# Patient Record
Sex: Male | Born: 1935 | Race: White | Hispanic: No | Marital: Married | Smoking: Never smoker
Health system: Southern US, Community
[De-identification: ages and names within clinical notes are randomized; demographics above are authoritative.]

## PROBLEM LIST (undated history)

## (undated) DIAGNOSIS — I517 Cardiomegaly: Secondary | ICD-10-CM

## (undated) DIAGNOSIS — I5181 Takotsubo syndrome: Secondary | ICD-10-CM

## (undated) DIAGNOSIS — R55 Syncope and collapse: Secondary | ICD-10-CM

## (undated) DIAGNOSIS — E7800 Pure hypercholesterolemia, unspecified: Secondary | ICD-10-CM

## (undated) DIAGNOSIS — I513 Intracardiac thrombosis, not elsewhere classified: Secondary | ICD-10-CM

## (undated) HISTORY — DX: Pure hypercholesterolemia, unspecified: E78.00

## (undated) HISTORY — DX: Cardiomegaly: I51.7

## (undated) HISTORY — DX: Syncope and collapse: R55

---

## 1998-10-25 ENCOUNTER — Ambulatory Visit (HOSPITAL_COMMUNITY): Admission: RE | Admit: 1998-10-25 | Discharge: 1998-10-25 | Payer: Self-pay | Admitting: Gastroenterology

## 2001-05-30 ENCOUNTER — Encounter: Payer: Self-pay | Admitting: Emergency Medicine

## 2001-05-30 ENCOUNTER — Emergency Department (HOSPITAL_COMMUNITY): Admission: EM | Admit: 2001-05-30 | Discharge: 2001-05-30 | Payer: Self-pay | Admitting: Emergency Medicine

## 2001-06-05 ENCOUNTER — Ambulatory Visit (HOSPITAL_COMMUNITY): Admission: RE | Admit: 2001-06-05 | Discharge: 2001-06-05 | Payer: Self-pay | Admitting: Emergency Medicine

## 2001-06-05 ENCOUNTER — Encounter: Payer: Self-pay | Admitting: Emergency Medicine

## 2001-12-22 ENCOUNTER — Ambulatory Visit (HOSPITAL_COMMUNITY): Admission: RE | Admit: 2001-12-22 | Discharge: 2001-12-22 | Payer: Self-pay | Admitting: Gastroenterology

## 2005-11-27 ENCOUNTER — Inpatient Hospital Stay (HOSPITAL_COMMUNITY): Admission: EM | Admit: 2005-11-27 | Discharge: 2005-11-30 | Payer: Self-pay | Admitting: Emergency Medicine

## 2005-11-28 ENCOUNTER — Encounter (INDEPENDENT_AMBULATORY_CARE_PROVIDER_SITE_OTHER): Payer: Self-pay | Admitting: *Deleted

## 2005-12-03 ENCOUNTER — Ambulatory Visit: Payer: Self-pay | Admitting: Internal Medicine

## 2007-01-24 ENCOUNTER — Encounter: Admission: RE | Admit: 2007-01-24 | Discharge: 2007-01-24 | Payer: Self-pay | Admitting: Gastroenterology

## 2010-06-30 NOTE — H&P (Signed)
NAMELESTON, SCHUELLER             ACCOUNT NO.:  [id number]   MEDICAL RECORD NO.:  [id number]          PATIENT TYPE:  INP   LOCATION:  1432                         FACILITY:  [facility]   PHYSICIAN:  Iva Boop, MD,FACGDATE OF BIRTH:  [birth year]   DATE OF ADMISSION:  11/27/2005  DATE OF DISCHARGE:                                HISTORY & PHYSICAL   CHIEF COMPLAINTS:  Acute upper abdominal pain x36 hours associated with  jaundice.   HISTORY:  Mr. Woolverton is a 77 year old white male,a primary patient of Dr.  Dossie Arbour, generally healthy with history of hypertension and  ureterolithiasis.  He has no prior surgeries.   The patient had acute onset on Monday, October 15, with right-sided  abdominal pain and decrease in his appetite, initially did not have any  nausea, vomiting or diarrhea, but has become nauseated today.  His wife says  that he had an episode of freezing and then sweating, but no documented  fever at home.  They have noted dark urine over the past 24 hours or so.  He  said that he had been feeling fine and in his usual state of health until  acute onset of his symptoms.  He has not been on any new medications or  antibiotics, no known exposures, etc.  He did get a flu shot interestingly  the day before his symptoms started.   In the emergency room, WBC of 11.6, hemoglobin 15.7, hematocrit of 46.2,  platelets 295, 000; pro time 14.3, INR of 1.1; potassium 4.3, BUN 12,  creatinine 1.2, total bilirubin 7, alk phos 160, OT 354, PT 687, lipase  within normal limits at 39.  Abdominal ultrasound showed a mobile stone in  the gallbladder, no ductal dilation, otherwise negative exam.  At this time  he is admitted for supportive management, pain control and further  diagnostic workup.   CURRENT MEDICATIONS:  Baby aspirin daily.   ALLERGIES:  NO KNOWN DRUG ALLERGIES.   PAST HISTORY:  Pertinent for hypertension and ureterolithiasis.   FAMILY HISTORY:  Father deceased  secondary to a brain tumor.   SOCIAL HISTORY:  The patient is married, retired, has been living in  [place] for 32 years.  He smokes an occasional cigar, EtOH -- 1 beer  daily.   REVIEW OF SYSTEMS:  CARDIOVASCULAR:  Denies any chest pain or anginal  symptoms.  PULMONARY:  Negative for cough, shortness of breath or sputum  production.  GENITOURINARY:  Pertinent for dark urine.  No dysuria or  frequency.  MUSCULOSKELETAL:  Negative.  EXTREMITIES:  No problems with  edema.  SKIN:  He has noticed yellowing of his skin, no pruritus.  NEUROLOGIC:  Negative.  All other systems are negative.   PHYSICAL EXAM:  GENERAL:  A well-developed, jaundiced white male, alert,  oriented, uncomfortable, nauseated.  VITAL SIGNS:  Blood pressure 130/71, pulse is 95, SATS 97% on room air.  Temperature is 98.8.  HEENT:  Nontraumatic, normocephalic.  EOMI.  PERLA.  Sclerae anicteric.  NECK:  Supple without nodes.  CARDIOVASCULAR:  Regular rate and rhythm with no murmur, rub or gallop.  PULMONARY:  Clear to A&P.  ABDOMEN:  Soft.  He is mildly tender in the right upper quadrant.  There is  no palpable mass or hepatosplenomegaly, no guarding.  Bowel sounds are  active.  RECTAL:  Exam is not done at this time.  SKIN:  Jaundiced.  There are no lesions or rashes.  EXTREMITIES:  No clubbing, cyanosis or edema.  NEUROLOGIC:  Grossly nonfocal.   IMPRESSION:  1. [age]-year-old white male with acute onset of right-sided      abdominal pain x36 hours with jaundice and negative ultrasound, rule      out choledocholithiasis, rule out ampullary or pancreatic lesion, rule      out acute hepatotoxicity with capsular distension, rule out      cholecystitis.  2. History of hypertension.  3. Ureterolithiasis.   PLAN:  The patient is admitted to the service of Dr. Stan Head for IV  fluid hydration, pain control and antiemetics.  We will check CT scan of the  abdomen and pelvis first and then further plans  pending CT.  If CT is  unrevealing, we will need HIDA scan.     ______________________________  Mike Gip, PA-C      Iva Boop, MD,FACG  Electronically Signed    AE/MEDQ  D:  11/28/2005  T:  11/29/2005  Job:  [id number]   cc:   Barry Dienes. Eloise Harman, M.D.  Fax: [phone]

## 2010-06-30 NOTE — Op Note (Signed)
NAMETRENELL, CONCANNON             ACCOUNT NO.:  [id number]   MEDICAL RECORD NO.:  [id number]          PATIENT TYPE:  INP   LOCATION:  1432                         FACILITY:  [facility]   PHYSICIAN:  Lebron Conners, M.D.   DATE OF BIRTH:  02-18-1930   DATE OF PROCEDURE:  11/28/2005  DATE OF DISCHARGE:                                 OPERATIVE REPORT   PREOPERATIVE DIAGNOSIS:  Cholelithiasis and possible acute cholecystitis,  possible choledocholithiasis.   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   PROCEDURE:  Laparoscopic cholecystectomy with operative cholangiogram.   SURGEON:  Lebron Conners, M.D.   ASSISTANT:  Currie Paris, M.D.   ANESTHESIA:  General and local.   SPECIMEN:  Gallbladder.   BLOOD LOSS:  About 200 mL.   COMPLICATIONS:  None.   PROCEDURE:  After the patient was monitored and asleep and had routine  preparation and draping of the abdomen, I infiltrated the area below the  umbilicus with local anesthetic, made about a 3 cm transverse incision and  then about 2 cm of midline incision and bluntly entered the peritoneal  cavity.  I placed a 0 Vicryl pursestring suture in the fascia and secured a  Hassan cannula and put in a laparoscope.  I found evidence of considerable  inflammation in the upper abdomen with a lot of adhesion of the omentum to  the undersurface of the gallbladder and with adhesion of the liver to the  anterior abdominal wall and adhesions of omentum to the undersurface of the  liver.  I first put in two 5 mm ports in the right lateral abdomen under  direct view and took down the adhesions to gain better view of the right  upper quadrant.  I then put in an 11 mm port in the epigastrium.  Dissecting  the adhesions away the gallbladder, it was noted the patient had a severe  acute cholecystitis.  I decompressed the gallbladder with a  suction  aspirator and then was able to grasp the fundus and elevated it.  Because of  the severe degree of inflammation and lack of mobility of the structures, we  needed angled viewing scope and then we had pretty good vision.  I took down  the adhesions until I found the infundibulum of the gallbladder.  It was  very inflamed and thickened and contained a large gallstone.  We were able  to grasp it and pull it laterally and I then followed it down until I saw  the cystic duct emerging from the infundibulum and I saw the cystic artery  crossing the triangle of Calot.  I clipped and divided the cystic artery and  then placed a clip on the cystic duct as it emerged from the infundibulum  and then made a small nick in it distal to that.  It was filled with  necrotic looking debris but I did not see any stones.  I put in a Cook  cholangiogram catheter and secured that with a clip but there was leakage of  the saline as I  tried an injection.  When I put one clip distally, that did  not do good and when I removed that, there was some leakage at that site.  I  then got a Reddick cholangiogram catheter and put that down a little farther  and partially inflated the balloon and put a clip above that and had a  secure seal.  I then performed a fluoroscopic cholangiogram.  The distal  common bile duct appeared normal and there was nice free flow into the  duodenum.  Proximal to the entry of the cystic duct into the common duct,  there was a smooth narrowing of the common hepatic duct immediately adjacent  to the infundibulum of the gallbladder.  I believe that the compression was  caused by severe inflammation in the stone within the gallbladder, producing  obstruction of the common hepatic duct.  I noted that as I increased  pressure, there was some distensibility of that area and that further lent  credence to that area.  After performance of the cholangiogram, I put the  laparoscope back in and  removed the clip and the cholangiogram catheter and  then clipped the distal cystic duct with three clips.  After dividing the  cystic duct, I removed the gallbladder from the liver with cautery.  It was  extremely thick-walled and somewhat difficult to get out, but we were able  to detach it and place it in a plastic pouch.  I believe a left small  portion of the posterior gallbladder wall near the fundus attached to the  liver and I cauterized that.  I cauterized bleeders in the gallbladder  fossa.  I placed a generous patch of Surgicel in the gallbladder fossa.  I  saw that the clips appeared secure.  However, since I was worried about the  integrity of the cystic duct, I decided to put in a drain and brought a 61-  Jamaica Blake drain through the lateral port site, placed it in good position  under the liver and secured it to the skin with a suture.  I then suctioned  out the remaining clots and fluid.  I removed the gallbladder through the  umbilical incision and tied the pursestring suture, then checked to make  sure I had trapped no viscera.  I removed the lateral port under direct view  and then removed the epigastric port.  The sponge, needle and instrument  counts were correct.  I closed all skin incisions with intracuticular 4-0  Vicryl and Steri-Strips.  The patient went to PACU in stable condition.      Lebron Conners, M.D.  Electronically Signed     WB/MEDQ  D:  11/28/2005  T:  11/30/2005  Job:  [id number]   cc:   Barry Dienes. Eloise Harman, M.D.  Fax: [phone]

## 2010-06-30 NOTE — Discharge Summary (Signed)
NAMESTEN, DEMATTEO NO.:  [id number]   MEDICAL RECORD NO.:  [id number]          PATIENT TYPE:  INP   LOCATION:  1432                         FACILITY:  [facility]   PHYSICIAN:  Lebron Conners, M.D.   DATE OF BIRTH:  26-Jun-1930   DATE OF ADMISSION:  11/27/2005  DATE OF DISCHARGE:  11/30/2005                                 DISCHARGE SUMMARY   HISTORY:  Mr. Vandeusen is a 77 year old man who was admitted to the hospital  by [facility] gastroenterology service because of abdominal pain and jaundice.  This was of recent onset.  He had normal clotting studies.  Bilirubin was 7,  alkaline phosphatase 160 and transaminases markedly elevated.  The lipase  was normal.  Ultrasound showed gallstones.  He was felt to have gallstones  and possible common duct stones.  On exam he had mild abdominal tenderness.   HOSPITAL COURSE:  The patient felt better with antibiotics.  I saw him on  the day after admission.  His jaundice was improving.  I recommended that he  undergo a laparoscopic cholecystectomy with cholangiogram.  Dr. Leone Payor  agreed.  The operation took place on November 28, 2005.  I found that on the  cholangiogram, there was evident extrinsic compression of the common bile  duct very likely due to impacted stone in the cystic duct (Mirizzi's  syndrome).  The laparoscopic cholecystectomy went well.  Postoperatively,  the jaundice continued to improve.  He was ready for discharge by the second  postoperative day and was sent home with arrangements made for follow-up in  the office.  He was sent home with a drain in place and was asked to return  to the office in about a week.   DIAGNOSIS:  Cholelithiasis and extrinsic common duct compression with acute  and chronic cholecystitis.   OPERATION:  Laparoscopic cholecystectomy and cholangiogram.   DISCHARGE CONDITION:  Improved.      Lebron Conners, M.D.  Electronically Signed     WB/MEDQ  D:  12/31/2005  T:  12/31/2005   Job:  [id number]   cc:   Iva Boop, MD,FACG  [facility]  [address] [place], [place] [address]

## 2010-06-30 NOTE — Op Note (Signed)
   Adam Leon, Adam Leon                         ACCOUNT NO.:  [id number]   MEDICAL RECORD NO.:  [id number]                   PATIENT TYPE:  AMB   LOCATION:  ENDO                                 [facility]AC[facility]  [facility]   PHYSICIAN:  John C. Madilyn Fireman, M.D.                 DATE OF BIRTH:  08-03-1930   DATE OF PROCEDURE:  12/22/2001  DATE OF DISCHARGE:                                 OPERATIVE REPORT   PROCEDURE:  Colonoscopy.   INDICATIONS FOR PROCEDURE:  Colon polyps on index colonoscopy three years  ago.   DESCRIPTION OF PROCEDURE:  The patient was placed in the left lateral  decubitus position then placed on the pulse monitor with continuous low flow  oxygen delivered by nasal cannula. He was sedated with 40 mg IV Demerol and  4 mg IV Versed. The Olympus video colonoscope was inserted into the rectum  and advanced to the cecum, confirmed by transillumination at McBurney's  point and visualization at the ileocecal valve and appendiceal orifice. The  prep was excellent. The cecum appeared normal with no masses, polyps,  diverticula or other mucosal abnormalities. Within the ascending, transverse  and descending colon there were seen multiple diverticula and no other  abnormalities. The rectum appeared normal and retroflexed view of the anus  revealed no obvious internal hemorrhoids. The colonoscope was then withdrawn  and the patient returned to the recovery room in stable condition. The  patient tolerated the procedure well and there were no immediate  complications.   IMPRESSION:  Diverticulosis, otherwise, normal colonoscopy.   PLAN:  Repeat colonoscopy based on his history of polyps in five years.                                                John C. Madilyn Fireman, M.D.    JCH/MEDQ  D:  12/22/2001  T:  12/22/2001  Job:  [id number]

## 2011-06-11 ENCOUNTER — Other Ambulatory Visit (HOSPITAL_COMMUNITY): Payer: Self-pay | Admitting: Internal Medicine

## 2011-06-11 DIAGNOSIS — R079 Chest pain, unspecified: Secondary | ICD-10-CM

## 2011-06-12 ENCOUNTER — Encounter (HOSPITAL_COMMUNITY): Payer: Self-pay

## 2011-06-18 ENCOUNTER — Encounter: Payer: Self-pay | Admitting: Physician Assistant

## 2011-06-18 ENCOUNTER — Ambulatory Visit (INDEPENDENT_AMBULATORY_CARE_PROVIDER_SITE_OTHER): Payer: Medicare Other | Admitting: Physician Assistant

## 2011-06-18 DIAGNOSIS — R079 Chest pain, unspecified: Secondary | ICD-10-CM

## 2011-06-18 NOTE — Procedures (Signed)
Exercise Treadmill Test  Pre-Exercise Testing Evaluation Rhythm: normal sinus  Rate: 58   PR:  .17 QRS:  .10  QT:  .41 QTc: .40     Test  Exercise Tolerance Test Ordering MD: Ivery Quale  Interpreting MD: Jacolyn Reedy PA-C  Unique Test No: 1  Treadmill:  1  Indication for ETT: chest pain - rule out ischemia  Contraindication to ETT: No   Stress Modality: exercise - treadmill  Cardiac Imaging Performed: non   Protocol: standard Bruce - maximal  Max BP:  171/80  Max MPHR (bpm):  142 85% MPR (bpm):  120  MPHR obtained (bpm):  121 % MPHR obtained:  85  Reached 85% MPHR (min:sec):  4:00 Total Exercise Time (min-sec):  4:10  Workload in METS:  5.9 Borg Scale: 17  Reason ETT Terminated:  legs hurt    ST Segment Analysis At Rest: normal ST segments - no evidence of significant ST depression With Exercise: no evidence of significant ST depression  Other Information Arrhythmia:  No Angina during ETT:  absent (0) Quality of ETT:  diagnostic  ETT Interpretation:  normal - no evidence of ischemia by ST analysis  Comments: Poor exercise tolerance. Exercised 4:10, stopped due to leg pain, fatigue. No chest pain or EKG changes.  Recommendations: Follow up with Dr. Jarold Motto

## 2011-12-17 ENCOUNTER — Encounter: Payer: Self-pay | Admitting: Cardiovascular Disease

## 2012-04-01 ENCOUNTER — Encounter: Payer: Self-pay | Admitting: Cardiovascular Disease

## 2014-11-19 DIAGNOSIS — Z23 Encounter for immunization: Secondary | ICD-10-CM | POA: Diagnosis not present

## 2015-02-23 DIAGNOSIS — R69 Illness, unspecified: Secondary | ICD-10-CM | POA: Diagnosis not present

## 2015-06-27 DIAGNOSIS — E1151 Type 2 diabetes mellitus with diabetic peripheral angiopathy without gangrene: Secondary | ICD-10-CM | POA: Diagnosis not present

## 2015-06-27 DIAGNOSIS — Z125 Encounter for screening for malignant neoplasm of prostate: Secondary | ICD-10-CM | POA: Diagnosis not present

## 2015-06-27 DIAGNOSIS — I10 Essential (primary) hypertension: Secondary | ICD-10-CM | POA: Diagnosis not present

## 2015-06-27 DIAGNOSIS — E784 Other hyperlipidemia: Secondary | ICD-10-CM | POA: Diagnosis not present

## 2015-07-04 DIAGNOSIS — Z1389 Encounter for screening for other disorder: Secondary | ICD-10-CM | POA: Diagnosis not present

## 2015-07-04 DIAGNOSIS — E1151 Type 2 diabetes mellitus with diabetic peripheral angiopathy without gangrene: Secondary | ICD-10-CM | POA: Diagnosis not present

## 2015-07-04 DIAGNOSIS — Z0000 Encounter for general adult medical examination without abnormal findings: Secondary | ICD-10-CM | POA: Diagnosis not present

## 2015-07-04 DIAGNOSIS — I7389 Other specified peripheral vascular diseases: Secondary | ICD-10-CM | POA: Diagnosis not present

## 2015-07-04 DIAGNOSIS — E784 Other hyperlipidemia: Secondary | ICD-10-CM | POA: Diagnosis not present

## 2015-07-04 DIAGNOSIS — R351 Nocturia: Secondary | ICD-10-CM | POA: Diagnosis not present

## 2015-07-04 DIAGNOSIS — I10 Essential (primary) hypertension: Secondary | ICD-10-CM | POA: Diagnosis not present

## 2015-07-04 DIAGNOSIS — Z6821 Body mass index (BMI) 21.0-21.9, adult: Secondary | ICD-10-CM | POA: Diagnosis not present

## 2015-09-06 DIAGNOSIS — R69 Illness, unspecified: Secondary | ICD-10-CM | POA: Diagnosis not present

## 2015-12-22 DIAGNOSIS — R69 Illness, unspecified: Secondary | ICD-10-CM | POA: Diagnosis not present

## 2016-02-02 DIAGNOSIS — Z0000 Encounter for general adult medical examination without abnormal findings: Secondary | ICD-10-CM | POA: Diagnosis not present

## 2016-02-02 DIAGNOSIS — E7800 Pure hypercholesterolemia, unspecified: Secondary | ICD-10-CM | POA: Diagnosis not present

## 2016-02-02 DIAGNOSIS — E119 Type 2 diabetes mellitus without complications: Secondary | ICD-10-CM | POA: Diagnosis not present

## 2016-02-02 DIAGNOSIS — Z6822 Body mass index (BMI) 22.0-22.9, adult: Secondary | ICD-10-CM | POA: Diagnosis not present

## 2016-02-02 DIAGNOSIS — I10 Essential (primary) hypertension: Secondary | ICD-10-CM | POA: Diagnosis not present

## 2016-03-13 DIAGNOSIS — R69 Illness, unspecified: Secondary | ICD-10-CM | POA: Diagnosis not present

## 2016-07-03 DIAGNOSIS — I10 Essential (primary) hypertension: Secondary | ICD-10-CM | POA: Diagnosis not present

## 2016-07-03 DIAGNOSIS — E784 Other hyperlipidemia: Secondary | ICD-10-CM | POA: Diagnosis not present

## 2016-07-03 DIAGNOSIS — Z125 Encounter for screening for malignant neoplasm of prostate: Secondary | ICD-10-CM | POA: Diagnosis not present

## 2016-07-03 DIAGNOSIS — E1151 Type 2 diabetes mellitus with diabetic peripheral angiopathy without gangrene: Secondary | ICD-10-CM | POA: Diagnosis not present

## 2016-07-10 DIAGNOSIS — H6121 Impacted cerumen, right ear: Secondary | ICD-10-CM | POA: Diagnosis not present

## 2016-07-10 DIAGNOSIS — E784 Other hyperlipidemia: Secondary | ICD-10-CM | POA: Diagnosis not present

## 2016-07-10 DIAGNOSIS — I7389 Other specified peripheral vascular diseases: Secondary | ICD-10-CM | POA: Diagnosis not present

## 2016-07-10 DIAGNOSIS — I10 Essential (primary) hypertension: Secondary | ICD-10-CM | POA: Diagnosis not present

## 2016-07-10 DIAGNOSIS — E1151 Type 2 diabetes mellitus with diabetic peripheral angiopathy without gangrene: Secondary | ICD-10-CM | POA: Diagnosis not present

## 2016-07-10 DIAGNOSIS — Z6821 Body mass index (BMI) 21.0-21.9, adult: Secondary | ICD-10-CM | POA: Diagnosis not present

## 2016-07-10 DIAGNOSIS — Z0000 Encounter for general adult medical examination without abnormal findings: Secondary | ICD-10-CM | POA: Diagnosis not present

## 2016-07-10 DIAGNOSIS — N528 Other male erectile dysfunction: Secondary | ICD-10-CM | POA: Diagnosis not present

## 2016-07-10 DIAGNOSIS — Z1389 Encounter for screening for other disorder: Secondary | ICD-10-CM | POA: Diagnosis not present

## 2016-11-27 DIAGNOSIS — Z23 Encounter for immunization: Secondary | ICD-10-CM | POA: Diagnosis not present

## 2017-02-28 DIAGNOSIS — Z7984 Long term (current) use of oral hypoglycemic drugs: Secondary | ICD-10-CM | POA: Diagnosis not present

## 2017-02-28 DIAGNOSIS — Z823 Family history of stroke: Secondary | ICD-10-CM | POA: Diagnosis not present

## 2017-02-28 DIAGNOSIS — G8929 Other chronic pain: Secondary | ICD-10-CM | POA: Diagnosis not present

## 2017-02-28 DIAGNOSIS — E1151 Type 2 diabetes mellitus with diabetic peripheral angiopathy without gangrene: Secondary | ICD-10-CM | POA: Diagnosis not present

## 2017-02-28 DIAGNOSIS — I10 Essential (primary) hypertension: Secondary | ICD-10-CM | POA: Diagnosis not present

## 2017-02-28 DIAGNOSIS — E785 Hyperlipidemia, unspecified: Secondary | ICD-10-CM | POA: Diagnosis not present

## 2017-04-17 DIAGNOSIS — R69 Illness, unspecified: Secondary | ICD-10-CM | POA: Diagnosis not present

## 2017-07-09 DIAGNOSIS — Z125 Encounter for screening for malignant neoplasm of prostate: Secondary | ICD-10-CM | POA: Diagnosis not present

## 2017-07-09 DIAGNOSIS — E7849 Other hyperlipidemia: Secondary | ICD-10-CM | POA: Diagnosis not present

## 2017-07-09 DIAGNOSIS — I10 Essential (primary) hypertension: Secondary | ICD-10-CM | POA: Diagnosis not present

## 2017-07-09 DIAGNOSIS — R82998 Other abnormal findings in urine: Secondary | ICD-10-CM | POA: Diagnosis not present

## 2017-07-09 DIAGNOSIS — E1151 Type 2 diabetes mellitus with diabetic peripheral angiopathy without gangrene: Secondary | ICD-10-CM | POA: Diagnosis not present

## 2017-07-16 DIAGNOSIS — E7849 Other hyperlipidemia: Secondary | ICD-10-CM | POA: Diagnosis not present

## 2017-07-16 DIAGNOSIS — M545 Low back pain: Secondary | ICD-10-CM | POA: Diagnosis not present

## 2017-07-16 DIAGNOSIS — Z1389 Encounter for screening for other disorder: Secondary | ICD-10-CM | POA: Diagnosis not present

## 2017-07-16 DIAGNOSIS — Z0000 Encounter for general adult medical examination without abnormal findings: Secondary | ICD-10-CM | POA: Diagnosis not present

## 2017-07-16 DIAGNOSIS — I10 Essential (primary) hypertension: Secondary | ICD-10-CM | POA: Diagnosis not present

## 2017-07-16 DIAGNOSIS — I7389 Other specified peripheral vascular diseases: Secondary | ICD-10-CM | POA: Diagnosis not present

## 2017-07-16 DIAGNOSIS — Z6822 Body mass index (BMI) 22.0-22.9, adult: Secondary | ICD-10-CM | POA: Diagnosis not present

## 2017-07-16 DIAGNOSIS — E1151 Type 2 diabetes mellitus with diabetic peripheral angiopathy without gangrene: Secondary | ICD-10-CM | POA: Diagnosis not present

## 2017-11-04 DIAGNOSIS — R69 Illness, unspecified: Secondary | ICD-10-CM | POA: Diagnosis not present

## 2017-12-11 ENCOUNTER — Encounter (HOSPITAL_COMMUNITY): Payer: Self-pay | Admitting: Emergency Medicine

## 2017-12-11 ENCOUNTER — Other Ambulatory Visit: Payer: Self-pay

## 2017-12-11 ENCOUNTER — Emergency Department (HOSPITAL_COMMUNITY): Payer: Medicare HMO

## 2017-12-11 ENCOUNTER — Emergency Department (HOSPITAL_COMMUNITY)
Admission: EM | Admit: 2017-12-11 | Discharge: 2017-12-11 | Disposition: A | Discharge status: Home / Self Care | Payer: Medicare HMO | Attending: Emergency Medicine | Admitting: Emergency Medicine

## 2017-12-11 DIAGNOSIS — R531 Weakness: Secondary | ICD-10-CM | POA: Diagnosis not present

## 2017-12-11 DIAGNOSIS — Z7982 Long term (current) use of aspirin: Secondary | ICD-10-CM | POA: Insufficient documentation

## 2017-12-11 DIAGNOSIS — R42 Dizziness and giddiness: Secondary | ICD-10-CM | POA: Insufficient documentation

## 2017-12-11 DIAGNOSIS — Z7984 Long term (current) use of oral hypoglycemic drugs: Secondary | ICD-10-CM | POA: Diagnosis not present

## 2017-12-11 DIAGNOSIS — R202 Paresthesia of skin: Secondary | ICD-10-CM | POA: Diagnosis not present

## 2017-12-11 DIAGNOSIS — R4020 Unspecified coma: Secondary | ICD-10-CM | POA: Diagnosis not present

## 2017-12-11 DIAGNOSIS — Z79899 Other long term (current) drug therapy: Secondary | ICD-10-CM | POA: Diagnosis not present

## 2017-12-11 LAB — BASIC METABOLIC PANEL
ANION GAP: 7 (ref 5–15)
BUN: 16 mg/dL (ref 8–23)
CO2: 26 mmol/L (ref 22–32)
CREATININE: 1.28 mg/dL — AB (ref 0.61–1.24)
Calcium: 9.6 mg/dL (ref 8.9–10.3)
Chloride: 104 mmol/L (ref 98–111)
GFR, EST AFRICAN AMERICAN: 57 mL/min — AB (ref >60)
GFR, EST NON AFRICAN AMERICAN: 49 mL/min — AB (ref >60)
GLUCOSE: 224 mg/dL — AB (ref 70–99)
Potassium: 4.3 mmol/L (ref 3.5–5.1)
Sodium: 137 mmol/L (ref 135–145)

## 2017-12-11 LAB — CBC
HCT: 46 % (ref 39.0–52.0)
Hemoglobin: 14.6 g/dL (ref 13.0–17.0)
MCH: 27.1 pg (ref 26.0–34.0)
MCHC: 31.7 g/dL (ref 30.0–36.0)
MCV: 85.3 fL (ref 80.0–100.0)
Platelets: 322 10*3/uL (ref 150–400)
RBC: 5.39 MIL/uL (ref 4.22–5.81)
RDW: 12.9 % (ref 11.5–15.5)
WBC: 11.7 10*3/uL — ABNORMAL HIGH (ref 4.0–10.5)
nRBC: 0 % (ref 0.0–0.2)

## 2017-12-11 LAB — TROPONIN I: Troponin I: <0.03 ng/mL (ref <0.03)

## 2017-12-11 NOTE — Discharge Instructions (Addendum)
Follow-up with your doctor next week to be rechecked, return to the emergency room if you have any recurrent episodes

## 2017-12-11 NOTE — ED Provider Notes (Signed)
[facility] EMERGENCY DEPARTMENT Provider Note   CSN: [id number] Arrival date & time: 12/11/17  1226     History   Chief Complaint Chief Complaint  Patient presents with  . Dizziness    HPI Adam Leon is a 84 y.o. male.  HPI Pt was at home this morning.  He had already been up walking around for breakfast.  Patient states he went to the bathroom.  After sitting down and going to get up he noticed that his feet felt numb.  Pt went back on the couch when he continue to feel like his feet were tingling and he couldn't stand up.  EMS was called.  This lasted for approximately 30 minutes.  Right now all of his symptoms have resolved.   No trouble with speech, arms,.  No headache.   Past Medical History:  Diagnosis Date  . Hypercholesteremia   . Syncope   . Syncope   . Ventricular hypertrophy     There are no active problems to display for this patient.   History reviewed. No pertinent surgical history.      Home Medications    Prior to Admission medications   Medication Sig Start Date End Date Taking? Authorizing Provider  aspirin 81 MG tablet Take 81 mg by mouth daily.   Yes [provider]  atenolol (TENORMIN) 25 MG tablet Take 25 mg by mouth daily. 09/17/17  Yes [provider]  lisinopril (PRINIVIL,ZESTRIL) 5 MG tablet Take 5 mg by mouth daily. 09/17/17  Yes [provider]  metFORMIN (GLUCOPHAGE-XR) 500 MG 24 hr tablet Take 500 mg by mouth 2 (two) times daily. 09/17/17  Yes [provider]  pravastatin (PRAVACHOL) 40 MG tablet Take 40 mg by mouth daily. 09/17/17  Yes [provider]    Family History No family history on file.  Social History Social History   Tobacco Use  . Smoking status: Not on file  Substance Use Topics  . Alcohol use: Not on file  . Drug use: Not on file     Allergies   Patient has no known allergies.   Review of Systems Review of Systems  Constitutional: Negative  for fever.  Respiratory: Negative for shortness of breath.   Cardiovascular: Negative for chest pain.  Genitourinary: Negative for dysuria.  Neurological: Positive for light-headedness.  All other systems reviewed and are negative.    Physical Exam Updated Vital Signs BP (!) 128/57   Pulse 70   Temp 98.2 F (36.8 C) (Oral)   Resp 19   SpO2 97%   Physical Exam  Constitutional: He is oriented to person, place, and time. He appears well-developed and well-nourished. No distress.  HENT:  Head: Normocephalic and atraumatic.  Right Ear: External ear normal.  Left Ear: External ear normal.  Mouth/Throat: Oropharynx is clear and moist.  Eyes: Conjunctivae are normal. Right eye exhibits no discharge. Left eye exhibits no discharge. No scleral icterus.  Neck: Neck supple. No tracheal deviation present.  Cardiovascular: Normal rate, regular rhythm and intact distal pulses.  Pulmonary/Chest: Effort normal and breath sounds normal. No stridor. No respiratory distress. He has no wheezes. He has no rales.  Abdominal: Soft. Bowel sounds are normal. He exhibits no distension. There is no tenderness. There is no rebound and no guarding.  Musculoskeletal: He exhibits no edema or tenderness.  Normal dorsalis pedis pulse bilaterally, extremities are warm and well-perfused  Neurological: He is alert and oriented to person, place, and time. He has normal  strength. No cranial nerve deficit (no facial droop, extraocular movements intact, no slurred speech) or sensory deficit. He exhibits normal muscle tone. He displays no seizure activity. Coordination normal.  No pronator drift bilateral upper extrem, able to hold both legs off bed for 5 seconds, sensation intact in all extremities, no visual field cuts, no left or right sided neglect, normal finger-nose exam bilaterally, no nystagmus noted   Skin: Skin is warm and dry. No rash noted.  Psychiatric: He has a normal mood and affect.  Nursing note and  vitals reviewed.    ED Treatments / Results  Labs (all labs ordered are listed, but only abnormal results are displayed) Labs Reviewed  BASIC METABOLIC PANEL - Abnormal; Notable for the following components:      Result Value   Glucose, Bld 224 (*)    Creatinine, Ser 1.28 (*)    GFR calc non Af Amer 49 (*)    GFR calc Af Amer 57 (*)    All other components within normal limits  CBC - Abnormal; Notable for the following components:   WBC 11.7 (*)    All other components within normal limits  TROPONIN I    EKG EKG Interpretation  Date/Time:  Wednesday December 11 2017 12:28:32 EDT Ventricular Rate:  78 PR Interval:  156 QRS Duration: 96 QT Interval:  396 QTC Calculation: 451 R Axis:   52 Text Interpretation:  Normal sinus rhythm Possible Inferior infarct , age undetermined Abnormal ECG No previous tracing Confirmed by Linwood Dibbles [phone]) on 12/11/2017 1:30:11 PM Also confirmed by Linwood Dibbles [phone]), editor Sheppard Evens ([id number])  on 12/11/2017 2:28:42 PM   Radiology Ct Head Wo Contrast  Result Date: 12/11/2017 CLINICAL DATA:  Altered level of consciousness with tingling in both feet starting at 11 a.m. EXAM: CT HEAD WITHOUT CONTRAST TECHNIQUE: Contiguous axial images were obtained from the base of the skull through the vertex without intravenous contrast. COMPARISON:  05/30/2001 report FINDINGS: BRAIN: There is sulcal and ventricular prominence consistent with superficial and central atrophy. No intraparenchymal hemorrhage, mass effect nor midline shift. Periventricular and subcortical white matter hypodensities consistent with chronic small vessel ischemic disease are identified. No acute large vascular territory infarcts. No abnormal extra-axial fluid collections. Basal cisterns are not effaced and midline. VASCULAR: Moderate calcific atherosclerosis of the carotid siphons. SKULL: No skull fracture. No significant scalp soft tissue swelling. Previously described sclerosis of the  frontal bone without aggressive features. This was previously described in 2003 and is more likely to represent a benign finding. SINUSES/ORBITS: The mastoid air-cells are clear. Near complete opacification of the left maxillary sinus with inspissated mucus. Anterior ethmoid sinus and left frontal sinus mucosal thickening are also identified. Findings likely represent obstruction of the ostiomeatal unit complex on the left. Included ocular globes and orbital contents are non-suspicious. OTHER: None. IMPRESSION: 1. Atrophy without acute intracranial abnormality. 2. 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 Electronically Signed   By: Tollie Eth M.D.   On: 12/11/2017 14:29    Procedures Procedures (including critical care time)  Medications Ordered in ED Medications - No data to display   Initial Impression / Assessment and Plan / ED Course  I have reviewed the triage vital signs and the nursing notes.  Pertinent labs & imaging results that were available during my care of the patient were reviewed by me and considered in my medical decision making (see chart for details).   Patient  presented to the emergency room with complaints of numbness in his feet.  Patient initially indicated that he had trouble moving his feet although on questioning it sounds like he had significant numbness but he still was able to move his feet.  Patient's ED work-up is reassuring.  Labs and CT scan are normal.  He does not have any back pain.  He does not have any vascular compromise.  I doubt TIA or stroke with the bilateral symptoms.  Patient has been able to walk around the emergency room without any difficulty.  At this point I suspect he may have had some peripheral neuropathy from sitting on the commode.  At this time there does not appear to be any evidence of an acute emergency medical condition and the patient appears stable  for discharge with appropriate outpatient follow up.   Final Clinical Impressions(s) / ED Diagnoses   Final diagnoses:  Paresthesia    ED Discharge Orders    None       Linwood Dibbles, MD 12/11/17 1640

## 2017-12-11 NOTE — ED Notes (Signed)
Lab reports adding troponin to previous blood draw.

## 2017-12-11 NOTE — ED Triage Notes (Signed)
Pt states about 1 hour he began feeling "off" and felt like something was wrong- pt having a hard time explaining the episode-pt states when he began having this feeling he walked into his room and laid down and ask his wife to call ems. On arrival to ED pt states he feels normal, denies any pain. Pt is alert and 0x4. Stroke screen negative. Speech clear. No weakness.

## 2017-12-11 NOTE — ED Notes (Signed)
ED Provider at bedside. 

## 2017-12-11 NOTE — ED Notes (Signed)
Patient transported to CT 

## 2018-07-15 DIAGNOSIS — I10 Essential (primary) hypertension: Secondary | ICD-10-CM | POA: Diagnosis not present

## 2018-07-15 DIAGNOSIS — Z125 Encounter for screening for malignant neoplasm of prostate: Secondary | ICD-10-CM | POA: Diagnosis not present

## 2018-07-15 DIAGNOSIS — R82998 Other abnormal findings in urine: Secondary | ICD-10-CM | POA: Diagnosis not present

## 2018-07-15 DIAGNOSIS — E7849 Other hyperlipidemia: Secondary | ICD-10-CM | POA: Diagnosis not present

## 2018-07-15 DIAGNOSIS — E1151 Type 2 diabetes mellitus with diabetic peripheral angiopathy without gangrene: Secondary | ICD-10-CM | POA: Diagnosis not present

## 2018-07-22 DIAGNOSIS — I739 Peripheral vascular disease, unspecified: Secondary | ICD-10-CM | POA: Diagnosis not present

## 2018-07-22 DIAGNOSIS — E785 Hyperlipidemia, unspecified: Secondary | ICD-10-CM | POA: Diagnosis not present

## 2018-07-22 DIAGNOSIS — H9190 Unspecified hearing loss, unspecified ear: Secondary | ICD-10-CM | POA: Diagnosis not present

## 2018-07-22 DIAGNOSIS — Z1331 Encounter for screening for depression: Secondary | ICD-10-CM | POA: Diagnosis not present

## 2018-07-22 DIAGNOSIS — Z1339 Encounter for screening examination for other mental health and behavioral disorders: Secondary | ICD-10-CM | POA: Diagnosis not present

## 2018-07-22 DIAGNOSIS — I10 Essential (primary) hypertension: Secondary | ICD-10-CM | POA: Diagnosis not present

## 2018-07-22 DIAGNOSIS — Z0000 Encounter for general adult medical examination without abnormal findings: Secondary | ICD-10-CM | POA: Diagnosis not present

## 2018-07-22 DIAGNOSIS — E1151 Type 2 diabetes mellitus with diabetic peripheral angiopathy without gangrene: Secondary | ICD-10-CM | POA: Diagnosis not present

## 2018-12-02 DIAGNOSIS — R69 Illness, unspecified: Secondary | ICD-10-CM | POA: Diagnosis not present

## 2019-01-15 ENCOUNTER — Other Ambulatory Visit: Payer: Self-pay

## 2019-01-15 ENCOUNTER — Emergency Department (HOSPITAL_COMMUNITY): Payer: Medicare HMO

## 2019-01-15 ENCOUNTER — Encounter (HOSPITAL_COMMUNITY): Payer: Self-pay

## 2019-01-15 ENCOUNTER — Emergency Department (HOSPITAL_COMMUNITY)
Admission: EM | Admit: 2019-01-15 | Discharge: 2019-01-15 | Disposition: A | Discharge status: Home / Self Care | Payer: Medicare HMO | Attending: Emergency Medicine | Admitting: Emergency Medicine

## 2019-01-15 DIAGNOSIS — Z20828 Contact with and (suspected) exposure to other viral communicable diseases: Secondary | ICD-10-CM | POA: Diagnosis not present

## 2019-01-15 DIAGNOSIS — R630 Anorexia: Secondary | ICD-10-CM | POA: Insufficient documentation

## 2019-01-15 DIAGNOSIS — E119 Type 2 diabetes mellitus without complications: Secondary | ICD-10-CM | POA: Insufficient documentation

## 2019-01-15 DIAGNOSIS — R509 Fever, unspecified: Secondary | ICD-10-CM | POA: Diagnosis not present

## 2019-01-15 DIAGNOSIS — R5383 Other fatigue: Secondary | ICD-10-CM | POA: Insufficient documentation

## 2019-01-15 DIAGNOSIS — Z7984 Long term (current) use of oral hypoglycemic drugs: Secondary | ICD-10-CM | POA: Insufficient documentation

## 2019-01-15 DIAGNOSIS — Z7982 Long term (current) use of aspirin: Secondary | ICD-10-CM | POA: Diagnosis not present

## 2019-01-15 LAB — URINALYSIS, ROUTINE W REFLEX MICROSCOPIC
Bilirubin Urine: NEGATIVE
Glucose, UA: 50 mg/dL — AB
Hgb urine dipstick: NEGATIVE
Ketones, ur: 5 mg/dL — AB
Leukocytes,Ua: NEGATIVE
Nitrite: NEGATIVE
Protein, ur: 30 mg/dL — AB
Specific Gravity, Urine: 1.021 (ref 1.005–1.030)
pH: 5 (ref 5.0–8.0)

## 2019-01-15 LAB — CBC WITH DIFFERENTIAL/PLATELET
Abs Immature Granulocytes: 0.03 K/uL (ref 0.00–0.07)
Basophils Absolute: 0 K/uL (ref 0.0–0.1)
Basophils Relative: 0 %
Eosinophils Absolute: 0 K/uL (ref 0.0–0.5)
Eosinophils Relative: 0 %
HCT: 42.3 % (ref 39.0–52.0)
Hemoglobin: 13.7 g/dL (ref 13.0–17.0)
Immature Granulocytes: 1 %
Lymphocytes Relative: 17 %
Lymphs Abs: 0.6 K/uL — ABNORMAL LOW (ref 0.7–4.0)
MCH: 27.7 pg (ref 26.0–34.0)
MCHC: 32.4 g/dL (ref 30.0–36.0)
MCV: 85.5 fL (ref 80.0–100.0)
Monocytes Absolute: 0.5 K/uL (ref 0.1–1.0)
Monocytes Relative: 15 %
Neutro Abs: 2.2 K/uL (ref 1.7–7.7)
Neutrophils Relative %: 67 %
Platelets: 222 K/uL (ref 150–400)
RBC: 4.95 MIL/uL (ref 4.22–5.81)
RDW: 12.7 % (ref 11.5–15.5)
WBC: 3.3 K/uL — ABNORMAL LOW (ref 4.0–10.5)
nRBC: 0 % (ref 0.0–0.2)

## 2019-01-15 LAB — COMPREHENSIVE METABOLIC PANEL
ALT: 19 U/L (ref 0–44)
AST: 24 U/L (ref 15–41)
Albumin: 3.7 g/dL (ref 3.5–5.0)
Alkaline Phosphatase: 99 U/L (ref 38–126)
Anion gap: 13 (ref 5–15)
BUN: 14 mg/dL (ref 8–23)
CO2: 23 mmol/L (ref 22–32)
Calcium: 8.9 mg/dL (ref 8.9–10.3)
Chloride: 101 mmol/L (ref 98–111)
Creatinine, Ser: 1.09 mg/dL (ref 0.61–1.24)
GFR calc Af Amer: >60 mL/min (ref >60)
GFR calc non Af Amer: >60 mL/min (ref >60)
Glucose, Bld: 171 mg/dL — ABNORMAL HIGH (ref 70–99)
Potassium: 3.5 mmol/L (ref 3.5–5.1)
Sodium: 137 mmol/L (ref 135–145)
Total Bilirubin: 0.7 mg/dL (ref 0.3–1.2)
Total Protein: 6.9 g/dL (ref 6.5–8.1)

## 2019-01-15 NOTE — ED Provider Notes (Signed)
[facility] DEPT Provider Note   CSN: [id number] Arrival date & time: 01/15/19  1448     History   Chief Complaint Chief Complaint  Patient presents with  . Fatigue  . Decreased appetite    HPI Adam Leon is a 85 y.o. male presenting to the emergency department with complaint of poor p.o. intake and decreased activity level over the last 3 days.  His daughter seems most concerned with his symptoms.  She states he has eaten small meals if any throughout the day and will.  She states he is sitting on the couch for extended periods of time and seems to have increased fatigue from baseline.  Patient states he feels well and has no complaints.  He states "I eat enough to live."  Denies cough, fever, abdominal pain, urinary symptoms.     The history is provided by the patient.    Past Medical History:  Diagnosis Date  . Hypercholesteremia   . Syncope   . Syncope   . Ventricular hypertrophy     There are no active problems to display for this patient.   No past surgical history on file.      Home Medications    Prior to Admission medications   Medication Sig Start Date End Date Taking? Authorizing Provider  aspirin 81 MG tablet Take 81 mg by mouth daily.   Yes [provider]  atenolol (TENORMIN) 25 MG tablet Take 25 mg by mouth daily. 09/17/17  Yes [provider]  lisinopril (PRINIVIL,ZESTRIL) 5 MG tablet Take 5 mg by mouth daily. 09/17/17  Yes [provider]  metFORMIN (GLUCOPHAGE-XR) 500 MG 24 hr tablet Take 500 mg by mouth 2 (two) times daily. 09/17/17  Yes [provider]  pravastatin (PRAVACHOL) 40 MG tablet Take 40 mg by mouth daily. 09/17/17  Yes [provider]    Family History No family history on file.  Social History Social History   Tobacco Use  . Smoking status: Never Smoker  . Smokeless tobacco: Never Used  Substance Use Topics  . Alcohol use: Not on file  . Drug use: Not  on file     Allergies   Patient has no known allergies.   Review of Systems Review of Systems  All other systems reviewed and are negative.    Physical Exam Updated Vital Signs BP 137/68   Pulse 73   Temp 100.3 F (37.9 C) (Rectal)   Resp 20   SpO2 100%   Physical Exam Vitals signs and nursing note reviewed.  Constitutional:      General: He is not in acute distress.    Appearance: He is well-developed. He is not ill-appearing.  HENT:     Head: Normocephalic and atraumatic.  Eyes:     Conjunctiva/sclera: Conjunctivae normal.  Cardiovascular:     Rate and Rhythm: Normal rate and regular rhythm.  Pulmonary:     Effort: Pulmonary effort is normal. No respiratory distress.     Breath sounds: Normal breath sounds.  Abdominal:     General: Bowel sounds are normal.     Palpations: Abdomen is soft.     Tenderness: There is no abdominal tenderness.  Skin:    General: Skin is warm.  Neurological:     Mental Status: He is alert.  Psychiatric:        Behavior: Behavior normal.      ED Treatments / Results  Labs (all labs ordered are listed, but only abnormal results  are displayed) Labs Reviewed  COMPREHENSIVE METABOLIC PANEL - Abnormal; Notable for the following components:      Result Value   Glucose, Bld 171 (*)    All other components within normal limits  CBC WITH DIFFERENTIAL/PLATELET - Abnormal; Notable for the following components:   WBC 3.3 (*)    Lymphs Abs 0.6 (*)    All other components within normal limits  URINALYSIS, ROUTINE W REFLEX MICROSCOPIC - Abnormal; Notable for the following components:   Glucose, UA 50 (*)    Ketones, ur 5 (*)    Protein, ur 30 (*)    Bacteria, UA RARE (*)    All other components within normal limits    EKG None  Radiology Dg Chest Port 1 View  Result Date: 01/15/2019 CLINICAL DATA:  85 year old male with fever. EXAM: PORTABLE CHEST 1 VIEW COMPARISON:  None. FINDINGS: Background of mild emphysema. No focal  consolidation, pleural effusion, or pneumothorax. The cardiac silhouette is within normal limits. No acute osseous pathology. IMPRESSION: No active disease. Electronically Signed   By: Elgie Collard M.D.   On: 01/15/2019 18:17    Procedures Procedures (including critical care time)  Medications Ordered in ED Medications - No data to display   Initial Impression / Assessment and Plan / ED Course  I have reviewed the triage vital signs and the nursing notes.  Pertinent labs & imaging results that were available during my care of the patient were reviewed by me and considered in my medical decision making (see chart for details).        Patient presenting with reported decreased appetite and fatigue over the last few days per his daughter, however patient states he feels well and is asymptomatic.  On exam he is well-appearing in no distress.  He does have low-grade fever, however stable vital signs.  Heart and lung sounds are normal, abdomen is benign.  Screening labs obtained reveal slight leukopenia with white count of 3.3, no electrolyte derangements.  UA is negative for infection.  CT chest x-ray is negative for pneumonia.  Given patient's low-grade fever and slight leukopenia, will recommend Covid test at discharge.  Instructed close outpatient follow-up and return if symptoms worsen.  Patient discussed with and evaluated by Dr. Pilar Plate.  Discussed results, findings, treatment and follow up. Patient advised of return precautions. Patient verbalized understanding and agreed with plan.   Final Clinical Impressions(s) / ED Diagnoses   Final diagnoses:  None    ED Discharge Orders    None       Robinson, [place] N, PA-C 01/15/19 2107    Sabas Sous, MD 01/16/19 1500

## 2019-01-15 NOTE — ED Notes (Signed)
Family at bedside. 

## 2019-01-15 NOTE — ED Notes (Signed)
Pt denies any complaints at this time.  Pt reports that he came to ED today bc family insisted he did.

## 2019-01-15 NOTE — ED Notes (Signed)
Pt was verbalized discharge instructions. Pt had no further questions at this time. NAD. 

## 2019-01-15 NOTE — Discharge Instructions (Addendum)
Please follow-up closely with your primary care provider regarding your visit today. Please self isolate at home until you know your Covid test results.  If your results are positive, you will be contacted and directed of further isolation precautions. Return to the emergency department for new or worsening symptoms.

## 2019-01-15 NOTE — ED Notes (Signed)
Pt ambulatory from triage 

## 2019-01-15 NOTE — ED Triage Notes (Signed)
Pt presents with c/o fatigue and decreased appetite. Per family at bedside, he hasn't been eating well for the past 3 days, has been sleeping a lot, and just doesn't feel well. Pt does appear fatigued.

## 2019-01-15 NOTE — ED Notes (Signed)
Advised pt that a urine sample was needed. Also advised daughter that if he notifies her of his need to urinate, to use call bell and we will come in and assist him with using urinal.  Daughter agreed. Provided pt with water and advised I will check back.

## 2019-01-15 NOTE — ED Notes (Signed)
Patient made aware urine sample is needed. 

## 2019-01-17 DIAGNOSIS — R404 Transient alteration of awareness: Secondary | ICD-10-CM | POA: Diagnosis not present

## 2019-01-17 DIAGNOSIS — E1165 Type 2 diabetes mellitus with hyperglycemia: Secondary | ICD-10-CM | POA: Diagnosis not present

## 2019-01-17 DIAGNOSIS — R531 Weakness: Secondary | ICD-10-CM | POA: Diagnosis not present

## 2019-01-17 LAB — NOVEL CORONAVIRUS, NAA (HOSP ORDER, SEND-OUT TO REF LAB; TAT 18-24 HRS): SARS-CoV-2, NAA: NOT DETECTED

## 2019-01-19 ENCOUNTER — Other Ambulatory Visit: Payer: Self-pay | Admitting: Internal Medicine

## 2019-01-19 DIAGNOSIS — R400 Somnolence: Secondary | ICD-10-CM | POA: Diagnosis not present

## 2019-01-19 DIAGNOSIS — R634 Abnormal weight loss: Secondary | ICD-10-CM | POA: Diagnosis not present

## 2019-01-19 DIAGNOSIS — R1310 Dysphagia, unspecified: Secondary | ICD-10-CM

## 2019-01-19 DIAGNOSIS — E1151 Type 2 diabetes mellitus with diabetic peripheral angiopathy without gangrene: Secondary | ICD-10-CM | POA: Diagnosis not present

## 2019-01-19 DIAGNOSIS — R410 Disorientation, unspecified: Secondary | ICD-10-CM | POA: Diagnosis not present

## 2019-01-19 DIAGNOSIS — R05 Cough: Secondary | ICD-10-CM | POA: Diagnosis not present

## 2019-01-23 DIAGNOSIS — R634 Abnormal weight loss: Secondary | ICD-10-CM | POA: Diagnosis not present

## 2019-01-23 DIAGNOSIS — Z20818 Contact with and (suspected) exposure to other bacterial communicable diseases: Secondary | ICD-10-CM | POA: Diagnosis not present

## 2019-01-23 DIAGNOSIS — R1310 Dysphagia, unspecified: Secondary | ICD-10-CM | POA: Diagnosis not present

## 2019-01-23 DIAGNOSIS — R410 Disorientation, unspecified: Secondary | ICD-10-CM | POA: Diagnosis not present

## 2019-01-23 DIAGNOSIS — R5381 Other malaise: Secondary | ICD-10-CM | POA: Diagnosis not present

## 2019-01-23 DIAGNOSIS — I10 Essential (primary) hypertension: Secondary | ICD-10-CM | POA: Diagnosis not present

## 2019-01-23 DIAGNOSIS — E1151 Type 2 diabetes mellitus with diabetic peripheral angiopathy without gangrene: Secondary | ICD-10-CM | POA: Diagnosis not present

## 2019-01-28 ENCOUNTER — Emergency Department (HOSPITAL_COMMUNITY): Payer: Medicare HMO

## 2019-01-28 ENCOUNTER — Inpatient Hospital Stay (HOSPITAL_COMMUNITY): Payer: Medicare HMO

## 2019-01-28 ENCOUNTER — Encounter (HOSPITAL_COMMUNITY)
Admission: EM | Disposition: A | Discharge status: Hospice | Payer: Self-pay | Source: Home / Self Care | Attending: Neurology

## 2019-01-28 ENCOUNTER — Inpatient Hospital Stay (HOSPITAL_COMMUNITY): Payer: Medicare HMO | Admitting: Certified Registered"

## 2019-01-28 ENCOUNTER — Encounter (HOSPITAL_COMMUNITY): Payer: Self-pay | Admitting: Emergency Medicine

## 2019-01-28 ENCOUNTER — Inpatient Hospital Stay (HOSPITAL_COMMUNITY)
Admission: EM | Admit: 2019-01-28 | Discharge: 2019-02-11 | DRG: 023 | Disposition: A | Discharge status: Hospice | Payer: Medicare HMO | Attending: Neurology | Admitting: Neurology

## 2019-01-28 ENCOUNTER — Other Ambulatory Visit: Payer: Self-pay

## 2019-01-28 DIAGNOSIS — I6529 Occlusion and stenosis of unspecified carotid artery: Secondary | ICD-10-CM | POA: Diagnosis not present

## 2019-01-28 DIAGNOSIS — G92 Toxic encephalopathy: Secondary | ICD-10-CM | POA: Diagnosis present

## 2019-01-28 DIAGNOSIS — I513 Intracardiac thrombosis, not elsewhere classified: Secondary | ICD-10-CM | POA: Diagnosis not present

## 2019-01-28 DIAGNOSIS — I6521 Occlusion and stenosis of right carotid artery: Secondary | ICD-10-CM | POA: Diagnosis present

## 2019-01-28 DIAGNOSIS — R55 Syncope and collapse: Secondary | ICD-10-CM | POA: Diagnosis present

## 2019-01-28 DIAGNOSIS — M2550 Pain in unspecified joint: Secondary | ICD-10-CM | POA: Diagnosis not present

## 2019-01-28 DIAGNOSIS — G8194 Hemiplegia, unspecified affecting left nondominant side: Secondary | ICD-10-CM | POA: Diagnosis present

## 2019-01-28 DIAGNOSIS — R0602 Shortness of breath: Secondary | ICD-10-CM | POA: Diagnosis not present

## 2019-01-28 DIAGNOSIS — I480 Paroxysmal atrial fibrillation: Secondary | ICD-10-CM | POA: Diagnosis not present

## 2019-01-28 DIAGNOSIS — I10 Essential (primary) hypertension: Secondary | ICD-10-CM | POA: Diagnosis not present

## 2019-01-28 DIAGNOSIS — I213 ST elevation (STEMI) myocardial infarction of unspecified site: Secondary | ICD-10-CM | POA: Diagnosis not present

## 2019-01-28 DIAGNOSIS — R29810 Facial weakness: Secondary | ICD-10-CM | POA: Diagnosis present

## 2019-01-28 DIAGNOSIS — R778 Other specified abnormalities of plasma proteins: Secondary | ICD-10-CM | POA: Diagnosis present

## 2019-01-28 DIAGNOSIS — I5020 Unspecified systolic (congestive) heart failure: Secondary | ICD-10-CM | POA: Diagnosis not present

## 2019-01-28 DIAGNOSIS — Z4659 Encounter for fitting and adjustment of other gastrointestinal appliance and device: Secondary | ICD-10-CM | POA: Diagnosis not present

## 2019-01-28 DIAGNOSIS — E871 Hypo-osmolality and hyponatremia: Secondary | ICD-10-CM | POA: Diagnosis present

## 2019-01-28 DIAGNOSIS — I5041 Acute combined systolic (congestive) and diastolic (congestive) heart failure: Secondary | ICD-10-CM | POA: Diagnosis not present

## 2019-01-28 DIAGNOSIS — Z781 Physical restraint status: Secondary | ICD-10-CM

## 2019-01-28 DIAGNOSIS — E1165 Type 2 diabetes mellitus with hyperglycemia: Secondary | ICD-10-CM | POA: Diagnosis not present

## 2019-01-28 DIAGNOSIS — Z9114 Patient's other noncompliance with medication regimen: Secondary | ICD-10-CM

## 2019-01-28 DIAGNOSIS — I63231 Cerebral infarction due to unspecified occlusion or stenosis of right carotid arteries: Secondary | ICD-10-CM | POA: Diagnosis not present

## 2019-01-28 DIAGNOSIS — E876 Hypokalemia: Secondary | ICD-10-CM | POA: Diagnosis not present

## 2019-01-28 DIAGNOSIS — I959 Hypotension, unspecified: Secondary | ICD-10-CM | POA: Diagnosis not present

## 2019-01-28 DIAGNOSIS — R1310 Dysphagia, unspecified: Secondary | ICD-10-CM | POA: Diagnosis present

## 2019-01-28 DIAGNOSIS — I110 Hypertensive heart disease with heart failure: Secondary | ICD-10-CM | POA: Diagnosis not present

## 2019-01-28 DIAGNOSIS — Z66 Do not resuscitate: Secondary | ICD-10-CM | POA: Diagnosis not present

## 2019-01-28 DIAGNOSIS — Z9889 Other specified postprocedural states: Secondary | ICD-10-CM | POA: Diagnosis not present

## 2019-01-28 DIAGNOSIS — I5021 Acute systolic (congestive) heart failure: Secondary | ICD-10-CM | POA: Diagnosis present

## 2019-01-28 DIAGNOSIS — R29722 NIHSS score 22: Secondary | ICD-10-CM | POA: Diagnosis present

## 2019-01-28 DIAGNOSIS — R414 Neurologic neglect syndrome: Secondary | ICD-10-CM | POA: Diagnosis present

## 2019-01-28 DIAGNOSIS — I9589 Other hypotension: Secondary | ICD-10-CM | POA: Diagnosis not present

## 2019-01-28 DIAGNOSIS — I255 Ischemic cardiomyopathy: Secondary | ICD-10-CM | POA: Diagnosis present

## 2019-01-28 DIAGNOSIS — E7800 Pure hypercholesterolemia, unspecified: Secondary | ICD-10-CM | POA: Diagnosis present

## 2019-01-28 DIAGNOSIS — Z4682 Encounter for fitting and adjustment of non-vascular catheter: Secondary | ICD-10-CM | POA: Diagnosis not present

## 2019-01-28 DIAGNOSIS — G8190 Hemiplegia, unspecified affecting unspecified side: Secondary | ICD-10-CM | POA: Diagnosis not present

## 2019-01-28 DIAGNOSIS — D72829 Elevated white blood cell count, unspecified: Secondary | ICD-10-CM | POA: Diagnosis present

## 2019-01-28 DIAGNOSIS — I2510 Atherosclerotic heart disease of native coronary artery without angina pectoris: Secondary | ICD-10-CM | POA: Diagnosis present

## 2019-01-28 DIAGNOSIS — G253 Myoclonus: Secondary | ICD-10-CM | POA: Diagnosis present

## 2019-01-28 DIAGNOSIS — I63411 Cerebral infarction due to embolism of right middle cerebral artery: Secondary | ICD-10-CM | POA: Diagnosis not present

## 2019-01-28 DIAGNOSIS — Z515 Encounter for palliative care: Secondary | ICD-10-CM

## 2019-01-28 DIAGNOSIS — D62 Acute posthemorrhagic anemia: Secondary | ICD-10-CM | POA: Diagnosis present

## 2019-01-28 DIAGNOSIS — R442 Other hallucinations: Secondary | ICD-10-CM | POA: Diagnosis not present

## 2019-01-28 DIAGNOSIS — I361 Nonrheumatic tricuspid (valve) insufficiency: Secondary | ICD-10-CM | POA: Diagnosis not present

## 2019-01-28 DIAGNOSIS — E1122 Type 2 diabetes mellitus with diabetic chronic kidney disease: Secondary | ICD-10-CM | POA: Diagnosis present

## 2019-01-28 DIAGNOSIS — Z20828 Contact with and (suspected) exposure to other viral communicable diseases: Secondary | ICD-10-CM | POA: Diagnosis present

## 2019-01-28 DIAGNOSIS — E785 Hyperlipidemia, unspecified: Secondary | ICD-10-CM | POA: Diagnosis present

## 2019-01-28 DIAGNOSIS — R9431 Abnormal electrocardiogram [ECG] [EKG]: Secondary | ICD-10-CM

## 2019-01-28 DIAGNOSIS — I509 Heart failure, unspecified: Secondary | ICD-10-CM | POA: Diagnosis not present

## 2019-01-28 DIAGNOSIS — I4891 Unspecified atrial fibrillation: Secondary | ICD-10-CM | POA: Diagnosis present

## 2019-01-28 DIAGNOSIS — E46 Unspecified protein-calorie malnutrition: Secondary | ICD-10-CM | POA: Diagnosis present

## 2019-01-28 DIAGNOSIS — I63131 Cerebral infarction due to embolism of right carotid artery: Secondary | ICD-10-CM | POA: Diagnosis not present

## 2019-01-28 DIAGNOSIS — S199XXA Unspecified injury of neck, initial encounter: Secondary | ICD-10-CM | POA: Diagnosis not present

## 2019-01-28 DIAGNOSIS — J811 Chronic pulmonary edema: Secondary | ICD-10-CM | POA: Diagnosis not present

## 2019-01-28 DIAGNOSIS — R570 Cardiogenic shock: Secondary | ICD-10-CM | POA: Diagnosis not present

## 2019-01-28 DIAGNOSIS — R7989 Other specified abnormal findings of blood chemistry: Secondary | ICD-10-CM | POA: Diagnosis present

## 2019-01-28 DIAGNOSIS — E861 Hypovolemia: Secondary | ICD-10-CM | POA: Diagnosis not present

## 2019-01-28 DIAGNOSIS — J9601 Acute respiratory failure with hypoxia: Secondary | ICD-10-CM | POA: Diagnosis not present

## 2019-01-28 DIAGNOSIS — I6340 Cerebral infarction due to embolism of unspecified cerebral artery: Secondary | ICD-10-CM | POA: Diagnosis not present

## 2019-01-28 DIAGNOSIS — I2109 ST elevation (STEMI) myocardial infarction involving other coronary artery of anterior wall: Secondary | ICD-10-CM | POA: Diagnosis not present

## 2019-01-28 DIAGNOSIS — I69354 Hemiplegia and hemiparesis following cerebral infarction affecting left non-dominant side: Secondary | ICD-10-CM | POA: Diagnosis not present

## 2019-01-28 DIAGNOSIS — Z7401 Bed confinement status: Secondary | ICD-10-CM | POA: Diagnosis not present

## 2019-01-28 DIAGNOSIS — J90 Pleural effusion, not elsewhere classified: Secondary | ICD-10-CM | POA: Diagnosis not present

## 2019-01-28 DIAGNOSIS — E872 Acidosis: Secondary | ICD-10-CM | POA: Diagnosis present

## 2019-01-28 DIAGNOSIS — N179 Acute kidney failure, unspecified: Secondary | ICD-10-CM | POA: Diagnosis present

## 2019-01-28 DIAGNOSIS — K922 Gastrointestinal hemorrhage, unspecified: Secondary | ICD-10-CM | POA: Diagnosis not present

## 2019-01-28 DIAGNOSIS — R4182 Altered mental status, unspecified: Secondary | ICD-10-CM | POA: Diagnosis not present

## 2019-01-28 DIAGNOSIS — Z7982 Long term (current) use of aspirin: Secondary | ICD-10-CM

## 2019-01-28 DIAGNOSIS — I130 Hypertensive heart and chronic kidney disease with heart failure and stage 1 through stage 4 chronic kidney disease, or unspecified chronic kidney disease: Secondary | ICD-10-CM | POA: Diagnosis present

## 2019-01-28 DIAGNOSIS — I5023 Acute on chronic systolic (congestive) heart failure: Secondary | ICD-10-CM | POA: Diagnosis not present

## 2019-01-28 DIAGNOSIS — R918 Other nonspecific abnormal finding of lung field: Secondary | ICD-10-CM | POA: Diagnosis not present

## 2019-01-28 DIAGNOSIS — I63233 Cerebral infarction due to unspecified occlusion or stenosis of bilateral carotid arteries: Principal | ICD-10-CM | POA: Diagnosis present

## 2019-01-28 DIAGNOSIS — Z7984 Long term (current) use of oral hypoglycemic drugs: Secondary | ICD-10-CM

## 2019-01-28 DIAGNOSIS — I639 Cerebral infarction, unspecified: Secondary | ICD-10-CM | POA: Diagnosis not present

## 2019-01-28 DIAGNOSIS — I69318 Other symptoms and signs involving cognitive functions following cerebral infarction: Secondary | ICD-10-CM | POA: Diagnosis not present

## 2019-01-28 DIAGNOSIS — I220 Subsequent ST elevation (STEMI) myocardial infarction of anterior wall: Secondary | ICD-10-CM | POA: Diagnosis not present

## 2019-01-28 DIAGNOSIS — I5181 Takotsubo syndrome: Secondary | ICD-10-CM | POA: Diagnosis not present

## 2019-01-28 DIAGNOSIS — T1490XA Injury, unspecified, initial encounter: Secondary | ICD-10-CM

## 2019-01-28 DIAGNOSIS — N1831 Chronic kidney disease, stage 3a: Secondary | ICD-10-CM | POA: Diagnosis present

## 2019-01-28 DIAGNOSIS — I420 Dilated cardiomyopathy: Secondary | ICD-10-CM | POA: Diagnosis not present

## 2019-01-28 DIAGNOSIS — E119 Type 2 diabetes mellitus without complications: Secondary | ICD-10-CM | POA: Diagnosis not present

## 2019-01-28 DIAGNOSIS — Z79899 Other long term (current) drug therapy: Secondary | ICD-10-CM

## 2019-01-28 DIAGNOSIS — R404 Transient alteration of awareness: Secondary | ICD-10-CM | POA: Diagnosis not present

## 2019-01-28 DIAGNOSIS — G459 Transient cerebral ischemic attack, unspecified: Secondary | ICD-10-CM | POA: Diagnosis not present

## 2019-01-28 HISTORY — DX: Intracardiac thrombosis, not elsewhere classified: I51.3

## 2019-01-28 HISTORY — DX: Takotsubo syndrome: I51.81

## 2019-01-28 LAB — COMPREHENSIVE METABOLIC PANEL
ALT: 49 U/L — ABNORMAL HIGH (ref 0–44)
AST: 67 U/L — ABNORMAL HIGH (ref 15–41)
Albumin: 2.3 g/dL — ABNORMAL LOW (ref 3.5–5.0)
Alkaline Phosphatase: 133 U/L — ABNORMAL HIGH (ref 38–126)
Anion gap: 15 (ref 5–15)
BUN: 54 mg/dL — ABNORMAL HIGH (ref 8–23)
CO2: 18 mmol/L — ABNORMAL LOW (ref 22–32)
Calcium: 8.1 mg/dL — ABNORMAL LOW (ref 8.9–10.3)
Chloride: 100 mmol/L (ref 98–111)
Creatinine, Ser: 2.32 mg/dL — ABNORMAL HIGH (ref 0.61–1.24)
GFR calc Af Amer: 28 mL/min — ABNORMAL LOW (ref >60)
GFR calc non Af Amer: 25 mL/min — ABNORMAL LOW (ref >60)
Glucose, Bld: 225 mg/dL — ABNORMAL HIGH (ref 70–99)
Potassium: 4.6 mmol/L (ref 3.5–5.1)
Sodium: 133 mmol/L — ABNORMAL LOW (ref 135–145)
Total Bilirubin: 1.6 mg/dL — ABNORMAL HIGH (ref 0.3–1.2)
Total Protein: 6 g/dL — ABNORMAL LOW (ref 6.5–8.1)

## 2019-01-28 LAB — CBC
HCT: 38.8 % — ABNORMAL LOW (ref 39.0–52.0)
Hemoglobin: 12.9 g/dL — ABNORMAL LOW (ref 13.0–17.0)
MCH: 27.7 pg (ref 26.0–34.0)
MCHC: 33.2 g/dL (ref 30.0–36.0)
MCV: 83.3 fL (ref 80.0–100.0)
Platelets: 359 10*3/uL (ref 150–400)
RBC: 4.66 MIL/uL (ref 4.22–5.81)
RDW: 12.5 % (ref 11.5–15.5)
WBC: 15 10*3/uL — ABNORMAL HIGH (ref 4.0–10.5)
nRBC: 0 % (ref 0.0–0.2)

## 2019-01-28 LAB — I-STAT CHEM 8, ED
BUN: 68 mg/dL — ABNORMAL HIGH (ref 8–23)
Calcium, Ion: 1.02 mmol/L — ABNORMAL LOW (ref 1.15–1.40)
Chloride: 98 mmol/L (ref 98–111)
Creatinine, Ser: 2.2 mg/dL — ABNORMAL HIGH (ref 0.61–1.24)
Glucose, Bld: 224 mg/dL — ABNORMAL HIGH (ref 70–99)
HCT: 40 % (ref 39.0–52.0)
Hemoglobin: 13.6 g/dL (ref 13.0–17.0)
Potassium: 4.6 mmol/L (ref 3.5–5.1)
Sodium: 134 mmol/L — ABNORMAL LOW (ref 135–145)
TCO2: 23 mmol/L (ref 22–32)

## 2019-01-28 LAB — DIFFERENTIAL
Abs Immature Granulocytes: 0.34 10*3/uL — ABNORMAL HIGH (ref 0.00–0.07)
Basophils Absolute: 0 10*3/uL (ref 0.0–0.1)
Basophils Relative: 0 %
Eosinophils Absolute: 0 10*3/uL (ref 0.0–0.5)
Eosinophils Relative: 0 %
Immature Granulocytes: 2 %
Lymphocytes Relative: 6 %
Lymphs Abs: 0.9 10*3/uL (ref 0.7–4.0)
Monocytes Absolute: 1.7 10*3/uL — ABNORMAL HIGH (ref 0.1–1.0)
Monocytes Relative: 11 %
Neutro Abs: 12.1 10*3/uL — ABNORMAL HIGH (ref 1.7–7.7)
Neutrophils Relative %: 81 %

## 2019-01-28 LAB — RESPIRATORY PANEL BY RT PCR (FLU A&B, COVID)
Influenza A by PCR: NEGATIVE
Influenza B by PCR: NEGATIVE
SARS Coronavirus 2 by RT PCR: NEGATIVE

## 2019-01-28 LAB — ETHANOL: Alcohol, Ethyl (B): <10 mg/dL (ref <10)

## 2019-01-28 LAB — TROPONIN I (HIGH SENSITIVITY): Troponin I (High Sensitivity): 15519 ng/L (ref <18)

## 2019-01-28 SURGERY — IR WITH ANESTHESIA
Anesthesia: General

## 2019-01-28 MED ORDER — TICAGRELOR 60 MG PO TABS
ORAL_TABLET | ORAL | Status: AC | PRN
  Administered 2019-01-28: 180 mg via NASOGASTRIC

## 2019-01-28 MED ORDER — TIROFIBAN HCL IN NACL 5-0.9 MG/100ML-% IV SOLN
INTRAVENOUS | Status: AC
  Filled 2019-01-28: qty 100

## 2019-01-28 MED ORDER — SENNOSIDES-DOCUSATE SODIUM 8.6-50 MG PO TABS: 1.0000 | ORAL_TABLET | Freq: Every evening | ORAL | Status: DC | PRN

## 2019-01-28 MED ORDER — FENTANYL CITRATE (PF) 100 MCG/2ML IJ SOLN
INTRAMUSCULAR | Status: AC
  Filled 2019-01-28: qty 2

## 2019-01-28 MED ORDER — CEFAZOLIN SODIUM-DEXTROSE 2-3 GM-%(50ML) IV SOLR
INTRAVENOUS | Status: DC | PRN
  Administered 2019-01-28: 2 g via INTRAVENOUS

## 2019-01-28 MED ORDER — TICAGRELOR 90 MG PO TABS
ORAL_TABLET | ORAL | Status: AC
  Filled 2019-01-28: qty 2

## 2019-01-28 MED ORDER — ASPIRIN 81 MG PO CHEW
CHEWABLE_TABLET | ORAL | Status: AC
  Filled 2019-01-28: qty 1

## 2019-01-28 MED ORDER — ACETAMINOPHEN 650 MG RE SUPP: 650.0000 mg | RECTAL | Status: DC | PRN

## 2019-01-28 MED ORDER — STROKE: EARLY STAGES OF RECOVERY BOOK
Freq: Once | Status: AC
  Filled 2019-01-28: qty 1

## 2019-01-28 MED ORDER — ROCURONIUM 10MG/ML (10ML) SYRINGE FOR MEDFUSION PUMP - OPTIME
INTRAVENOUS | Status: DC | PRN
  Administered 2019-01-28 (×2): 50 mg via INTRAVENOUS

## 2019-01-28 MED ORDER — CLOPIDOGREL BISULFATE 300 MG PO TABS
ORAL_TABLET | ORAL | Status: AC
  Filled 2019-01-28: qty 1

## 2019-01-28 MED ORDER — SODIUM CHLORIDE 0.9 % IV SOLN: INTRAVENOUS | Status: DC | PRN

## 2019-01-28 MED ORDER — IOHEXOL 350 MG/ML SOLN
100.0000 mL | Freq: Once | INTRAVENOUS | Status: AC | PRN
  Administered 2019-01-28: 100 mL via INTRAVENOUS

## 2019-01-28 MED ORDER — VASOPRESSIN 20 UNIT/ML IV SOLN
INTRAVENOUS | Status: DC | PRN
  Administered 2019-01-28 – 2019-01-29 (×2): 2 m[IU] via INTRAVENOUS

## 2019-01-28 MED ORDER — ASPIRIN 325 MG PO TABS
ORAL_TABLET | ORAL | Status: AC
  Filled 2019-01-28: qty 1

## 2019-01-28 MED ORDER — SODIUM CHLORIDE 0.9 % IV SOLN: INTRAVENOUS | Status: DC

## 2019-01-28 MED ORDER — PHENYLEPHRINE HCL-NACL 10-0.9 MG/250ML-% IV SOLN
INTRAVENOUS | Status: DC | PRN
  Administered 2019-01-28: 50 ug/min via INTRAVENOUS

## 2019-01-28 MED ORDER — VERAPAMIL HCL 2.5 MG/ML IV SOLN
INTRAVENOUS | Status: AC
  Filled 2019-01-28: qty 2

## 2019-01-28 MED ORDER — PROPOFOL 10 MG/ML IV BOLUS
INTRAVENOUS | Status: DC | PRN
  Administered 2019-01-28: 130 ug via INTRAVENOUS

## 2019-01-28 MED ORDER — FENTANYL CITRATE (PF) 100 MCG/2ML IJ SOLN
INTRAMUSCULAR | Status: DC | PRN
  Administered 2019-01-28: 100 ug via INTRAVENOUS

## 2019-01-28 MED ORDER — LIDOCAINE HCL (CARDIAC) PF 100 MG/5ML IV SOSY
PREFILLED_SYRINGE | INTRAVENOUS | Status: DC | PRN
  Administered 2019-01-28: 100 mg via INTRATRACHEAL

## 2019-01-28 MED ORDER — EPTIFIBATIDE 20 MG/10ML IV SOLN
INTRAVENOUS | Status: AC
  Filled 2019-01-28: qty 10

## 2019-01-28 MED ORDER — NITROGLYCERIN 1 MG/10 ML FOR IR/CATH LAB
INTRA_ARTERIAL | Status: AC
  Filled 2019-01-28: qty 10

## 2019-01-28 MED ORDER — SUCCINYLCHOLINE CHLORIDE 20 MG/ML IJ SOLN
INTRAMUSCULAR | Status: DC | PRN
  Administered 2019-01-28: 140 mg via INTRAVENOUS

## 2019-01-28 MED ORDER — ACETAMINOPHEN 160 MG/5ML PO SOLN: 650.0000 mg | ORAL | Status: DC | PRN

## 2019-01-28 MED ORDER — ACETAMINOPHEN 325 MG PO TABS: 650.0000 mg | ORAL_TABLET | ORAL | Status: DC | PRN

## 2019-01-28 MED ORDER — VERAPAMIL HCL 2.5 MG/ML IV SOLN
INTRAVENOUS | Status: DC | PRN
  Administered 2019-01-28 – 2019-01-29 (×2): 2.5 mg via INTRAVENOUS

## 2019-01-28 MED ORDER — EPTIFIBATIDE 20 MG/10ML IV SOLN
INTRAVENOUS | Status: AC | PRN
  Administered 2019-01-28 – 2019-01-29 (×5): 1.5 mg via INTRAVENOUS

## 2019-01-28 MED ORDER — IOHEXOL 300 MG/ML  SOLN: 100.0000 mL | Freq: Once | INTRAMUSCULAR | Status: DC | PRN

## 2019-01-28 MED ORDER — ASPIRIN EC 81 MG PO TBEC
DELAYED_RELEASE_TABLET | ORAL | Status: AC | PRN
  Administered 2019-01-28: 81 mg via ORAL

## 2019-01-28 MED ORDER — PHENYLEPHRINE HCL (PRESSORS) 10 MG/ML IV SOLN
INTRAVENOUS | Status: DC | PRN
  Administered 2019-01-28: 120 ug via INTRAVENOUS
  Administered 2019-01-28: 80 ug via INTRAVENOUS

## 2019-01-28 MED ORDER — CEFAZOLIN SODIUM-DEXTROSE 2-4 GM/100ML-% IV SOLN
INTRAVENOUS | Status: AC
  Filled 2019-01-28: qty 100

## 2019-01-28 NOTE — Progress Notes (Signed)
This is an 85yo male with a hx of HLD, HTN, DM who we are Asked to see patient due to elevated troponin.    Apparently the patient was seen in the  ER 01/15/2019 with complaints of fatigue and poor eating for 3 days prior as well as sleeping a lot. The patient had no complaints at the time.  His workup was negative and he went home.  Today he presented to the ER with  altered mental status per the family report since Monday.  Has been noncompliant with meds.  He has not been eating well and has been sleeping a lot with fatigue. Apparently had a fall today at 1700 and upon EMS arrival had right sided gaze and garbled speech.  He was found to be plegic on the left with facial droop on the left and code stroke was activated.    According to  baseline, up till 2 weeks ago, he was walking without a walker, did bathe and feed himself and only had mild memory problems. Two weeks ago he had a ER evaluation for fatigue and decreased appetite, but most of the work up remained unremarkable and he was d/c'd home from ER. He did have low grade fever and leukopenia at the time. COVID test at the time was negative. No recent COVID exposures reported.  EKG was concerning for Anterior MI with ST elevated in V3-V6 and Dr. Tamala Julian with interventional Cards was called and felt that EKG changes related to acute CVA with occluded carotid artery and no need for intervention.  Neuro called me to notify that hstrop was elevated to 15,519.  Still likely related to the acute CVA with occluded carotid artery with demand ischemia in the setting of AKI.  He has been taken to the OR for emergent carotid surgery.  At this time patient unavailable for evaluation.  Once out of the OR will get a 2D echo to assess LVF.

## 2019-01-28 NOTE — Anesthesia Preprocedure Evaluation (Signed)
Anesthesia Evaluation  Patient identified by MRN, date of birth, ID band Patient unresponsive    Reviewed: Allergy & Precautions, NPO status , Patient's Chart, lab work & pertinent test results, Unable to perform ROS - Chart review only  Airway Mallampati: I  TM Distance: >3 FB Neck ROM: Full    Dental   Pulmonary    Pulmonary exam normal        Cardiovascular Normal cardiovascular exam     Neuro/Psych CVA    GI/Hepatic   Endo/Other    Renal/GU      Musculoskeletal   Abdominal   Peds  Hematology   Anesthesia Other Findings   Reproductive/Obstetrics                             Anesthesia Physical Anesthesia Plan  ASA: IV and emergent  Anesthesia Plan: General   Post-op Pain Management:    Induction: Intravenous, Rapid sequence and Cricoid pressure planned  PONV Risk Score and Plan: 2 and Ondansetron and Treatment may vary due to age or medical condition  Airway Management Planned: Oral ETT  Additional Equipment: Arterial line  Intra-op Plan:   Post-operative Plan: Post-operative intubation/ventilation  Informed Consent: I have reviewed the patients History and Physical, chart, labs and discussed the procedure including the risks, benefits and alternatives for the proposed anesthesia with the patient or authorized representative who has indicated his/her understanding and acceptance.       Plan Discussed with: CRNA and Surgeon  Anesthesia Plan Comments:         Anesthesia Quick Evaluation

## 2019-01-28 NOTE — ED Provider Notes (Signed)
[facility] EMERGENCY DEPARTMENT Provider Note   CSN: [id number] Arrival date & time: 01/28/19  2103     History Chief Complaint  Patient presents with  . Altered Mental Status    Adam Leon is a 85 y.o. male.  He is brought in by EMS, level 5 caveat for altered mental status.  Is a family states he has been declining over the course of a few weeks refusing to take his medicines and poor p.o. intake and poor activity.  He was in the ED about 2 weeks ago for same.  He was ultimately discharged.  Family states last known well was on Monday and since then he has had a fall and had to be carried back to bed.  They are noticing now some difficulty with speech.  Patient himself is able to state his name and that is about all we can get from him.  The history is provided by the EMS personnel. The history is limited by the condition of the patient.  Cerebrovascular Accident This is a new problem. The current episode started 2 days ago. The problem occurs constantly. The problem has not changed since onset.      Past Medical History:  Diagnosis Date  . Hypercholesteremia   . Syncope   . Syncope   . Ventricular hypertrophy     There are no problems to display for this patient.   History reviewed. No pertinent surgical history.     No family history on file.  Social History   Tobacco Use  . Smoking status: Never Smoker  . Smokeless tobacco: Never Used  Substance Use Topics  . Alcohol use: Not on file  . Drug use: Not on file    Home Medications Prior to Admission medications   Medication Sig Start Date End Date Taking? Authorizing Provider  aspirin 81 MG tablet Take 81 mg by mouth daily.    [provider]  atenolol (TENORMIN) 25 MG tablet Take 25 mg by mouth daily. 09/17/17   [provider]  lisinopril (PRINIVIL,ZESTRIL) 5 MG tablet Take 5 mg by mouth daily. 09/17/17   [provider]  metFORMIN (GLUCOPHAGE-XR) 500 MG 24 hr  tablet Take 500 mg by mouth 2 (two) times daily. 09/17/17   [provider]  pravastatin (PRAVACHOL) 40 MG tablet Take 40 mg by mouth daily. 09/17/17   [provider]    Allergies    Patient has no known allergies.  Review of Systems   Review of Systems  Unable to perform ROS: Mental status change    Physical Exam Updated Vital Signs BP (!) 99/58 (BP Location: Right Arm)   Pulse 75   Temp (!) 96.7 F (35.9 C) (Temporal)   Resp (!) 21   SpO2 99%   Physical Exam Vitals and nursing note reviewed.  Constitutional:      Appearance: He is well-developed.  HENT:     Head: Normocephalic and atraumatic.  Eyes:     Conjunctiva/sclera: Conjunctivae normal.  Cardiovascular:     Rate and Rhythm: Normal rate and regular rhythm.     Heart sounds: No murmur.  Pulmonary:     Effort: Pulmonary effort is normal. No respiratory distress.     Breath sounds: Normal breath sounds.  Abdominal:     Palpations: Abdomen is soft.     Tenderness: There is no abdominal tenderness. There is no guarding or rebound.  Musculoskeletal:        General: No deformity. Normal  range of motion.     Cervical back: Neck supple.  Skin:    General: Skin is warm and dry.     Capillary Refill: Capillary refill takes less than 2 seconds.  Neurological:     Mental Status: He is alert.     Comments: Patient is awake and will follow some commands with his right extremity.  He has some motor command of his right upper and right lower extremity.  Obvious left facial droop.  Right gaze preference.  No withdraw to stimuli on left arm or left leg.  Upgoing toe on the left.     ED Results / Procedures / Treatments   Labs (all labs ordered are listed, but only abnormal results are displayed) Labs Reviewed  CBC - Abnormal; Notable for the following components:      Result Value   WBC 15.0 (*)    Hemoglobin 12.9 (*)    HCT 38.8 (*)    All other components within normal limits  DIFFERENTIAL - Abnormal;  Notable for the following components:   Neutro Abs 12.1 (*)    Monocytes Absolute 1.7 (*)    Abs Immature Granulocytes 0.34 (*)    All other components within normal limits  COMPREHENSIVE METABOLIC PANEL - Abnormal; Notable for the following components:   Sodium 133 (*)    CO2 18 (*)    Glucose, Bld 225 (*)    BUN 54 (*)    Creatinine, Ser 2.32 (*)    Calcium 8.1 (*)    Total Protein 6.0 (*)    Albumin 2.3 (*)    AST 67 (*)    ALT 49 (*)    Alkaline Phosphatase 133 (*)    Total Bilirubin 1.6 (*)    GFR calc non Af Amer 25 (*)    GFR calc Af Amer 28 (*)    All other components within normal limits  HEMOGLOBIN A1C - Abnormal; Notable for the following components:   Hgb A1c MFr Bld 8.8 (*)    All other components within normal limits  LIPID PANEL - Abnormal; Notable for the following components:   Triglycerides 244 (*)    HDL <10 (*)    VLDL 49 (*)    All other components within normal limits  CBC WITH DIFFERENTIAL/PLATELET - Abnormal; Notable for the following components:   WBC 17.4 (*)    Hemoglobin 11.8 (*)    HCT 35.8 (*)    Platelets 406 (*)    Neutro Abs 14.5 (*)    Monocytes Absolute 1.7 (*)    Abs Immature Granulocytes 0.41 (*)    All other components within normal limits  BASIC METABOLIC PANEL - Abnormal; Notable for the following components:   Sodium 130 (*)    CO2 12 (*)    Glucose, Bld 285 (*)    BUN 47 (*)    Creatinine, Ser 1.94 (*)    Calcium 7.4 (*)    GFR calc non Af Amer 30 (*)    GFR calc Af Amer 35 (*)    All other components within normal limits  HEPATIC FUNCTION PANEL - Abnormal; Notable for the following components:   Total Protein 5.4 (*)    Albumin 2.0 (*)    AST 68 (*)    ALT 50 (*)    Alkaline Phosphatase 146 (*)    Total Bilirubin 1.6 (*)    Bilirubin, Direct 0.6 (*)    Indirect Bilirubin 1.0 (*)    All other components within  normal limits  GLUCOSE, CAPILLARY - Abnormal; Notable for the following components:   Glucose-Capillary 241  (*)    All other components within normal limits  GLUCOSE, CAPILLARY - Abnormal; Notable for the following components:   Glucose-Capillary 262 (*)    All other components within normal limits  I-STAT CHEM 8, ED - Abnormal; Notable for the following components:   Sodium 134 (*)    BUN 68 (*)    Creatinine, Ser 2.20 (*)    Glucose, Bld 224 (*)    Calcium, Ion 1.02 (*)    All other components within normal limits  TROPONIN I (HIGH SENSITIVITY) - Abnormal; Notable for the following components:   Troponin I (High Sensitivity) 15,519 (*)    All other components within normal limits  TROPONIN I (HIGH SENSITIVITY) - Abnormal; Notable for the following components:   Troponin I (High Sensitivity) 15,715 (*)    All other components within normal limits  RESPIRATORY PANEL BY RT PCR (FLU A&B, COVID)  MRSA PCR SCREENING  ETHANOL  RAPID URINE DRUG SCREEN, HOSP PERFORMED  URINALYSIS, ROUTINE W REFLEX MICROSCOPIC  HEPARIN LEVEL (UNFRACTIONATED)  BRAIN NATRIURETIC PEPTIDE  TROPONIN I (HIGH SENSITIVITY)    EKG EKG Interpretation  Date/Time:  Wednesday January 28 2019 21:10:06 EST Ventricular Rate:  62 PR Interval:    QRS Duration: 141 QT Interval:  454 QTC Calculation: 462 R Axis:   -86 Text Interpretation: Sinus rhythm Atrial premature complex Right bundle branch block Anterolateral infarct, acute (LAD) >>> Acute MI <<< new from prior 12/20 Confirmed by Meridee Score [phone]) on 01/28/2019 9:27:57 PM   Radiology CT Code Stroke CTA Head W/WO contrast  Result Date: 01/28/2019 CLINICAL DATA:  Stroke.  Slurred speech.  Left facial droop. EXAM: CT ANGIOGRAPHY HEAD AND NECK CT PERFUSION BRAIN TECHNIQUE: Multidetector CT imaging of the head and neck was performed using the standard protocol during bolus administration of intravenous contrast. Multiplanar CT image reconstructions and MIPs were obtained to evaluate the vascular anatomy. Carotid stenosis measurements (when applicable) are obtained  utilizing NASCET criteria, using the distal internal carotid diameter as the denominator. Multiphase CT imaging of the brain was performed following IV bolus contrast injection. Subsequent parametric perfusion maps were calculated using RAPID software. CONTRAST:  OMNIPAQUE IOHEXOL 350 MG/ML SOLN COMPARISON:  CT head 01/28/2019 FINDINGS: CTA NECK FINDINGS Aortic arch: Proximal great vessels widely patent. Incomplete imaging of the aortic arch. Right carotid system: Right common carotid artery widely patent. 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 Right internal carotid artery remains occluded through the cavernous segment. Reconstitution of the supraclinoid internal carotid artery on the right. Left carotid system: Left carotid bifurcation widely patent without significant stenosis. Vertebral arteries: Both vertebral arteries are patent to the basilar without significant stenosis. Skeleton: No acute skeletal abnormality.  Poor dentition. Other neck: Thyroid goiter.  No mass lesion. Upper chest: Mild pleural thickening along the major fissure on the right. Otherwise lungs clear. Review of the MIP images confirms the above findings CTA HEAD FINDINGS Anterior circulation: Right internal carotid artery is occluded through the cavernous segment with reconstitution of the supraclinoid segment. Both anterior and middle cerebral arteries are patent without significant stenosis or thrombus. Right anterior and middle cerebral arteries supplied through the anterior communicating artery. No significant posterior communicating artery identified on the right. Left cavernous carotid widely patent. Posterior circulation: Both vertebral arteries patent to the basilar. PICA patent. Basilar widely patent. AICA, superior cerebellar, posterior cerebral arteries patent bilaterally. Venous sinuses: Minimal venous  contrast due to arterial phase scanning Anatomic variants: None Review of the MIP images confirms the above  findings CT Brain Perfusion Findings: ASPECTS: 10 CBF (<30%) Volume: 13mL Perfusion (Tmax>6.0s) volume: Mismatch Volume: Infarction Location:Right MCA territory shows diffuse delayed perfusion. Core infarct in the right watershed territory in the deep white matter. IMPRESSION: 1. 13 mL core infarct in the watershed territory on the right. Large area of delayed perfusion right MCA territory due to occlusion of the right internal carotid artery. Right anterior middle cerebral arteries are patent and supplied through the anterior communicating artery. Collateral circulation is most likely the reason for delayed perfusion in the right MCA territory. 2. No other significant intracranial stenosis. 3. These results were called by telephone at the time of interpretation on 01/28/2019 at 9:54 pm to provider [facility] , who verbally acknowledged these results. Electronically Signed   By: Marlan Palau M.D.   On: 01/28/2019 21:56   CT Code Stroke CTA Neck W/WO contrast  Result Date: 01/28/2019 CLINICAL DATA:  Stroke.  Slurred speech.  Left facial droop. EXAM: CT ANGIOGRAPHY HEAD AND NECK CT PERFUSION BRAIN TECHNIQUE: Multidetector CT imaging of the head and neck was performed using the standard protocol during bolus administration of intravenous contrast. Multiplanar CT image reconstructions and MIPs were obtained to evaluate the vascular anatomy. Carotid stenosis measurements (when applicable) are obtained utilizing NASCET criteria, using the distal internal carotid diameter as the denominator. Multiphase CT imaging of the brain was performed following IV bolus contrast injection. Subsequent parametric perfusion maps were calculated using RAPID software. CONTRAST:  OMNIPAQUE IOHEXOL 350 MG/ML SOLN COMPARISON:  CT head 01/28/2019 FINDINGS: CTA NECK FINDINGS Aortic arch: Proximal great vessels widely patent. Incomplete imaging of the aortic arch. Right carotid system: Right common carotid artery  widely patent. 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 Right internal carotid artery remains occluded through the cavernous segment. Reconstitution of the supraclinoid internal carotid artery on the right. Left carotid system: Left carotid bifurcation widely patent without significant stenosis. Vertebral arteries: Both vertebral arteries are patent to the basilar without significant stenosis. Skeleton: No acute skeletal abnormality.  Poor dentition. Other neck: Thyroid goiter.  No mass lesion. Upper chest: Mild pleural thickening along the major fissure on the right. Otherwise lungs clear. Review of the MIP images confirms the above findings CTA HEAD FINDINGS Anterior circulation: Right internal carotid artery is occluded through the cavernous segment with reconstitution of the supraclinoid segment. Both anterior and middle cerebral arteries are patent without significant stenosis or thrombus. Right anterior and middle cerebral arteries supplied through the anterior communicating artery. No significant posterior communicating artery identified on the right. Left cavernous carotid widely patent. Posterior circulation: Both vertebral arteries patent to the basilar. PICA patent. Basilar widely patent. AICA, superior cerebellar, posterior cerebral arteries patent bilaterally. Venous sinuses: Minimal venous contrast due to arterial phase scanning Anatomic variants: None Review of the MIP images confirms the above findings CT Brain Perfusion Findings: ASPECTS: 10 CBF (<30%) Volume: 13mL Perfusion (Tmax>6.0s) volume: Mismatch Volume: Infarction Location:Right MCA territory shows diffuse delayed perfusion. Core infarct in the right watershed territory in the deep white matter. IMPRESSION: 1. 13 mL core infarct in the watershed territory on the right. Large area of delayed perfusion right MCA territory due to occlusion of the right internal carotid artery. Right anterior middle cerebral arteries are  patent and supplied through the anterior communicating artery. Collateral circulation is most likely the reason for delayed perfusion in the right  MCA territory. 2. No other significant intracranial stenosis. 3. These results were called by telephone at the time of interpretation on 01/28/2019 at 9:54 pm to provider [facility] , who verbally acknowledged these results. Electronically Signed   By: Marlan Palau M.D.   On: 01/28/2019 21:56   CT C-SPINE NO CHARGE  Result Date: 01/28/2019 CLINICAL DATA:  Trauma.  Rule out spine fracture EXAM: CT CERVICAL SPINE WITHOUT CONTRAST TECHNIQUE: Multidetector CT imaging of the cervical spine was performed without intravenous contrast. Multiplanar CT image reconstructions were also generated. COMPARISON:  None. FINDINGS: Alignment: Normal Skull base and vertebrae: Negative for fracture Soft tissues and spinal canal: Goiter with enlargement of the right lobe of the thyroid. No adenopathy in the neck. Disc levels: Mild disc degeneration and spurring C5-6 with foraminal narrowing bilaterally. Upper chest: Mild thickening of the major fissure on the right. Otherwise lung apices clear. Other: None IMPRESSION: Negative for cervical spine fracture. Electronically Signed   By: Marlan Palau M.D.   On: 01/28/2019 21:59   CT Code Stroke Cerebral Perfusion with contrast  Result Date: 01/28/2019 CLINICAL DATA:  Stroke.  Slurred speech.  Left facial droop. EXAM: CT ANGIOGRAPHY HEAD AND NECK CT PERFUSION BRAIN TECHNIQUE: Multidetector CT imaging of the head and neck was performed using the standard protocol during bolus administration of intravenous contrast. Multiplanar CT image reconstructions and MIPs were obtained to evaluate the vascular anatomy. Carotid stenosis measurements (when applicable) are obtained utilizing NASCET criteria, using the distal internal carotid diameter as the denominator. Multiphase CT imaging of the brain was performed following IV bolus contrast  injection. Subsequent parametric perfusion maps were calculated using RAPID software. CONTRAST:  OMNIPAQUE IOHEXOL 350 MG/ML SOLN COMPARISON:  CT head 01/28/2019 FINDINGS: CTA NECK FINDINGS Aortic arch: Proximal great vessels widely patent. Incomplete imaging of the aortic arch. Right carotid system: Right common carotid artery widely patent. 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 Right internal carotid artery remains occluded through the cavernous segment. Reconstitution of the supraclinoid internal carotid artery on the right. Left carotid system: Left carotid bifurcation widely patent without significant stenosis. Vertebral arteries: Both vertebral arteries are patent to the basilar without significant stenosis. Skeleton: No acute skeletal abnormality.  Poor dentition. Other neck: Thyroid goiter.  No mass lesion. Upper chest: Mild pleural thickening along the major fissure on the right. Otherwise lungs clear. Review of the MIP images confirms the above findings CTA HEAD FINDINGS Anterior circulation: Right internal carotid artery is occluded through the cavernous segment with reconstitution of the supraclinoid segment. Both anterior and middle cerebral arteries are patent without significant stenosis or thrombus. Right anterior and middle cerebral arteries supplied through the anterior communicating artery. No significant posterior communicating artery identified on the right. Left cavernous carotid widely patent. Posterior circulation: Both vertebral arteries patent to the basilar. PICA patent. Basilar widely patent. AICA, superior cerebellar, posterior cerebral arteries patent bilaterally. Venous sinuses: Minimal venous contrast due to arterial phase scanning Anatomic variants: None Review of the MIP images confirms the above findings CT Brain Perfusion Findings: ASPECTS: 10 CBF (<30%) Volume: 13mL Perfusion (Tmax>6.0s) volume: Mismatch Volume: Infarction Location:Right MCA  territory shows diffuse delayed perfusion. Core infarct in the right watershed territory in the deep white matter. IMPRESSION: 1. 13 mL core infarct in the watershed territory on the right. Large area of delayed perfusion right MCA territory due to occlusion of the right internal carotid artery. Right anterior middle cerebral arteries are patent and supplied through the anterior communicating  artery. Collateral circulation is most likely the reason for delayed perfusion in the right MCA territory. 2. No other significant intracranial stenosis. 3. These results were called by telephone at the time of interpretation on 01/28/2019 at 9:54 pm to provider [facility] , who verbally acknowledged these results. Electronically Signed   By: Marlan Palau M.D.   On: 01/28/2019 21:56   DG CHEST PORT 1 VIEW  Result Date: 01/29/2019 CLINICAL DATA:  Stroke due to embolism EXAM: PORTABLE CHEST 1 VIEW COMPARISON:  01/15/2019 FINDINGS: Interstitial coarsening with Charyl Dancer lines. No visible effusion or pneumothorax. Normal heart size and stable mediastinal contours. Enteric tube tip and side-port reaches the stomach. IMPRESSION: Interstitial pulmonary edema. Electronically Signed   By: Marnee Spring M.D.   On: 01/29/2019 05:08   DG Abd Portable 1V  Result Date: 01/29/2019 CLINICAL DATA:  OG tube placement. EXAM: PORTABLE ABDOMEN - 1 VIEW COMPARISON:  None. FINDINGS: Tip of the enteric tube is below the diaphragm in the stomach, the side port is in the region of the gastroesophageal junction. Recommend advancement of least 3 cm for optimal placement. Excreted IV contrast in both renal collecting systems and the urinary bladder from prior IV contrast. Lobular contour of the bladder suggests bladder diverticula or chronic bladder outlet obstruction no bowel obstruction. Surgical clips in the right upper quadrant likely from cholecystectomy. IMPRESSION: Tip of the enteric tube below the diaphragm in the stomach, the  side-port in the region of the gastroesophageal junction. Recommend advancement of least 3 cm for optimal placement. Electronically Signed   By: Narda Rutherford M.D.   On: 01/29/2019 03:27   ECHOCARDIOGRAM COMPLETE  Result Date: 01/29/2019   ECHOCARDIOGRAM REPORT   Patient Name:   MICHAELA SHANKEL Date of Exam: 01/29/2019 Medical Rec #:  [id number]       Height:       72.0 in Accession #:    [id number]      Weight:       195.0 lb Date of Birth:  1930/11/07       BSA:          2.11 m Patient Age:    88 years        BP:           120/63 mmHg Patient Gender: M               HR:           71 bpm. Exam Location:  Inpatient Procedure: 2D Echo        STAT ECHO Reported to: Dr. Mayford Knife. Indications:     stroke  History:         Patient has no prior history of Echocardiogram examinations.                  Signs/Symptoms:elevated troponin.  Sonographer:     Delcie Roch Referring Phys:  [phone] TRACI R TURNER Diagnosing Phys: Armanda Magic MD IMPRESSIONS  1. Left ventricular ejection fraction, by visual estimation, is <20%. The left ventricle has severely decreased function. Left ventricular septal wall thickness was mildly increased. Mildly increased left ventricular posterior wall thickness.  2. There is apical ballooning with akinesis of the entire apical wall. There is akinesis of the mid infero and anteroseptum. There is akinesis of the mid and apical inferior, anterior and lateral walls and akineseis of the apical inferolateral wall.  3. Moderate, fixed thrombus on the apical wall of the left ventricle.  4. Definity contrast agent was  given IV to delineate the left ventricular endocardial borders.  5. Global right ventricle has normal systolic function.The right ventricular size is normal. No increase in right ventricular wall thickness.  6. Left atrial size was normal.  7. Right atrial size was normal.  8. The mitral valve is normal in structure. Trivial mitral valve regurgitation. No evidence of mitral stenosis.   9. The tricuspid valve is normal in structure. Tricuspid valve regurgitation is mild. 10. The aortic valve is tricuspid. Aortic valve regurgitation is not visualized. No evidence of aortic valve sclerosis or stenosis. 11. The pulmonic valve was normal in structure. Pulmonic valve regurgitation is not visualized. 12. Normal pulmonary artery systolic pressure. 13. The inferior vena cava is normal in size with greater than 50% respiratory variability, suggesting right atrial pressure of 3 mmHg. 14. Left ventricular diastolic parameters are consistent with Grade I diastolic dysfunction (impaired relaxation). FINDINGS  Left Ventricle: Left ventricular ejection fraction, by visual estimation, is <20%. The left ventricle has severely decreased function. Definity contrast agent was given IV to delineate the left ventricular endocardial borders. Mildly increased left ventricular posterior wall thickness. Left ventricular diastolic parameters are consistent with Grade I diastolic dysfunction (impaired relaxation). There is a moderate, fixed, apical left ventricular thrombus. The thrombus appears flat (mural) in shape.  There is apical ballooning with akinesis of the entire apical wall. There is akinesis of the mid infero and anteroseptum. There is akinesis of the mid and apical inferior, anterior and lateral walls and akineseis of the apical inferolateral wall. Right Ventricle: The right ventricular size is normal. No increase in right ventricular wall thickness. Global RV systolic function is has normal systolic function. The tricuspid regurgitant velocity is 2.47 m/s, and with an assumed right atrial pressure  of 3 mmHg, the estimated right ventricular systolic pressure is normal at 27.4 mmHg. Left Atrium: Left atrial size was normal in size. Right Atrium: Right atrial size was normal in size Pericardium: There is no evidence of pericardial effusion. Mitral Valve: The mitral valve is normal in structure. Trivial mitral  valve regurgitation. No evidence of mitral valve stenosis by observation. Tricuspid Valve: The tricuspid valve is normal in structure. Tricuspid valve regurgitation is mild. Aortic Valve: The aortic valve is tricuspid. . There is moderate thickening and moderate calcification of the aortic valve. Aortic valve regurgitation is not visualized. The aortic valve is structurally normal, with no evidence of sclerosis or stenosis. There is moderate thickening of the aortic valve. There is moderate calcification of the aortic valve. Pulmonic Valve: The pulmonic valve was normal in structure. Pulmonic valve regurgitation is not visualized. Pulmonic regurgitation is not visualized. Aorta: The aortic root, ascending aorta and aortic arch are all structurally normal, with no evidence of dilitation or obstruction. Venous: The inferior vena cava is normal in size with greater than 50% respiratory variability, suggesting right atrial pressure of 3 mmHg. IAS/Shunts: No atrial level shunt detected by color flow Doppler. There is no evidence of a patent foramen ovale. No ventricular septal defect is seen or detected. There is no evidence of an atrial septal defect.  LEFT VENTRICLE PLAX 2D LVIDd:         4.20 cm  Diastology LVIDs:         3.00 cm  LV e' lateral: 13.40 cm/s LV PW:         1.20 cm  LV e' medial:  6.20 cm/s LV IVS:        1.20 cm LVOT diam:  1.90 cm LV SV:         44 ml LV SV Index:   20.45 LVOT Area:     2.84 cm  RIGHT VENTRICLE RV S prime:     13.30 cm/s TAPSE (M-mode): 2.3 cm LEFT ATRIUM             Index       RIGHT ATRIUM           Index LA diam:        3.40 cm 1.61 cm/m  RA Area:     12.10 cm LA Vol (A2C):   56.0 ml 26.57 ml/m RA Volume:   28.80 ml  13.66 ml/m LA Vol (A4C):   33.7 ml 15.99 ml/m LA Biplane Vol: 44.3 ml 21.02 ml/m  AORTIC VALVE LVOT Vmax:   54.80 cm/s LVOT Vmean:  41.400 cm/s LVOT VTI:    0.150 m  AORTA Ao Root diam: 2.80 cm TRICUSPID VALVE TR Peak grad:   24.4 mmHg TR Vmax:        247.00  cm/s  SHUNTS Systemic VTI:  0.15 m Systemic Diam: 1.90 cm  Fransico Him MD Electronically signed by Fransico Him MD Signature Date/Time: 01/29/2019/4:31:22 AM    Final (Updated)    CT HEAD CODE STROKE WO CONTRAST  Result Date: 01/28/2019 CLINICAL DATA:  Code stroke. Stroke. Slurred speech left facial droop EXAM: CT HEAD WITHOUT CONTRAST TECHNIQUE: Contiguous axial images were obtained from the base of the skull through the vertex without intravenous contrast. COMPARISON:  CT head 12/11/2017 FINDINGS: Brain: Moderate atrophy unchanged. Negative for hydrocephalus. Negative for acute cortical infarct, hemorrhage, mass. Hypodensity in the central lower pons could represent artifact versus acute infarct. Vascular: Negative for hyperdense vessel Skull: Negative Sinuses/Orbits: Chronic opacification left maxillary sinus with bony thickening unchanged. Mucosal edema left frontal and ethmoid sinus. Negative orbit. Other: None ASPECTS (Gutierrez Stroke Program Early CT Score) - Ganglionic level infarction (caudate, lentiform nuclei, internal capsule, insula, M1-M3 cortex): 7 - Supraganglionic infarction (M4-M6 cortex): 3 Total score (0-10 with 10 being normal): 10 IMPRESSION: 1. No acute cortical infarct or hemorrhage. 2. Hypodensity in the pons could be artifact. 3. ASPECTS is 10 4. These results were called by telephone at the time of interpretation on 01/28/2019 at 9:36 pm to provider [facility] , who verbally acknowledged these results. Electronically Signed   By: Franchot Gallo M.D.   On: 01/28/2019 21:37    Procedures .Critical Care Performed by: Hayden Rasmussen, MD Authorized by: Hayden Rasmussen, MD   Critical care provider statement:    Critical care time (minutes):  60   Critical care time was exclusive of:  Separately billable procedures and treating other patients   Critical care was necessary to treat or prevent imminent or life-threatening deterioration of the following conditions:  Cardiac  failure and CNS failure or compromise   Critical care was time spent personally by me on the following activities:  Discussions with consultants, evaluation of patient's response to treatment, examination of patient, ordering and performing treatments and interventions, ordering and review of laboratory studies, ordering and review of radiographic studies, pulse oximetry, re-evaluation of patient's condition, obtaining history from patient or surrogate, review of old charts and development of treatment plan with patient or surrogate   I assumed direction of critical care for this patient from another provider in my specialty: no     (including critical care time)  Medications Ordered in ED Medications  acetaminophen (TYLENOL) tablet 650 mg (has  no administration in time range)    Or  acetaminophen (TYLENOL) 160 MG/5ML solution 650 mg (has no administration in time range)    Or  acetaminophen (TYLENOL) suppository 650 mg (has no administration in time range)  aspirin 81 MG chewable tablet (has no administration in time range)  ticagrelor (BRILINTA) 90 MG tablet (has no administration in time range)  clopidogrel (PLAVIX) 300 MG tablet (has no administration in time range)  eptifibatide (INTEGRILIN) 20 MG/10ML injection (has no administration in time range)  nitroGLYCERIN 100 mcg/mL intra-arterial injection (has no administration in time range)  iohexol (OMNIPAQUE) 300 MG/ML solution 100 mL (has no administration in time range)  ondansetron (ZOFRAN) injection 4 mg (4 mg Intravenous Given 01/29/19 0217)  clevidipine (CLEVIPREX) infusion 0.5 mg/mL (0 mg/hr Intravenous Not Given 01/29/19 0156)  insulin aspart (novoLOG) injection 0-15 Units (8 Units Subcutaneous Given 01/29/19 0843)  chlorhexidine (PERIDEX) 0.12 % solution 15 mL (15 mLs Mouth Rinse Given 01/29/19 0906)  MEDLINE mouth rinse (has no administration in time range)  senna-docusate (Senokot-S) tablet 1 tablet (has no administration in  time range)  aspirin tablet 325 mg (325 mg Per Tube Given 01/29/19 0437)  Chlorhexidine Gluconate Cloth 2 % PADS 6 each (6 each Topical Not Given 01/29/19 0833)  norepinephrine (LEVOPHED) 4mg  in premix infusion (18 mcg/min Intravenous New Bag/Given 01/29/19 0914)  heparin ADULT infusion 100 units/mL (25000 units/259mL sodium chloride 0.45%) (1,000 Units/hr Intravenous Rate/Dose Verify 01/29/19 0900)  iohexol (OMNIPAQUE) 350 MG/ML injection 100 mL (100 mLs Intravenous Contrast Given 01/28/19 2143)   stroke: mapping our early stages of recovery book ( Does not apply Given 01/29/19 0332)  tirofiban (AGGRASTAT) 5-0.9 MG/100ML-% injection (  Duplicate 01/29/19 0156)  fentaNYL (SUBLIMAZE) 100 MCG/2ML injection (  Override pull for Anesthesia 01/28/19 2251)  ceFAZolin (ANCEF) 2-4 GM/100ML-% IVPB (  Duplicate 01/29/19 0155)  verapamil (ISOPTIN) 2.5 MG/ML injection (  Override pull for Anesthesia 01/28/19 2316)  ticagrelor (BRILINTA) tablet (180 mg Per NG tube Given 01/28/19 2310)  aspirin EC tablet (81 mg Oral Given 01/28/19 2310)  eptifibatide (INTEGRILIN) injection (1.5 mg Intravenous Given 01/29/19 0001)  nitroGLYCERIN 1 mg/10 mL (100 mcg/mL) - IR/CATH LAB (25 mcg Intra-arterial Given 01/29/19 0007)  iohexol (OMNIPAQUE) 300 MG/ML solution 150 mL (100 mLs Intra-arterial Contrast Given 01/29/19 0047)  iohexol (OMNIPAQUE) 300 MG/ML solution 50 mL (26 mLs Intra-arterial Contrast Given 01/29/19 0047)  phenylephrine (NEOSYNEPHRINE) 10-0.9 MG/250ML-% infusion (  Duplicate 01/29/19 0156)  calcium gluconate 1 g/ 50 mL sodium chloride IVPB (1,000 mg Intravenous New Bag/Given 01/29/19 0331)  PERFLUTREN LIPID MICROSPHERE injection SUSP (4 mLs Intravenous Given 01/29/19 0301)    ED Course  I have reviewed the triage vital signs and the nursing notes.  Pertinent labs & imaging results that were available during my care of the patient were reviewed by me and considered in my medical decision making (see  chart for details).  Clinical Course as of Jan 28 1001  Wed Jan 28, 2019  2117 Due to unclear nature of the timeline of events have activated him as a code stroke.  Neurology aware and at bedside.  Dr. Wilford Corner.    [MB]  2118 Differential includes stroke, bleed, metabolic derangement, Covid, ACS, arrhythmia, dissection   [MB]  2128 Patient's EKG looks like he is having an anterior MI.  Neurology is reaching out to family to try to get a better sense of what his baseline function is and the timeline of events.   [MB]  2145 Neurology  is discussing with family timing and baseline level of functionality.  It appears he has a acute right ICA occlusion with a large area of brain to potentially salvage.  He is reaching out to interventional radiology.  I have talked with Dr. Katrinka Blazing from interventional cardiology regarding the patient's EKG he said he would review and get back to me.   [MB]  2154 Discussed with cardiology Dr. Katrinka Blazing.  He feels that the EKG possibly could represent an acute event although also could be a prior event with aneurysmal dilation which may have increased his risk for having embolic event.  Dr. Katrinka Blazing said he would not take this patient emergently to Cath Lab without any indication of having any chest pain.  Dr. Pecolia Ades neurology in the loop and is getting neuro intervention for procedure on the patient's right ICA.   [MB]    Clinical Course User Index [MB] Terrilee Files, MD   MDM Rules/Calculators/A&P                      Final Clinical Impression(s) / ED Diagnoses Final diagnoses:  Acute CVA (cerebrovascular accident) (HCC)  Abnormal ECG  AKI (acute kidney injury) (HCC)  Encounter for nasogastric (NG) tube placement  Stroke due to embolism [facility])    Rx / DC Orders ED Discharge Orders    None       Terrilee Files, MD 01/29/19 1007

## 2019-01-28 NOTE — Anesthesia Procedure Notes (Signed)
Arterial Line Insertion Start/End12/16/2020 10:55 PM Performed by: Valetta Fuller, CRNA, CRNA  Preanesthetic checklist: patient identified, IV checked, surgical consent, monitors and equipment checked, pre-op evaluation and timeout performed Patient sedated Left, radial was placed Catheter size: 20 G Hand hygiene performed  and maximum sterile barriers used   Attempts: 1 Procedure performed without using ultrasound guided technique. Following insertion, dressing applied and Biopatch. Patient tolerated the procedure well with no immediate complications.

## 2019-01-28 NOTE — Anesthesia Procedure Notes (Addendum)
Procedure Name: Intubation Date/Time: 01/28/2019 10:40 PM Performed by: Claris Che, CRNA Pre-anesthesia Checklist: Patient identified, Emergency Drugs available, Suction available, Patient being monitored and Timeout performed Patient Re-evaluated:Patient Re-evaluated prior to induction Oxygen Delivery Method: Ambu bag Preoxygenation: Pre-oxygenation with 100% oxygen Induction Type: IV induction, Rapid sequence and Cricoid Pressure applied Laryngoscope Size: Mac and 4 Grade View: Grade III Tube type: Oral Tube size: 7.5 mm Number of attempts: 1 Airway Equipment and Method: Stylet Placement Confirmation: ETT inserted through vocal cords under direct vision,  CO2 detector and breath sounds checked- equal and bilateral Secured at: 24 cm Tube secured with: Tape Dental Injury: Teeth and Oropharynx as per pre-operative assessment

## 2019-01-28 NOTE — ED Triage Notes (Signed)
Pt BIB [facility] from home, family reports pt has been altered from baseline since Monday. Pt has been non-compliant with meds. EMS unsure pt neuro baseline. Family reports pt had a fall today at 1700. On EMS arrival, pt has right sided gaze and garbled speech. C-collar placed by EMS. EMS VS: SBP 100, HR 60, CBG 282

## 2019-01-28 NOTE — Sedation Documentation (Signed)
Pt under the care of anesthesia  

## 2019-01-28 NOTE — H&P (Signed)
NEUROLOGY H&P   CC: left hemiplegia  History is obtained from: patient  HPI: Adam Leon is a 85 y.o. male PMH of HLD, HTN, DM, brought in for concern of weakness after fall. According to family he has been not at his normal baseline for now about two weeks, feeling generally weak and withdrawn. This evening, he had a fall, and was brought in by EMS for eval. EMS was told by family that LKW was Monday but on reconnecting with family - he was unwell but was still talking and walking till sometime today morning. Exact LKW was unable to be ascertained but no stroke symptoms noted by family till the fall today. EDP evaluated the patient, found him to be plegic on left, facial droop on left and asked me if a code stroke should be activated. I recommended code stroke activation, assessed patient and spoke to family over the phone multiple times to get history. [name], daughter, was the one I spoke to and she is the one who provided me with the history in conjunction with her brother over the phone.  Exact LKW was not ascertainable but I was told that he did walk up and down the stairs but did not feel like he had good appetite, and did not want to do much other than lay on the couch. At baseline, up till 2 weeks ago, he was walking without a walker, did bathe and feed himself and only had mild memory problems. Two weeks ago he had a ER evaluation for fatigue and decreased appetite, but most of the work up remained unremarkable and he was d/c'd home from ER. He did have low grade fever and leukopenia at the time. COVID test at the time was negative. No recent COVID exposures reported. On EDP and my eval - he was VAN positive. Significant delay in obtaining information about LKW as the initial report per EMS was no clear LKW, at best 2 days ago, but as above, that turns out to be incorrect based on my conversations with family when asked detailed questions about his ambulation etc.  EKG in ER  concerning for anterior MI. EDP Dr. Charm Barges discussed with cardiology - likely subacute insult - no need for intervention. Formal consult to follow.   LKW: within last 24h (sometime in the AM of 01/28/2019) tpa given?: no, could not confirm LKW within 4.5h Premorbid modified Rankin scale (mRS): 1   ROS: Unable to reliably ascertain from patient  Past Medical History:  Diagnosis Date  . Hypercholesteremia   . Syncope   . Syncope   . Ventricular hypertrophy     No family history on file.   Social History:   reports that he has never smoked. He has never used smokeless tobacco. No history on file for alcohol and drug.  Medications No current facility-administered medications for this encounter.  Current Outpatient Medications:  .  aspirin 81 MG tablet, Take 81 mg by mouth daily., Disp: , Rfl:  .  atenolol (TENORMIN) 25 MG tablet, Take 25 mg by mouth daily., Disp: , Rfl:  .  lisinopril (PRINIVIL,ZESTRIL) 5 MG tablet, Take 5 mg by mouth daily., Disp: , Rfl:  .  metFORMIN (GLUCOPHAGE-XR) 500 MG 24 hr tablet, Take 500 mg by mouth 2 (two) times daily., Disp: , Rfl:  .  pravastatin (PRAVACHOL) 40 MG tablet, Take 40 mg by mouth daily., Disp: , Rfl:    Exam: Current vital signs: BP (!) 99/58 (BP Location: Right Arm)  Pulse 75   Temp (!) 96.7 F (35.9 C) (Temporal)   Resp (!) 21   SpO2 99%  Vital signs in last 24 hours: Temp:  [96.7 F (35.9 C)] 96.7 F (35.9 C) (12/16 2109) Pulse Rate:  [75] 75 (12/16 2109) Resp:  [21] 21 (12/16 2109) BP: (99)/(58) 99/58 (12/16 2109) SpO2:  [99 %] 99 % (12/16 2109) Gen: awake, alert, in no distress HEENT: Neck in c-collar, [place] AT CVS: Irregular rhythm, no MRG Resp: scattered rales Ext no edema Neurological Awake, alert, oriented to self. Said month July. Age wrong by one year. Told us he was in [employer] Did now know where he was. Speech moderately dysarthric Slight hesitancy in naming objects. Poor attention concentration CN: PERRL,  EOM exam with right gaze preference, not forced, blinks to threat from both sides, able to come to midline, left facial paralysis complete, tongue palate midline Motor: Flaccid LUE and LLE. RLE also weak 2/5. RUE no drift Sensory: complete sensory loss on left with neglect Coord - difficult to assess  Gait deferred NIHSS - 22 1a Level of Conscious.: 0 1b LOC Questions: 2 1c LOC Commands:0  2 Best Gaze: 1 3 Visual: 0 4 Facial Palsy: 3 5a Motor Arm - left: 4 5b Motor Arm - Right: 0 6a Motor Leg - Left: 4 6b Motor Leg - Right: 2 7 Limb Ataxia: 0 8 Sensory: 2 9 Best Language: 0 10 Dysarthria: 2 11 Extinct. and Inatten.: 2 TOTAL: 22  Labs I have reviewed labs in epic and the results pertinent to this consultation are:  CBC    Component Value Date/Time   WBC 15.0 (H) 01/28/2019 2120   RBC 4.66 01/28/2019 2120   HGB 13.6 01/28/2019 2126   HCT 40.0 01/28/2019 2126   PLT 359 01/28/2019 2120   MCV 83.3 01/28/2019 2120   MCH 27.7 01/28/2019 2120   MCHC 33.2 01/28/2019 2120   RDW 12.5 01/28/2019 2120   LYMPHSABS 0.9 01/28/2019 2120   MONOABS 1.7 (H) 01/28/2019 2120   EOSABS 0.0 01/28/2019 2120   BASOSABS 0.0 01/28/2019 2120    CMP     Component Value Date/Time   NA 137 01/15/2019 1845   K 3.5 01/15/2019 1845   CL 101 01/15/2019 1845   CO2 23 01/15/2019 1845   GLUCOSE 171 (H) 01/15/2019 1845   BUN 14 01/15/2019 1845   CREATININE 1.09 01/15/2019 1845   CALCIUM 8.9 01/15/2019 1845   PROT 6.9 01/15/2019 1845   ALBUMIN 3.7 01/15/2019 1845   AST 24 01/15/2019 1845   ALT 19 01/15/2019 1845   ALKPHOS 99 01/15/2019 1845   BILITOT 0.7 01/15/2019 1845   GFRNONAA >60 01/15/2019 1845   GFRAA >60 01/15/2019 1845   Imaging I have reviewed the images obtained: CT-scan of the brain-ASPECTS 10, no dense vessel, no bleed CTA- RICA occulsion at the bifurcation. CTP - 13cc core, >200 penumbra  Assessment:  88/M with PMH of HTN HLD DM, with unclear LKW, but within somewhere >4.5h  and <24h, with sudden onset of left sided weakness, seen after family witnessed him falling. C-spine CT negative for fracture. Exam consistent with left cerebral stroke, LVO +ve CT ASPECTS 10 CTA with RICA occlusion right after bifurcation CTP with 13cc core in watershed area, with large penumbra. D/W IR-Dr. Corliss Skainseveshwar and family and daughter agreed to proceed with intervention. No tPA due to unable to confirm LKW within 4.5h. EKG with concern for anterior MI. Cardiology consulted by ER. No emergent cardiac intervention at  this time.  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 -Hyperglycemia management per SSI to maintain glucose 140-180mg /dL. -PT/OT/ST therapies and recommendations when able  CNS -Close neuro monitoring  Dysarthria Dysphagia following cerebral infarction  -NPO until cleared by speech -ST -May need PEG  Hemiplegia and hemiparesis following cerebral infarction affecting left non-dominant side  -PT/OT -PM&R consult  Toxic encephalopathy -Correct metabolic causes -Monitor  RESP Intubated for IR  -vent management per ICU -wean when able  CV BP goal as above.  Acute MI - trop >15000 STEMI -Cards consult - no acute intervention per conversation with EDP. Re-paged for recs after trops.  Spoke with Dr. Radford Pax.  Reviewed strips.  Recommends echo after carotid revascularization.  Hyperlipidemia, unspecified  - Statin for goal LDL < 70  HEME No acute issues -Monitor -transfuse for hgb < 7  ENDO Type 2 diabetes mellitus with hyperglycemia  -SSI -goal HgbA1c < 7  GI/GU ESRD Acute Kidney Failure -Gentle hydration -avoid nephrotoxic agents -would appreciate PCCM  recs  Fluid/Electrolyte Disorders -Repeat labs -replete as needed  ID Possible Aspiration PNA -CXR -NPO -Monitor  Prophylaxis DVT: scd  GI: doc senna Bowel: PPI  Diet: NPO until cleared by speech  Code Status: Full Code     THE FOLLOWING WERE PRESENT ON ADMISSION: Acute myocardial infarction Acute ischemic stroke Possible aspiration pneumonia Hemiplegia  -- Amie Portland, MD [facility] Pager: [phone] If 7pm to 7am, please call on call as listed on AMION.   CRITICAL CARE ATTESTATION Performed by: Amie Portland, MD Total critical care time: 70 minutes Critical care time was exclusive of separately billable procedures and treating other patients and/or supervising APPs/Residents/Students Critical care was necessary to treat or prevent imminent or life-threatening deterioration due to acute ischemic stroke, acute myocardial infarction, carotid occlusion This patient is critically ill and at significant risk for neurological worsening and/or death and care requires constant monitoring. Critical care was time spent personally by me on the following activities: development of treatment plan with patient and/or surrogate as well as nursing, discussions with consultants, evaluation of patient's response to treatment, examination of patient, obtaining history from patient or surrogate, ordering and performing treatments and interventions, ordering and review of laboratory studies, ordering and review of radiographic studies, pulse oximetry, re-evaluation of patient's condition, participation in multidisciplinary rounds and medical decision making of high complexity in the care of this patient.

## 2019-01-29 ENCOUNTER — Inpatient Hospital Stay (HOSPITAL_COMMUNITY): Payer: Medicare HMO

## 2019-01-29 ENCOUNTER — Inpatient Hospital Stay: Payer: Self-pay

## 2019-01-29 DIAGNOSIS — I6521 Occlusion and stenosis of right carotid artery: Secondary | ICD-10-CM | POA: Diagnosis present

## 2019-01-29 DIAGNOSIS — R7989 Other specified abnormal findings of blood chemistry: Secondary | ICD-10-CM | POA: Diagnosis present

## 2019-01-29 DIAGNOSIS — I420 Dilated cardiomyopathy: Secondary | ICD-10-CM

## 2019-01-29 DIAGNOSIS — I9589 Other hypotension: Secondary | ICD-10-CM | POA: Diagnosis not present

## 2019-01-29 DIAGNOSIS — R9431 Abnormal electrocardiogram [ECG] [EKG]: Secondary | ICD-10-CM

## 2019-01-29 DIAGNOSIS — I213 ST elevation (STEMI) myocardial infarction of unspecified site: Secondary | ICD-10-CM

## 2019-01-29 DIAGNOSIS — I639 Cerebral infarction, unspecified: Secondary | ICD-10-CM

## 2019-01-29 DIAGNOSIS — R778 Other specified abnormalities of plasma proteins: Secondary | ICD-10-CM

## 2019-01-29 DIAGNOSIS — I361 Nonrheumatic tricuspid (valve) insufficiency: Secondary | ICD-10-CM

## 2019-01-29 DIAGNOSIS — E861 Hypovolemia: Secondary | ICD-10-CM | POA: Diagnosis not present

## 2019-01-29 DIAGNOSIS — R570 Cardiogenic shock: Secondary | ICD-10-CM

## 2019-01-29 DIAGNOSIS — I5181 Takotsubo syndrome: Secondary | ICD-10-CM

## 2019-01-29 DIAGNOSIS — I63131 Cerebral infarction due to embolism of right carotid artery: Secondary | ICD-10-CM

## 2019-01-29 DIAGNOSIS — N179 Acute kidney failure, unspecified: Secondary | ICD-10-CM | POA: Diagnosis present

## 2019-01-29 HISTORY — PX: IR PERCUTANEOUS ART THROMBECTOMY/INFUSION INTRACRANIAL INC DIAG ANGIO: IMG6087

## 2019-01-29 HISTORY — PX: IR ANGIO VERTEBRAL SEL SUBCLAVIAN INNOMINATE UNI R MOD SED: IMG5365

## 2019-01-29 HISTORY — PX: IR ANGIO INTRA EXTRACRAN SEL COM CAROTID INNOMINATE UNI L MOD SED: IMG5358

## 2019-01-29 HISTORY — PX: IR CT HEAD LTD: IMG2386

## 2019-01-29 LAB — CBC WITH DIFFERENTIAL/PLATELET
Abs Immature Granulocytes: 0.41 10*3/uL — ABNORMAL HIGH (ref 0.00–0.07)
Abs Immature Granulocytes: 0.32 10*3/uL — ABNORMAL HIGH (ref 0.00–0.07)
Basophils Absolute: 0 10*3/uL (ref 0.0–0.1)
Basophils Absolute: 0 10*3/uL (ref 0.0–0.1)
Basophils Relative: 0 %
Basophils Relative: 0 %
Eosinophils Absolute: 0 10*3/uL (ref 0.0–0.5)
Eosinophils Absolute: 0 10*3/uL (ref 0.0–0.5)
Eosinophils Relative: 0 %
Eosinophils Relative: 0 %
HCT: 35.8 % — ABNORMAL LOW (ref 39.0–52.0)
HCT: 33.2 % — ABNORMAL LOW (ref 39.0–52.0)
Hemoglobin: 11.8 g/dL — ABNORMAL LOW (ref 13.0–17.0)
Hemoglobin: 11.3 g/dL — ABNORMAL LOW (ref 13.0–17.0)
Immature Granulocytes: 2 %
Immature Granulocytes: 2 %
Lymphocytes Relative: 4 %
Lymphocytes Relative: 7 %
Lymphs Abs: 0.8 10*3/uL (ref 0.7–4.0)
Lymphs Abs: 1.3 10*3/uL (ref 0.7–4.0)
MCH: 27.4 pg (ref 26.0–34.0)
MCH: 27.7 pg (ref 26.0–34.0)
MCHC: 33 g/dL (ref 30.0–36.0)
MCHC: 34 g/dL (ref 30.0–36.0)
MCV: 83.1 fL (ref 80.0–100.0)
MCV: 81.4 fL (ref 80.0–100.0)
Monocytes Absolute: 1.7 10*3/uL — ABNORMAL HIGH (ref 0.1–1.0)
Monocytes Absolute: 2.2 10*3/uL — ABNORMAL HIGH (ref 0.1–1.0)
Monocytes Relative: 10 %
Monocytes Relative: 12 %
Neutro Abs: 14.5 10*3/uL — ABNORMAL HIGH (ref 1.7–7.7)
Neutro Abs: 14.8 10*3/uL — ABNORMAL HIGH (ref 1.7–7.7)
Neutrophils Relative %: 84 %
Neutrophils Relative %: 79 %
Platelets: 406 10*3/uL — ABNORMAL HIGH (ref 150–400)
Platelets: 428 10*3/uL — ABNORMAL HIGH (ref 150–400)
RBC: 4.31 MIL/uL (ref 4.22–5.81)
RBC: 4.08 MIL/uL — ABNORMAL LOW (ref 4.22–5.81)
RDW: 12.5 % (ref 11.5–15.5)
RDW: 12.7 % (ref 11.5–15.5)
WBC: 17.4 10*3/uL — ABNORMAL HIGH (ref 4.0–10.5)
WBC: 18.6 10*3/uL — ABNORMAL HIGH (ref 4.0–10.5)
nRBC: 0 % (ref 0.0–0.2)
nRBC: 0 % (ref 0.0–0.2)

## 2019-01-29 LAB — BRAIN NATRIURETIC PEPTIDE: B Natriuretic Peptide: 4321.1 pg/mL — ABNORMAL HIGH (ref 0.0–100.0)

## 2019-01-29 LAB — HEPATIC FUNCTION PANEL
ALT: 50 U/L — ABNORMAL HIGH (ref 0–44)
AST: 68 U/L — ABNORMAL HIGH (ref 15–41)
Albumin: 2 g/dL — ABNORMAL LOW (ref 3.5–5.0)
Alkaline Phosphatase: 146 U/L — ABNORMAL HIGH (ref 38–126)
Bilirubin, Direct: 0.6 mg/dL — ABNORMAL HIGH (ref 0.0–0.2)
Indirect Bilirubin: 1 mg/dL — ABNORMAL HIGH (ref 0.3–0.9)
Total Bilirubin: 1.6 mg/dL — ABNORMAL HIGH (ref 0.3–1.2)
Total Protein: 5.4 g/dL — ABNORMAL LOW (ref 6.5–8.1)

## 2019-01-29 LAB — POCT I-STAT 7, (LYTES, BLD GAS, ICA,H+H)
Acid-base deficit: 5 mmol/L — ABNORMAL HIGH (ref 0.0–2.0)
Bicarbonate: 17.7 mmol/L — ABNORMAL LOW (ref 20.0–28.0)
Calcium, Ion: 1.15 mmol/L (ref 1.15–1.40)
HCT: 33 % — ABNORMAL LOW (ref 39.0–52.0)
Hemoglobin: 11.2 g/dL — ABNORMAL LOW (ref 13.0–17.0)
O2 Saturation: 96 %
Patient temperature: 97.4
Potassium: 3.8 mmol/L (ref 3.5–5.1)
Sodium: 137 mmol/L (ref 135–145)
TCO2: 19 mmol/L — ABNORMAL LOW (ref 22–32)
pCO2 arterial: 26.3 mmHg — ABNORMAL LOW (ref 32.0–48.0)
pH, Arterial: 7.434 (ref 7.350–7.450)
pO2, Arterial: 74 mmHg — ABNORMAL LOW (ref 83.0–108.0)

## 2019-01-29 LAB — GLUCOSE, CAPILLARY
Glucose-Capillary: 241 mg/dL — ABNORMAL HIGH (ref 70–99)
Glucose-Capillary: 262 mg/dL — ABNORMAL HIGH (ref 70–99)
Glucose-Capillary: 165 mg/dL — ABNORMAL HIGH (ref 70–99)
Glucose-Capillary: 129 mg/dL — ABNORMAL HIGH (ref 70–99)
Glucose-Capillary: 96 mg/dL (ref 70–99)
Glucose-Capillary: 118 mg/dL — ABNORMAL HIGH (ref 70–99)

## 2019-01-29 LAB — LIPID PANEL
Cholesterol: 123 mg/dL (ref 0–200)
HDL: <10 mg/dL — ABNORMAL LOW (ref >40)
Triglycerides: 244 mg/dL — ABNORMAL HIGH (ref <150)
VLDL: 49 mg/dL — ABNORMAL HIGH (ref 0–40)

## 2019-01-29 LAB — HEPARIN LEVEL (UNFRACTIONATED): Heparin Unfractionated: <0.1 IU/mL — ABNORMAL LOW (ref 0.30–0.70)

## 2019-01-29 LAB — ECHOCARDIOGRAM COMPLETE
Height: 72 in
Weight: 3120 oz

## 2019-01-29 LAB — BASIC METABOLIC PANEL
Anion gap: 15 (ref 5–15)
BUN: 47 mg/dL — ABNORMAL HIGH (ref 8–23)
CO2: 12 mmol/L — ABNORMAL LOW (ref 22–32)
Calcium: 7.4 mg/dL — ABNORMAL LOW (ref 8.9–10.3)
Chloride: 103 mmol/L (ref 98–111)
Creatinine, Ser: 1.94 mg/dL — ABNORMAL HIGH (ref 0.61–1.24)
GFR calc Af Amer: 35 mL/min — ABNORMAL LOW (ref >60)
GFR calc non Af Amer: 30 mL/min — ABNORMAL LOW (ref >60)
Glucose, Bld: 285 mg/dL — ABNORMAL HIGH (ref 70–99)
Potassium: 4 mmol/L (ref 3.5–5.1)
Sodium: 130 mmol/L — ABNORMAL LOW (ref 135–145)

## 2019-01-29 LAB — MRSA PCR SCREENING: MRSA by PCR: NEGATIVE

## 2019-01-29 LAB — TROPONIN I (HIGH SENSITIVITY)
Troponin I (High Sensitivity): 15715 ng/L (ref <18)
Troponin I (High Sensitivity): 11893 ng/L (ref <18)

## 2019-01-29 LAB — HEMOGLOBIN A1C
Hgb A1c MFr Bld: 8.8 % — ABNORMAL HIGH (ref 4.8–5.6)
Mean Plasma Glucose: 205.86 mg/dL

## 2019-01-29 LAB — LACTIC ACID, PLASMA
Lactic Acid, Venous: 2.2 mmol/L (ref 0.5–1.9)
Lactic Acid, Venous: 2.6 mmol/L (ref 0.5–1.9)

## 2019-01-29 MED ORDER — LACTATED RINGERS IV BOLUS
500.0000 mL | Freq: Once | INTRAVENOUS | Status: AC
  Administered 2019-01-29: 500 mL via INTRAVENOUS

## 2019-01-29 MED ORDER — SENNOSIDES-DOCUSATE SODIUM 8.6-50 MG PO TABS: 1.0000 | ORAL_TABLET | Freq: Every evening | ORAL | Status: DC | PRN

## 2019-01-29 MED ORDER — SODIUM CHLORIDE 0.9 % IV SOLN: INTRAVENOUS | Status: DC

## 2019-01-29 MED ORDER — ASPIRIN 325 MG PO TABS: 325.0000 mg | ORAL_TABLET | Freq: Every day | ORAL | Status: DC

## 2019-01-29 MED ORDER — IOHEXOL 300 MG/ML  SOLN
150.0000 mL | Freq: Once | INTRAMUSCULAR | Status: AC | PRN
  Administered 2019-01-29: 100 mL via INTRA_ARTERIAL

## 2019-01-29 MED ORDER — ATORVASTATIN CALCIUM 80 MG PO TABS: 80.0000 mg | ORAL_TABLET | Freq: Every day | ORAL | Status: DC

## 2019-01-29 MED ORDER — LACTATED RINGERS IV SOLN: INTRAVENOUS | Status: DC

## 2019-01-29 MED ORDER — NITROGLYCERIN 1 MG/10 ML FOR IR/CATH LAB
INTRA_ARTERIAL | Status: AC | PRN
  Administered 2019-01-28 – 2019-01-29 (×2): 25 ug via INTRA_ARTERIAL

## 2019-01-29 MED ORDER — SUGAMMADEX SODIUM 200 MG/2ML IV SOLN
INTRAVENOUS | Status: DC | PRN
  Administered 2019-01-29: 200 mg via INTRAVENOUS

## 2019-01-29 MED ORDER — SODIUM CHLORIDE 0.9% FLUSH
10.0000 mL | Freq: Two times a day (BID) | INTRAVENOUS | Status: DC
  Administered 2019-01-29 – 2019-02-10 (×20): 10 mL

## 2019-01-29 MED ORDER — CLEVIDIPINE BUTYRATE 0.5 MG/ML IV EMUL: 0.0000 mg/h | INTRAVENOUS | Status: DC

## 2019-01-29 MED ORDER — ACETAMINOPHEN 160 MG/5ML PO SOLN: 650.0000 mg | ORAL | Status: DC | PRN

## 2019-01-29 MED ORDER — PANTOPRAZOLE SODIUM 40 MG IV SOLR
40.0000 mg | Freq: Two times a day (BID) | INTRAVENOUS | Status: DC
  Administered 2019-01-29 – 2019-02-04 (×13): 40 mg via INTRAVENOUS
  Filled 2019-01-29 (×13): qty 40

## 2019-01-29 MED ORDER — ACETAMINOPHEN 650 MG RE SUPP: 650.0000 mg | RECTAL | Status: DC | PRN

## 2019-01-29 MED ORDER — SODIUM CHLORIDE 0.9% FLUSH: 10.0000 mL | INTRAVENOUS | Status: DC | PRN

## 2019-01-29 MED ORDER — ASPIRIN 325 MG PO TABS
325.0000 mg | ORAL_TABLET | Freq: Every day | ORAL | Status: DC
  Administered 2019-01-29 – 2019-01-30 (×2): 325 mg
  Filled 2019-01-29 (×3): qty 1

## 2019-01-29 MED ORDER — ORAL CARE MOUTH RINSE
15.0000 mL | Freq: Two times a day (BID) | OROMUCOSAL | Status: DC
  Administered 2019-01-29 – 2019-02-10 (×22): 15 mL via OROMUCOSAL

## 2019-01-29 MED ORDER — INSULIN ASPART 100 UNIT/ML ~~LOC~~ SOLN
0.0000 [IU] | SUBCUTANEOUS | Status: DC
  Administered 2019-01-29: 8 [IU] via SUBCUTANEOUS
  Administered 2019-01-29: 2 [IU] via SUBCUTANEOUS
  Administered 2019-01-29: 3 [IU] via SUBCUTANEOUS
  Administered 2019-01-29: 5 [IU] via SUBCUTANEOUS
  Administered 2019-01-30 (×2): 3 [IU] via SUBCUTANEOUS
  Administered 2019-01-30: 2 [IU] via SUBCUTANEOUS
  Administered 2019-01-30 (×2): 3 [IU] via SUBCUTANEOUS
  Administered 2019-01-31 (×3): 2 [IU] via SUBCUTANEOUS
  Administered 2019-01-31 (×2): 3 [IU] via SUBCUTANEOUS
  Administered 2019-02-01 (×2): 5 [IU] via SUBCUTANEOUS
  Administered 2019-02-01 – 2019-02-02 (×4): 2 [IU] via SUBCUTANEOUS
  Administered 2019-02-02 (×3): 3 [IU] via SUBCUTANEOUS
  Administered 2019-02-03: 2 [IU] via SUBCUTANEOUS
  Administered 2019-02-03: 8 [IU] via SUBCUTANEOUS
  Administered 2019-02-03 (×2): 3 [IU] via SUBCUTANEOUS
  Administered 2019-02-04 (×2): 5 [IU] via SUBCUTANEOUS
  Administered 2019-02-04: 3 [IU] via SUBCUTANEOUS
  Administered 2019-02-04: 2 [IU] via SUBCUTANEOUS
  Administered 2019-02-04: 5 [IU] via SUBCUTANEOUS
  Administered 2019-02-05 (×2): 2 [IU] via SUBCUTANEOUS
  Administered 2019-02-05: 3 [IU] via SUBCUTANEOUS

## 2019-01-29 MED ORDER — PHENYLEPHRINE HCL-NACL 10-0.9 MG/250ML-% IV SOLN
INTRAVENOUS | Status: AC
  Filled 2019-01-29: qty 250

## 2019-01-29 MED ORDER — PHENYLEPHRINE HCL-NACL 10-0.9 MG/250ML-% IV SOLN
0.0000 ug/min | INTRAVENOUS | Status: DC
  Administered 2019-01-29: 175 ug/min via INTRAVENOUS
  Administered 2019-01-29: 50 ug/min via INTRAVENOUS
  Filled 2019-01-29 (×3): qty 250

## 2019-01-29 MED ORDER — HEPARIN (PORCINE) 25000 UT/250ML-% IV SOLN
1300.0000 [IU]/h | INTRAVENOUS | Status: DC
  Administered 2019-01-29: 1000 [IU]/h via INTRAVENOUS
  Administered 2019-01-30 – 2019-01-31 (×2): 1200 [IU]/h via INTRAVENOUS
  Administered 2019-02-01: 1300 [IU]/h via INTRAVENOUS
  Filled 2019-01-29 (×5): qty 250

## 2019-01-29 MED ORDER — PERFLUTREN LIPID MICROSPHERE
INTRAVENOUS | Status: AC
  Administered 2019-01-29: 4 mL via INTRAVENOUS
  Filled 2019-01-29: qty 10

## 2019-01-29 MED ORDER — CALCIUM GLUCONATE-NACL 1-0.675 GM/50ML-% IV SOLN
1.0000 g | Freq: Once | INTRAVENOUS | Status: AC
  Administered 2019-01-29: 1000 mg via INTRAVENOUS
  Filled 2019-01-29: qty 50

## 2019-01-29 MED ORDER — VASOPRESSIN 20 UNIT/ML IV SOLN
0.0300 [IU]/min | INTRAVENOUS | Status: DC
  Administered 2019-01-29 – 2019-01-30 (×2): 0.03 [IU]/min via INTRAVENOUS
  Filled 2019-01-29 (×2): qty 2

## 2019-01-29 MED ORDER — ONDANSETRON HCL 4 MG/2ML IJ SOLN
4.0000 mg | Freq: Four times a day (QID) | INTRAMUSCULAR | Status: DC | PRN
  Administered 2019-01-29 – 2019-02-05 (×2): 4 mg via INTRAVENOUS
  Filled 2019-01-29 (×3): qty 2

## 2019-01-29 MED ORDER — IOHEXOL 300 MG/ML  SOLN
50.0000 mL | Freq: Once | INTRAMUSCULAR | Status: AC | PRN
  Administered 2019-01-29: 26 mL via INTRA_ARTERIAL

## 2019-01-29 MED ORDER — CHLORHEXIDINE GLUCONATE 0.12 % MT SOLN
15.0000 mL | Freq: Two times a day (BID) | OROMUCOSAL | Status: DC
  Administered 2019-01-29 – 2019-02-10 (×26): 15 mL via OROMUCOSAL
  Filled 2019-01-29 (×21): qty 15

## 2019-01-29 MED ORDER — ACETAMINOPHEN 325 MG PO TABS: 650.0000 mg | ORAL_TABLET | ORAL | Status: DC | PRN

## 2019-01-29 MED ORDER — CHLORHEXIDINE GLUCONATE CLOTH 2 % EX PADS
6.0000 | MEDICATED_PAD | Freq: Every day | CUTANEOUS | Status: DC
  Administered 2019-01-30 – 2019-02-07 (×8): 6 via TOPICAL

## 2019-01-29 MED ORDER — NOREPINEPHRINE 4 MG/250ML-% IV SOLN
0.0000 ug/min | INTRAVENOUS | Status: DC
  Administered 2019-01-29: 18 ug/min via INTRAVENOUS
  Administered 2019-01-29: 14 ug/min via INTRAVENOUS
  Administered 2019-01-29: 2 ug/min via INTRAVENOUS
  Administered 2019-01-30: 6 ug/min via INTRAVENOUS
  Filled 2019-01-29 (×5): qty 250

## 2019-01-29 NOTE — Progress Notes (Signed)
Patient's wedding band and home medications taken home by daughter [place].

## 2019-01-29 NOTE — Progress Notes (Signed)
ANTICOAGULATION CONSULT NOTE  Pharmacy Consult for Heparin Indication: ACS s/p carotid embolectomy and LV thrombus  No Known Allergies  Patient Measurements: Height: 6' (182.9 cm) Weight: 195 lb (88.5 kg) IBW/kg (Calculated) : 77.6  Vital Signs: Temp: 98.1 F (36.7 C) (12/17 0800) Temp Source: Axillary (12/17 0800) BP: 101/65 (12/17 1600) Pulse Rate: 64 (12/17 1600)  Labs: Recent Labs    01/28/19 2120 01/28/19 2126 01/28/19 2313 01/29/19 0240 01/29/19 1107 01/29/19 1336 01/29/19 1355 01/29/19 1414  HGB 12.9* 13.6  --  11.8*  --  11.2* 11.3*  --   HCT 38.8* 40.0  --  35.8*  --  33.0* 33.2*  --   PLT 359  --   --  406*  --   --  428*  --   HEPARINUNFRC  --   --   --   --   --   --   --  <0.10*  CREATININE 2.32* 2.20*  --  1.94*  --   --   --   --   TROPONINIHS 15,519*  --  15,715*  --  11,893*  --   --   --     Estimated Creatinine Clearance: 30 mL/min (A) (by C-G formula based on SCr of 1.94 mg/dL (H)).    Assessment: 85 y.o. male with EKG changes, elevated cardiac markers and LV thrombus s/p CVA and carotid thrombectomy continues on IV heparin  Heparin level < 0.10 this PM CBC stable  Goal of Therapy:  Heparin level 0.3-0.5 units/mL Monitor platelets by anticoagulation protocol: Yes   Plan:  Increase heparin to 1150 units / hr Check heparin level in 8 hours.   Thank you Anette Guarneri, PharmD [phone] 01/29/2019,4:20 PM

## 2019-01-29 NOTE — Plan of Care (Addendum)
Spoke with Dr. Radford Pax, Cardiologist. Appreciate consult and recs. Per Dr. Radford Pax, 2D echo with Takotsubo's cardiomyopathy with apical ballooning and possible LV thrombus. Recommends IV heparin. New onset atrial fibrillation with RVR has now resolved.  Recommends discontinuing phenylephrine and using Levophed.  Recommendations: -Pharmacy consult for heparin drip without bolus-stroke protocol - will remain high risk for HT, but with the LV thrombus, IV heparin is strongly indicated to prevent another embolic event. -Levophed for systolic blood pressure 1 20-1 40. -Stop IV fluids -Cycle tropes -continue ASA -repeat CTH in the AM or get the MRI to get a sense of the size of the stroke and to ensure there is no HT. Stroke Team will continue to follow  Attempted to call the daughter to update after procedure - left voicemail. Awaiting callback.  -- Amie Portland, MD [facility] Pager: [phone] If 7pm to 7am, please call on call as listed on AMION.

## 2019-01-29 NOTE — Evaluation (Signed)
Occupational Therapy Evaluation Patient Details Name: Adam Leon MRN: [id number] DOB: [birth year] Today's Date: 01/29/2019    History of Present Illness [name] is a 85 y.o. male with history of HLD, HTN, DM presenting following a fall with L sided weakness face, arm and leg. 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 Pt currently  limited by decreased strength, decreased ADL task participation and decreased cognition. Pt highly distracted and cognitively unaware of situation, any orientation other than person and decreased awareness of deficits. Pt modA +2 for bed mobility (due to weakness and lack of command follow); sit to stands with minA +2 for lines and impulsivity. Pt taking a few steps toward HOB. Pt would greatly benefit from continued OT skilled services for ADL, mobility and safety in CIR setting. Pt appears to have the stamina for CIR and good rehab potential. OT following acutely.      Follow Up Recommendations  CIR;Supervision/Assistance - 24 hour    Equipment Recommendations  Other (comment)(to be determined)    Recommendations for Other Services Rehab consult     Precautions / Restrictions Precautions Precautions: Fall Precaution Comments: had NG tube Restrictions Weight Bearing Restrictions: No      Mobility Bed Mobility Overal bed mobility: Needs Assistance Bed Mobility: Supine to Sit;Rolling Rolling: Mod assist   Supine to sit: Mod assist;+2 for physical assistance;+2 for safety/equipment     General bed mobility comments: ModA +2 for initiating movements; once EOB, pt able to tolerate  Transfers Overall transfer level: Needs assistance Equipment used: None Transfers: Sit to/from Stand Sit to Stand:  Min assist;+2 physical assistance;+2 safety/equipment         General transfer comment: assist for stability and to reduce impulsivity    Balance Overall balance assessment: Needs assistance   Sitting balance-Leahy Scale: Fair       Standing balance-Leahy Scale: Poor Standing balance comment: requiring hand held assist for support                           ADL either performed or assessed with clinical judgement   ADL Overall ADL's : Needs assistance/impaired Eating/Feeding: NPO   Grooming: Min guard;Minimal assistance;Standing;Cueing for safety;Cueing for sequencing   Upper Body Bathing: Minimal assistance;Sitting   Lower Body Bathing: Moderate assistance;Sitting/lateral leans;Sit to/from stand;Cueing for safety;Cueing for sequencing   Upper Body Dressing : Minimal assistance;Sitting;Cueing for safety   Lower Body Dressing: Moderate assistance;Cueing for safety;Cueing for sequencing;Sitting/lateral leans;Sit to/from stand   Toilet Transfer: Minimal assistance;Stand-pivot;BSC   Toileting- Clothing Manipulation and Hygiene: Moderate assistance;Cueing for safety;Cueing for sequencing;Sitting/lateral lean       Functional mobility during ADLs: Minimal assistance;+2 for safety/equipment;+2 for physical assistance;Cueing for safety;Cueing for sequencing General ADL Comments: Pt limited by decreased strength, decreased ADL task participation and decreased cognition.     Vision Baseline Vision/History: No visual deficits Vision Assessment?: No apparent visual deficits     Perception     Praxis      Pertinent Vitals/Pain Pain Assessment: Faces Faces Pain Scale: No hurt     Hand Dominance Right   Extremity/Trunk Assessment Upper Extremity Assessment Upper Extremity Assessment: Generalized weakness;RUE deficits/detail;LUE deficits/detail RUE Deficits / Details: 3/5 MM grade shoulder through  digits; mitt on hand LUE Deficits / Details: 3-/5 MM grade  shoulder through digits   Lower Extremity Assessment Lower Extremity Assessment: Generalized weakness   Cervical / Trunk Assessment Cervical / Trunk Assessment: Normal   Communication Communication Communication: No difficulties   Cognition Arousal/Alertness: Awake/alert Behavior During Therapy: Restless;Impulsive Overall Cognitive Status: Impaired/Different from baseline Area of Impairment: Orientation;Attention;Memory;Following commands;Safety/judgement;Awareness;Problem solving                 Orientation Level: Disoriented to;Place;Time;Situation Current Attention Level: Sustained Memory: Decreased short-term memory Following Commands: Follows one step commands inconsistently Safety/Judgement: Decreased awareness of safety;Decreased awareness of deficits Awareness: Intellectual Problem Solving: Slow processing;Decreased initiation;Difficulty sequencing;Requires verbal cues;Requires tactile cues General Comments: Pt talking about "the guys" and living in [place] when asked where he was right now. Daughter reports possible dementia, but not diagnosed for the past 6 months regarding memory.   General Comments  Daughter in room prior to moving pt. VSS. O2 >95% on RA; 122/67 BP post activity, HR 76 BPM    Exercises     Shoulder Instructions      Home Living Family/patient expects to be discharged to:: Private residence Living Arrangements: Spouse/significant other Available Help at Discharge: Family;Available PRN/intermittently Type of Home: House Home Access: Stairs to enter [employer] of Steps: 2 flights   Home Layout: Two level;Bed/bath upstairs Alternate Level Stairs-Number of Steps: full flight   Bathroom Shower/Tub: Producer, television/film/video: Standard     Home Equipment: Medical laboratory scientific officer - single point   Additional Comments: children are nearby; info from daughter in room      Prior Functioning/Environment Level of Independence: Needs  assistance  Gait / Transfers Assistance Needed: last 2-3 weeks, pt was getting worse with mobility and cognition ADL's / Homemaking Assistance Needed: Pt driving, pt's family reports independence with ADL   Comments: Info from daughter as pt talking out of head        OT Problem List: Decreased strength;Decreased activity tolerance;Impaired balance (sitting and/or standing);Decreased safety awareness;Decreased cognition      OT Treatment/Interventions: Self-care/ADL training;Therapeutic exercise;Energy conservation;Therapeutic activities;Patient/family education;Balance training;DME and/or AE instruction;Cognitive remediation/compensation    OT Goals(Current goals can be found in the care plan section) Acute Rehab OT Goals Patient Stated Goal: to go home safely OT Goal Formulation: With family Time For Goal Achievement: 02/12/19 Potential to Achieve Goals: Fair ADL Goals Pt Will Perform Eating: with set-up;sitting Pt Will Perform Grooming: with supervision;sitting Pt Will Perform Lower Body Dressing: with min guard assist;sitting/lateral leans;sit to/from stand Pt Will Transfer to Toilet: ambulating;with supervision Pt/caregiver will Perform Home Exercise Program: Increased strength;Both right and left upper extremity;With Supervision Additional ADL Goal #1: Pt will follow 100% of simple and multi step commands in 3/5 trials.  OT Frequency: Min 2X/week   Barriers to D/C:            Co-evaluation PT/OT/SLP Co-Evaluation/Treatment: Yes Reason for Co-Treatment: Complexity of the patient's impairments (multi-system involvement);Necessary to address cognition/behavior during functional activity   OT goals addressed during session: ADL's and self-care      AM-PAC OT "6 Clicks" Daily Activity     Outcome Measure Help from another person eating meals?: A Lot Help from another person taking care of personal grooming?: A Little Help from another person toileting, which includes  using toliet, bedpan, or urinal?: A Lot Help from another person bathing (including washing, rinsing, drying)?: A Lot Help from another person to put on and taking off regular upper body clothing?: A Little  Help from another person to put on and taking off regular lower body clothing?: A Lot 6 Click Score: 14   End of Session Nurse Communication: Mobility status  Activity Tolerance: Treatment limited secondary to medical complications (Comment);Patient tolerated treatment well Patient left: in bed;with call bell/phone within reach;with bed alarm set;with nursing/sitter in room  OT Visit Diagnosis: Unsteadiness on feet (R26.81);Muscle weakness (generalized) (M62.81)                Time: [id number] OT Time Calculation (min): 27 min Charges:  OT General Charges $OT Visit: 1 Visit OT Evaluation $OT Eval Moderate Complexity: 1 Mod  Cristi LoronAllison (Jelenek) [place] OTR/L Acute Rehabilitation Services Pager: [phone] Office: [phone]   Lonzo CloudLLISON J Terria Deschepper 01/29/2019, 4:53 PM

## 2019-01-29 NOTE — Progress Notes (Addendum)
   I reviewed the patient's EKG last night with the emergency room physician.  EKG demonstrated mid anterior wall ST elevation.  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  2) other possibility could include STEMI (asymptomatic) and CVA simultaneous but not cardiac invasive/intervention appropriate due to acute stroke.  Have been appraised of the thrombectomy of the patient's carotid with a long thrombus removed.  The thrombus is so long that it suggests some chronic nature to the occlusion.  Needs 2D Doppler echocardiogram to assess LV function and rule out LV thrombus.  Continue to monitor cardiac markers.  Suspect acute anterior infarction was days to a week or so ago and are on the way down.  We shall see how markers trend.  No new or specific recommendations this morning.  Addendum:  Echo was done early this morning and does demonstrate LV thrombus.  There is likely a connection.  Systolic function is severely depressed.  Continue IV heparin and transition to coumadin (classic for LV thrombus) or DOAC (no data to my knowledge).  Once further out from CVA will start guideline directed therapy for LV systolic dysfunction.

## 2019-01-29 NOTE — ED Notes (Signed)
Critical result called to Vladimir Faster RN, who communicated result to MD in person

## 2019-01-29 NOTE — Progress Notes (Signed)
Patient ID: Adam Leon, male   DOB: Jan 03, 1931, 85 y.o.   MRN: [id number] INR 25 Y RT H M MRSS 2  LSW? With new onset RT bgaze deviation and LT sided hemiplegia. CT brain NO ICH ASPECTS 10. CTA occluded RT ICA intracranially aand extracranially. CTP largr penumbra with a core of 40ml. Endovascular revascularization treatmentD/W daughter. Reasons,,procedure risks reviwed.Risks of ICH of 10 %,worsening neuro function,death and inability  to revascularize discussed with daughter.She expressed understanding and  Provided consent to treat. S.Kaytelynn Scripter MD

## 2019-01-29 NOTE — Procedures (Signed)
S/P bilateral common carotid and RT Vert arteriogram followed by complete revascularization of occluded extracranial and intracranial RT ICA with x 2 passes with 38mmx 40 mm solitaireX retriever device and penumbra aspiration achieving a TICI 3  Rt MCA revascularization. S.Cayde Held MD

## 2019-01-29 NOTE — Progress Notes (Signed)
Patient hypotensive on arrival to unit from IR. Dr. Rory Percy and CCM notified. Phenylephrine initially ordered to get blood pressure within parameters, multiple titrations before blood pressure was above 650 systolic. After Echo results, patient is now on levophed, titrating phenylephrine down.

## 2019-01-29 NOTE — Progress Notes (Signed)
STROKE TEAM PROGRESS NOTE   INTERVAL HISTORY Pt daughter, RN at bedside. I also discussed with Dr. Katrinka Blazing in the hallway. Pt awake alert but not fully orientated. On NG wall suctioning for coffee ground color emesis. Still has mild right neglect, right sided hemiparesis, but improved from overnight. Still on pressors for BP management. On heparin IV.   Vitals:   01/29/19 0715 01/29/19 0800 01/29/19 0900 01/29/19 1000  BP: 100/62 (!) 106/52 105/88 (!) 107/94  Pulse: 81 71 70 71  Resp: (!) (!) 24  Temp:  98.1 F (36.7 C)    TempSrc:  Axillary    SpO2: 97% 96% 96% 97%  Weight:      Height:        CBC:  Recent Labs  Lab 01/28/19 2120 01/28/19 2126 01/29/19 0240  WBC 15.0*  --  17.4*  NEUTROABS 12.1*  --  14.5*  HGB 12.9* 13.6 11.8*  HCT 38.8* 40.0 35.8*  MCV 83.3  --  83.1  PLT 359  --  406*    Basic Metabolic Panel:  Recent Labs  Lab 01/28/19 2120 01/28/19 2126 01/29/19 0240  NA 133* 134* 130*  K 4.6 4.6 4.0  CL 100 98 103  CO2 18*  --  12*  GLUCOSE 225* 224* 285*  BUN 54* 68* 47*  CREATININE 2.32* 2.20* 1.94*  CALCIUM 8.1*  --  7.4*   Lipid Panel:     Component Value Date/Time   CHOL 123 01/29/2019 0222   TRIG 244 (H) 01/29/2019 0222   HDL <10 (L) 01/29/2019 0222   CHOLHDL NOT CALCULATED 01/29/2019 0222   VLDL 49 (H) 01/29/2019 0222   LDLCALC NOT CALCULATED 01/29/2019 0222   HgbA1c:  Lab Results  Component Value Date   HGBA1C 8.8 (H) 01/29/2019   Urine Drug Screen: No results found for: LABOPIA, COCAINSCRNUR, LABBENZ, AMPHETMU, THCU, LABBARB  Alcohol Level     Component Value Date/Time   ETH <10 01/28/2019 2120    IMAGING CT Code Stroke CTA Head W/WO contrast  Result Date: 01/28/2019 CLINICAL DATA:  Stroke.  Slurred speech.  Left facial droop. EXAM: CT ANGIOGRAPHY HEAD AND NECK CT PERFUSION BRAIN TECHNIQUE: Multidetector CT imaging of the head and neck was performed using the standard protocol during bolus administration of intravenous  contrast. Multiplanar CT image reconstructions and MIPs were obtained to evaluate the vascular anatomy. Carotid stenosis measurements (when applicable) are obtained utilizing NASCET criteria, using the distal internal carotid diameter as the denominator. Multiphase CT imaging of the brain was performed following IV bolus contrast injection. Subsequent parametric perfusion maps were calculated using RAPID software. CONTRAST:  OMNIPAQUE IOHEXOL 350 MG/ML SOLN COMPARISON:  CT head 01/28/2019 FINDINGS: CTA NECK FINDINGS Aortic arch: Proximal great vessels widely patent. Incomplete imaging of the aortic arch. Right carotid system: Right common carotid artery widely patent. 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 Right internal carotid artery remains occluded through the cavernous segment. Reconstitution of the supraclinoid internal carotid artery on the right. Left carotid system: Left carotid bifurcation widely patent without significant stenosis. Vertebral arteries: Both vertebral arteries are patent to the basilar without significant stenosis. Skeleton: No acute skeletal abnormality.  Poor dentition. Other neck: Thyroid goiter.  No mass lesion. Upper chest: Mild pleural thickening along the major fissure on the right. Otherwise lungs clear. Review of the MIP images confirms the above findings CTA HEAD FINDINGS Anterior circulation: Right internal carotid artery is occluded through the cavernous segment with reconstitution of the  supraclinoid segment. Both anterior and middle cerebral arteries are patent without significant stenosis or thrombus. Right anterior and middle cerebral arteries supplied through the anterior communicating artery. No significant posterior communicating artery identified on the right. Left cavernous carotid widely patent. Posterior circulation: Both vertebral arteries patent to the basilar. PICA patent. Basilar widely patent. AICA, superior cerebellar, posterior cerebral  arteries patent bilaterally. Venous sinuses: Minimal venous contrast due to arterial phase scanning Anatomic variants: None Review of the MIP images confirms the above findings CT Brain Perfusion Findings: ASPECTS: 10 CBF (<30%) Volume: 13mL Perfusion (Tmax>6.0s) volume: Mismatch Volume: Infarction Location:Right MCA territory shows diffuse delayed perfusion. Core infarct in the right watershed territory in the deep white matter. IMPRESSION: 1. 13 mL core infarct in the watershed territory on the right. Large area of delayed perfusion right MCA territory due to occlusion of the right internal carotid artery. Right anterior middle cerebral arteries are patent and supplied through the anterior communicating artery. Collateral circulation is most likely the reason for delayed perfusion in the right MCA territory. 2. No other significant intracranial stenosis. 3. These results were called by telephone at the time of interpretation on 01/28/2019 at 9:54 pm to provider [facility] , who verbally acknowledged these results. Electronically Signed   By: Marlan Palau M.D.   On: 01/28/2019 21:56   CT Code Stroke CTA Neck W/WO contrast  Result Date: 01/28/2019 CLINICAL DATA:  Stroke.  Slurred speech.  Left facial droop. EXAM: CT ANGIOGRAPHY HEAD AND NECK CT PERFUSION BRAIN TECHNIQUE: Multidetector CT imaging of the head and neck was performed using the standard protocol during bolus administration of intravenous contrast. Multiplanar CT image reconstructions and MIPs were obtained to evaluate the vascular anatomy. Carotid stenosis measurements (when applicable) are obtained utilizing NASCET criteria, using the distal internal carotid diameter as the denominator. Multiphase CT imaging of the brain was performed following IV bolus contrast injection. Subsequent parametric perfusion maps were calculated using RAPID software. CONTRAST:  OMNIPAQUE IOHEXOL 350 MG/ML SOLN COMPARISON:  CT head 01/28/2019  FINDINGS: CTA NECK FINDINGS Aortic arch: Proximal great vessels widely patent. Incomplete imaging of the aortic arch. Right carotid system: Right common carotid artery widely patent. 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 Right internal carotid artery remains occluded through the cavernous segment. Reconstitution of the supraclinoid internal carotid artery on the right. Left carotid system: Left carotid bifurcation widely patent without significant stenosis. Vertebral arteries: Both vertebral arteries are patent to the basilar without significant stenosis. Skeleton: No acute skeletal abnormality.  Poor dentition. Other neck: Thyroid goiter.  No mass lesion. Upper chest: Mild pleural thickening along the major fissure on the right. Otherwise lungs clear. Review of the MIP images confirms the above findings CTA HEAD FINDINGS Anterior circulation: Right internal carotid artery is occluded through the cavernous segment with reconstitution of the supraclinoid segment. Both anterior and middle cerebral arteries are patent without significant stenosis or thrombus. Right anterior and middle cerebral arteries supplied through the anterior communicating artery. No significant posterior communicating artery identified on the right. Left cavernous carotid widely patent. Posterior circulation: Both vertebral arteries patent to the basilar. PICA patent. Basilar widely patent. AICA, superior cerebellar, posterior cerebral arteries patent bilaterally. Venous sinuses: Minimal venous contrast due to arterial phase scanning Anatomic variants: None Review of the MIP images confirms the above findings CT Brain Perfusion Findings: ASPECTS: 10 CBF (<30%) Volume: 13mL Perfusion (Tmax>6.0s) volume: Mismatch Volume: Infarction Location:Right MCA territory shows diffuse delayed perfusion. Core infarct in  the right watershed territory in the deep white matter. IMPRESSION: 1. 13 mL core infarct in the watershed  territory on the right. Large area of delayed perfusion right MCA territory due to occlusion of the right internal carotid artery. Right anterior middle cerebral arteries are patent and supplied through the anterior communicating artery. Collateral circulation is most likely the reason for delayed perfusion in the right MCA territory. 2. No other significant intracranial stenosis. 3. These results were called by telephone at the time of interpretation on 01/28/2019 at 9:54 pm [facility] , who verbally acknowledged these results. Electronically Signed   By: Marlan Palauharles  Clark M.D.   On: 01/28/2019 21:56   CT C-SPINE NO CHARGE  Result Date: 01/28/2019 CLINICAL DATA:  Trauma.  Rule out spine fracture EXAM: CT CERVICAL SPINE WITHOUT CONTRAST TECHNIQUE: Multidetector CT imaging of the cervical spine was performed without intravenous contrast. Multiplanar CT image reconstructions were also generated. COMPARISON:  None. FINDINGS: Alignment: Normal Skull base and vertebrae: Negative for fracture Soft tissues and spinal canal: Goiter with enlargement of the right lobe of the thyroid. No adenopathy in the neck. Disc levels: Mild disc degeneration and spurring C5-6 with foraminal narrowing bilaterally. Upper chest: Mild thickening of the major fissure on the right. Otherwise lung apices clear. Other: None IMPRESSION: Negative for cervical spine fracture. Electronically Signed   By: Marlan Palauharles  Clark M.D.   On: 01/28/2019 21:59   CT Code Stroke Cerebral Perfusion with contrast  Result Date: 01/28/2019 CLINICAL DATA:  Stroke.  Slurred speech.  Left facial droop. EXAM: CT ANGIOGRAPHY HEAD AND NECK CT PERFUSION BRAIN TECHNIQUE: Multidetector CT imaging of the head and neck was performed using the standard protocol during bolus administration of intravenous contrast. Multiplanar CT image reconstructions and MIPs were obtained to evaluate the vascular anatomy. Carotid stenosis measurements (when applicable) are  obtained utilizing NASCET criteria, using the distal internal carotid diameter as the denominator. Multiphase CT imaging of the brain was performed following IV bolus contrast injection. Subsequent parametric perfusion maps were calculated using RAPID software. CONTRAST:  100mL OMNIPAQUE IOHEXOL 350 MG/ML SOLN COMPARISON:  CT head 01/28/2019 FINDINGS: CTA NECK FINDINGS Aortic arch: Proximal great vessels widely patent. Incomplete imaging of the aortic arch. Right carotid system: Right common carotid artery widely patent. 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 Right internal carotid artery remains occluded through the cavernous segment. Reconstitution of the supraclinoid internal carotid artery on the right. Left carotid system: Left carotid bifurcation widely patent without significant stenosis. Vertebral arteries: Both vertebral arteries are patent to the basilar without significant stenosis. Skeleton: No acute skeletal abnormality.  Poor dentition. Other neck: Thyroid goiter.  No mass lesion. Upper chest: Mild pleural thickening along the major fissure on the right. Otherwise lungs clear. Review of the MIP images confirms the above findings CTA HEAD FINDINGS Anterior circulation: Right internal carotid artery is occluded through the cavernous segment with reconstitution of the supraclinoid segment. Both anterior and middle cerebral arteries are patent without significant stenosis or thrombus. Right anterior and middle cerebral arteries supplied through the anterior communicating artery. No significant posterior communicating artery identified on the right. Left cavernous carotid widely patent. Posterior circulation: Both vertebral arteries patent to the basilar. PICA patent. Basilar widely patent. AICA, superior cerebellar, posterior cerebral arteries patent bilaterally. Venous sinuses: Minimal venous contrast due to arterial phase scanning Anatomic variants: None Review of the MIP images confirms  the above findings CT Brain Perfusion Findings: ASPECTS: 10 CBF (<30%) Volume: 13mL Perfusion (Tmax>6.0s) volume:  Mismatch Volume: Infarction Location:Right MCA territory shows diffuse delayed perfusion. Core infarct in the right watershed territory in the deep white matter. IMPRESSION: 1. 13 mL core infarct in the watershed territory on the right. Large area of delayed perfusion right MCA territory due to occlusion of the right internal carotid artery. Right anterior middle cerebral arteries are patent and supplied through the anterior communicating artery. Collateral circulation is most likely the reason for delayed perfusion in the right MCA territory. 2. No other significant intracranial stenosis. 3. These results were called by telephone at the time of interpretation on 01/28/2019 at 9:54 pm to provider [facility] , who verbally acknowledged these results. Electronically Signed   By: Marlan Palau M.D.   On: 01/28/2019 21:56   DG CHEST PORT 1 VIEW  Result Date: 01/29/2019 CLINICAL DATA:  Stroke due to embolism EXAM: PORTABLE CHEST 1 VIEW COMPARISON:  01/15/2019 FINDINGS: Interstitial coarsening with Charyl Dancer lines. No visible effusion or pneumothorax. Normal heart size and stable mediastinal contours. Enteric tube tip and side-port reaches the stomach. IMPRESSION: Interstitial pulmonary edema. Electronically Signed   By: Marnee Spring M.D.   On: 01/29/2019 05:08   DG Abd Portable 1V  Result Date: 01/29/2019 CLINICAL DATA:  OG tube placement. EXAM: PORTABLE ABDOMEN - 1 VIEW COMPARISON:  None. FINDINGS: Tip of the enteric tube is below the diaphragm in the stomach, the side port is in the region of the gastroesophageal junction. Recommend advancement of least 3 cm for optimal placement. Excreted IV contrast in both renal collecting systems and the urinary bladder from prior IV contrast. Lobular contour of the bladder suggests bladder diverticula or chronic bladder outlet obstruction no  bowel obstruction. Surgical clips in the right upper quadrant likely from cholecystectomy. IMPRESSION: Tip of the enteric tube below the diaphragm in the stomach, the side-port in the region of the gastroesophageal junction. Recommend advancement of least 3 cm for optimal placement. Electronically Signed   By: Narda Rutherford M.D.   On: 01/29/2019 03:27   ECHOCARDIOGRAM COMPLETE  Result Date: 01/29/2019   ECHOCARDIOGRAM REPORT   Patient Name:   ESTELLE SKIBICKI Date of Exam: 01/29/2019 Medical Rec #:  [id number]       Height:       72.0 in Accession #:    [id number]      Weight:       195.0 lb Date of Birth:  Feb 28, 1930       BSA:          2.11 m Patient Age:    85 years        BP:           120/63 mmHg Patient Gender: M               HR:           71 bpm. Exam Location:  Inpatient Procedure: 2D Echo        STAT ECHO Reported to: Dr. Mayford Knife. Indications:     stroke  History:         Patient has no prior history of Echocardiogram examinations.                  Signs/Symptoms:elevated troponin.  Sonographer:     Delcie Roch Referring Phys:  [id number] TRACI R TURNER Diagnosing Phys: Armanda Magic MD IMPRESSIONS  1. Left ventricular ejection fraction, by visual estimation, is <20%. The left ventricle has severely decreased function. Left ventricular septal wall thickness was mildly increased. Mildly  increased left ventricular posterior wall thickness.  2. There is apical ballooning with akinesis of the entire apical wall. There is akinesis of the mid infero and anteroseptum. There is akinesis of the mid and apical inferior, anterior and lateral walls and akineseis of the apical inferolateral wall.  3. Moderate, fixed thrombus on the apical wall of the left ventricle.  4. Definity contrast agent was given IV to delineate the left ventricular endocardial borders.  5. Global right ventricle has normal systolic function.The right ventricular size is normal. No increase in right ventricular wall thickness.  6. Left  atrial size was normal.  7. Right atrial size was normal.  8. The mitral valve is normal in structure. Trivial mitral valve regurgitation. No evidence of mitral stenosis.  9. The tricuspid valve is normal in structure. Tricuspid valve regurgitation is mild. 10. The aortic valve is tricuspid. Aortic valve regurgitation is not visualized. No evidence of aortic valve sclerosis or stenosis. 11. The pulmonic valve was normal in structure. Pulmonic valve regurgitation is not visualized. 12. Normal pulmonary artery systolic pressure. 13. The inferior vena cava is normal in size with greater than 50% respiratory variability, suggesting right atrial pressure of 3 mmHg. 14. Left ventricular diastolic parameters are consistent with Grade I diastolic dysfunction (impaired relaxation). FINDINGS  Left Ventricle: Left ventricular ejection fraction, by visual estimation, is <20%. The left ventricle has severely decreased function. Definity contrast agent was given IV to delineate the left ventricular endocardial borders. Mildly increased left ventricular posterior wall thickness. Left ventricular diastolic parameters are consistent with Grade I diastolic dysfunction (impaired relaxation). There is a moderate, fixed, apical left ventricular thrombus. The thrombus appears flat (mural) in shape.  There is apical ballooning with akinesis of the entire apical wall. There is akinesis of the mid infero and anteroseptum. There is akinesis of the mid and apical inferior, anterior and lateral walls and akineseis of the apical inferolateral wall. Right Ventricle: The right ventricular size is normal. No increase in right ventricular wall thickness. Global RV systolic function is has normal systolic function. The tricuspid regurgitant velocity is 2.47 m/s, and with an assumed right atrial pressure  of 3 mmHg, the estimated right ventricular systolic pressure is normal at 27.4 mmHg. Left Atrium: Left atrial size was normal in size. Right  Atrium: Right atrial size was normal in size Pericardium: There is no evidence of pericardial effusion. Mitral Valve: The mitral valve is normal in structure. Trivial mitral valve regurgitation. No evidence of mitral valve stenosis by observation. Tricuspid Valve: The tricuspid valve is normal in structure. Tricuspid valve regurgitation is mild. Aortic Valve: The aortic valve is tricuspid. . There is moderate thickening and moderate calcification of the aortic valve. Aortic valve regurgitation is not visualized. The aortic valve is structurally normal, with no evidence of sclerosis or stenosis. There is moderate thickening of the aortic valve. There is moderate calcification of the aortic valve. Pulmonic Valve: The pulmonic valve was normal in structure. Pulmonic valve regurgitation is not visualized. Pulmonic regurgitation is not visualized. Aorta: The aortic root, ascending aorta and aortic arch are all structurally normal, with no evidence of dilitation or obstruction. Venous: The inferior vena cava is normal in size with greater than 50% respiratory variability, suggesting right atrial pressure of 3 mmHg. IAS/Shunts: No atrial level shunt detected by color flow Doppler. There is no evidence of a patent foramen ovale. No ventricular septal defect is seen or detected. There is no evidence of an atrial septal defect.  LEFT VENTRICLE  PLAX 2D LVIDd:         4.20 cm  Diastology LVIDs:         3.00 cm  LV e' lateral: 13.40 cm/s LV PW:         1.20 cm  LV e' medial:  6.20 cm/s LV IVS:        1.20 cm LVOT diam:     1.90 cm LV SV:         44 ml LV SV Index:   20.45 LVOT Area:     2.84 cm  RIGHT VENTRICLE RV S prime:     13.30 cm/s TAPSE (M-mode): 2.3 cm LEFT ATRIUM             Index       RIGHT ATRIUM           Index LA diam:        3.40 cm 1.61 cm/m  RA Area:     12.10 cm LA Vol (A2C):   56.0 ml 26.57 ml/m RA Volume:   28.80 ml  13.66 ml/m LA Vol (A4C):   33.7 ml 15.99 ml/m LA Biplane Vol: 44.3 ml 21.02 ml/m   AORTIC VALVE LVOT Vmax:   54.80 cm/s LVOT Vmean:  41.400 cm/s LVOT VTI:    0.150 m  AORTA Ao Root diam: 2.80 cm TRICUSPID VALVE TR Peak grad:   24.4 mmHg TR Vmax:        247.00 cm/s  SHUNTS Systemic VTI:  0.15 m Systemic Diam: 1.90 cm  Armanda Magic MD Electronically signed by Armanda Magic MD Signature Date/Time: 01/29/2019/4:31:22 AM    Final (Updated)    CT HEAD CODE STROKE WO CONTRAST  Result Date: 01/28/2019 CLINICAL DATA:  Code stroke. Stroke. Slurred speech left facial droop EXAM: CT HEAD WITHOUT CONTRAST TECHNIQUE: Contiguous axial images were obtained from the base of the skull through the vertex without intravenous contrast. COMPARISON:  CT head 12/11/2017 FINDINGS: Brain: Moderate atrophy unchanged. Negative for hydrocephalus. Negative for acute cortical infarct, hemorrhage, mass. Hypodensity in the central lower pons could represent artifact versus acute infarct. Vascular: Negative for hyperdense vessel Skull: Negative Sinuses/Orbits: Chronic opacification left maxillary sinus with bony thickening unchanged. Mucosal edema left frontal and ethmoid sinus. Negative orbit. Other: None ASPECTS ([place]berta Stroke Program Early CT Score) - Ganglionic level infarction (caudate, lentiform nuclei, internal capsule, insula, M1-M3 cortex): 7 - Supraganglionic infarction (M4-M6 cortex): 3 Total score (0-10 with 10 being normal): 10 IMPRESSION: 1. No acute cortical infarct or hemorrhage. 2. Hypodensity in the pons could be artifact. 3. ASPECTS is 10 4. These results were called by telephone at the time of interpretation on 01/28/2019 at 9:36 pm to provider [facility] , who verbally acknowledged these results. Electronically Signed   By: Marlan Palau M.D.   On: 01/28/2019 21:37   Cerebral Angiogram S/P bilateral common carotid and RT Vert arteriogram followed by complete revascularization of occluded extracranial and intracranial RT ICA with x 2 passes with 40 mm solitaireX retriever device and penumbra  aspiration achieving a TICI 3  Rt MCA revascularization.   PHYSICAL EXAM  Temp:  [96.3 F (35.7 C)-98.1 F (36.7 C)] 98.1 F (36.7 C) (12/17 0800) Pulse Rate:  [42-134] 71 (12/17 1000) Resp:  [14-30] 24 (12/17 1000) BP: (85-142)/(49-130) 107/94 (12/17 1000) SpO2:  [92 %-100 %] 97 % (12/17 1000) Arterial Line BP: (78-132)/(46-67) 132/67 (12/17 1000) Weight:  [88.5 kg] 88.5 kg (12/16 2155)  General - Well nourished, well developed, in no  apparent distress.  Ophthalmologic - fundi not visualized due to noncooperation.  Cardiovascular - Regular rhythm and rate, not in afib.  Neuro - awake alert, fluent language, however, intermittently confused and incoherent. He is orientated to self and month and people, but not orientated to place, year and age. Able to name without difficulty, partially repeating sentences. Able to follow simple commands. No gaze deviation, no hemianopia. However, more right side preference and mild left sided neglect. Tracking on both sides, PERRL. Left facial droop. Tongue midline. Left UE 2+/5. LLE proximal 2+/5, distal 4+/5 with foot DF and PF. Sensation symmtrical subjectively. FTN intact on the right. Gait not tested.  ASSESSMENT/PLAN Mr. Chijioke Lasser is a 85 y.o. male with history of HLD, HTN, DM presenting following a fall with L sided weakness face, arm and leg.   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 pontiue hypodensity. ASPECTS 10.     CTA head & neck R ICA occlusion. Decreased R MCA flow w/ good cross-filling.  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   Therapy recommendations:  pending   Disposition:  pending   Atrial Fibrillation w/ RVR, new onset  Home anticoagulation:  none   CHA2DS2-VASc Score = at least 6, ?2 oral anticoagulation recommended  Age in Years:  ?40   +2    Sex:  Male   0    Hypertension History:  yes   +1     Diabetes Mellitus:  yes   +1  Congestive Heart Failure History:  0  Vascular Disease History:  yes   +1     Stroke/TIA/Thromboembolism History:  yes   +2 . Placed on IV heparin also given LV clot . 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 Recent cardiac event w/ continued troponin elevation  No BB d/t low BP  BNP pending   Cardiology Dr. Tamala Julian onboard   Hypotension Hx Hypertension  Home meds:  atenolol 25 daily, lisinopril 5  Low BP felt to be related to AF in setting of severe LV dysfunction  BP goal 120-140 x 24h following IR  Changed phenlyephrine to levophed  Stopped IVF d/t severe LV dysfunction . Long-term BP goal normotensive  Hyperlipidemia  Home meds:  pravachol 40  LDL NTC (TG 244 and HDL < 10), goal < 70  Direct LDL pending  Statin held d/t elevated LFTs  Continue statin once LFTs inproved  Diabetes type II Uncontrolled  Home meds:  Metformin 500  bid  HgbA1c 8.8, goal < 7.0  CBGs  SSI  Close PCP follow up  UGIB - ? Stress ulcer  Coffee ground color emesis   On NG negative wall suction  PPI IV Q12h  NPO for now  May consider  diet if stable tomorrow  Other Stroke Risk Factors  Advanced age  Other Active Problems  Mild cognitive decline  Physical deconditioning over past 2 weeks going from independent walking to using a walker. Had ER eval 2 wks ago for fatigue and decreased appetite.   AKI 2.32->2.2->1.94  Leukocytosis 15.0-17.4  Acute blood loss anemia 12.9-13.6-11.8   Mild transaminitis AST 67-> 68  ALT 49->50 ALP 133->146 (12/17)   Hospital day # 1  This patient is critically ill due to stroke, ICA occlusion s/p IR, MI, LV thrombus, afib RVR and at significant risk of neurological worsening, death form heart failure, cardiac shock, recurrent stroke, hemorrhagic conversion, seizure. This patient's care requires constant monitoring of vital signs, hemodynamics, respiratory and cardiac monitoring, review of multiple databases, neurological assessment, discussion with family, other specialists and medical decision making of high complexity. I spent 45 minutes of neurocritical care time in the care of this patient. I have discussed with Dr. Katrinka Blazing from cardiology. I had long discussion with daughter at bedside, updated pt current condition, treatment plan and potential prognosis, and answered all the questions. She expressed understanding and appreciation.   Marvel Plan, MD PhD Stroke Neurology 01/29/2019 11:33 AM    To contact Stroke Continuity provider, please refer to [email or web address]. After hours, contact General Neurology

## 2019-01-29 NOTE — Evaluation (Signed)
Clinical/Bedside Swallow Evaluation Patient Details  Name: Adam Leon MRN: [id number] Date of Birth: 1931-02-02  Today's Date: 01/29/2019 Time: SLP Start Time (ACUTE ONLY): 4496 SLP Stop Time (ACUTE ONLY): 0947 SLP Time Calculation (min) (ACUTE ONLY): 8 min  Past Medical History:  Past Medical History:  Diagnosis Date  . Hypercholesteremia   . Syncope   . Syncope   . Ventricular hypertrophy    Past Surgical History: History reviewed. No pertinent surgical history. HPI:  Byran Leon is a 85 y.o. male who has a PMH including but not limited to syncope, HLD.  He presented to [facility] ED 12/16 with left hemiplegia.  CTA showed R ICA occlusion.  He was subsequently taken to IR for arteriogram with complete revascularization of occluded extracranial and intracranial R ICA. CXR 12/17: interstitial edema.  CT 12/16: R MCA infarct in deep white matter   Assessment / Plan / Recommendation Clinical Impression  Pt presents with increased risk for aspiration in setting of recent R MCA CVA.  Pt has NG placed for suction at present with recent episode of vomiting.  During OME, when asked to complete volitional swallow, laryngeal bobbing was noted followed by gagging.  Only thin liquid was given during this assessment.  SLP provided oral care prior to administration of PO trials.  Dorsal surface of tongue was dry and brown to near black in color, which may be related to N/V, but did not come off with oral swabbing.  Please continue regular, thorough oral care.  With thin liquid by cup and spoon multiple there was oral holding prior to the swallow.  Pt required multiple swallows per bolus.  Pt reported globus sensation and wet gurgling was noted after the swallow with frequent belching as well.  Suspect GI issues/esophageal dysmotility are impacting swallow at this time given presence of NGT for suction with continued output.  Cannot rule out pharyngeal dysphagia with clinical assessment.  Pt is not  appropriate for further evaluation at this time.  Recommmend pt remain NPO at present.  SLP will follow to reassess as GI symptoms resolve. SLP Visit Diagnosis: Dysphagia, unspecified (R13.10)    Aspiration Risk  Moderate aspiration risk    Diet Recommendation NPO   Medication Administration: Via alternative means    Other  Recommendations Recommended Consults: Consider GI evaluation Oral Care Recommendations: Oral care QID   Follow up Recommendations (Continue ST at next level of care)      Frequency and Duration min 2x/week  2 weeks       Prognosis Prognosis for Safe Diet Advancement: Good      Swallow Study   General Date of Onset: 01/28/19 HPI: Adam Leon is a 85 y.o. male who has a PMH including but not limited to syncope, HLD.  He presented to [facility] ED 12/16 with left hemiplegia.  CTA showed R ICA occlusion.  He was subsequently taken to IR for arteriogram with complete revascularization of occluded extracranial and intracranial R ICA. CXR 12/17: interstitial edema.  CT 12/16: R MCA infarct in deep white matter Type of Study: Bedside Swallow Evaluation Previous Swallow Assessment: none Diet Prior to this Study: NPO Temperature Spikes Noted: No Respiratory Status: Nasal cannula History of Recent Intubation: No Behavior/Cognition: Alert;Cooperative;Confused;Requires cueing Oral Cavity Assessment: Dry;Dried secretions(dorsal surface of tongue is dark brown) Oral Care Completed by SLP: Yes Oral Cavity - Dentition: Poor condition;Adequate natural dentition Vision: Functional for self-feeding Self-Feeding Abilities: Needs assist Patient Positioning: Upright in bed Baseline Vocal Quality: Normal Volitional Cough: Cognitively unable  to elicit Volitional Swallow: Unable to elicit(resulted in gagging/near vomiting episode)    Oral/Motor/Sensory Function Overall Oral Motor/Sensory Function: Mild impairment Facial ROM: Reduced left Facial Strength: Reduced left Lingual  ROM: Within Functional Limits Lingual Symmetry: Abnormal symmetry left Lingual Strength: Reduced Mandible: Within Functional Limits   Ice Chips Ice chips: Not tested   Thin Liquid Thin Liquid: Impaired Presentation: Cup;Spoon Oral Phase Functional Implications: Oral holding Pharyngeal  Phase Impairments: Suspected delayed Swallow;Multiple swallows;Wet Vocal Quality    Nectar Thick Nectar Thick Liquid: Not tested   Honey Thick Honey Thick Liquid: Not tested   Puree Puree: Not tested   Solid     Solid: Not tested      Celedonio Savage, MA, [place] Office: [phone]  01/29/2019,10:01 AM

## 2019-01-29 NOTE — Progress Notes (Signed)
Pt transported with CRNA to 4N room 421 w/o incident report given to 4N RN and PACU RN bedside assessed groin and pulses all RNs in agreement.

## 2019-01-29 NOTE — Progress Notes (Signed)
Peripherally Inserted Central Catheter/Midline Placement  The IV Nurse has discussed with the patient and/or persons authorized to consent for the patient, the purpose of this procedure and the potential benefits and risks involved with this procedure.  The benefits include less needle sticks, lab draws from the catheter, and the patient may be discharged home with the catheter. Risks include, but not limited to, infection, bleeding, blood clot (thrombus formation), and puncture of an artery; nerve damage and irregular heartbeat and possibility to perform a PICC exchange if needed/ordered by physician.  Alternatives to this procedure were also discussed.  Bard Power PICC patient education guide, fact sheet on infection prevention and patient information card has been provided to patient /or left at bedside.    PICC/Midline Placement Documentation  PICC Triple Lumen 01/29/19 PICC Right Brachial 35 cm 0 cm (Active)  Indication for Insertion or Continuance of Line Vasoactive infusions 01/29/19 1656  Exposed Catheter (cm) 0 cm 01/29/19 1656  Site Assessment Clean;Dry;Intact 01/29/19 1656  Lumen #1 Status Flushed;Blood return noted;Saline locked 01/29/19 1656  Lumen #2 Status Flushed;Blood return noted;Saline locked 01/29/19 1656  Lumen #3 Status Flushed;Blood return noted;Saline locked 01/29/19 1656  Dressing Type Transparent;Securing device 01/29/19 1656  Dressing Status Clean;Dry;Intact;Antimicrobial disc in place 01/29/19 1656  Dressing Change Due 02/05/19 01/29/19 1656       Frances Maywood 01/29/2019, 5:01 PM

## 2019-01-29 NOTE — Evaluation (Signed)
Physical Therapy Evaluation Patient Details Name: Adam Leon MRN: [id number] DOB: Jun 25, 1930 Today's Date: 01/29/2019   History of Present Illness  [name] is a 85 y.o. male with history of HLD, HTN, DM presenting following a fall with L sided weakness face, arm and leg. R MCA infarct due to L ICA occlusion s/p IR w TICI3 revascularization, likely embolic d/t new AF w/ RVR and LV thrombus in the setting of undiagnosed recent MI  Clinical Impression  Pt admitted with/for left sided weakness.  Imaging showed R MCA involvement, pt s/p revascularization.  Pt still needing min to mod assist, mostly due to impulsivity, distractibility with some weakness..  Pt currently limited functionally due to the problems listed. ( See problems list.)   Pt will benefit from PT to maximize function and safety in order to get ready for next venue listed below.     Follow Up Recommendations CIR    Equipment Recommendations  Other (comment)(TBA)    Recommendations for Other Services       Precautions / Restrictions Precautions Precautions: Fall Precaution Comments: had NG tube Restrictions Weight Bearing Restrictions: No      Mobility  Bed Mobility Overal bed mobility: Needs Assistance Bed Mobility: Supine to Sit;Rolling Rolling: Mod assist   Supine to sit: Mod assist;+2 for physical assistance;+2 for safety/equipment     General bed mobility comments: ModA +2 for initiating movements; once EOB, pt able to tolerate  Transfers Overall transfer level: Needs assistance Equipment used: None Transfers: Sit to/from Stand Sit to Stand: Min assist;+2 physical assistance;+2 safety/equipment         General transfer comment: assist for stability and to reduce impulsivity  Ambulation/Gait Ambulation/Gait assistance: Min assist;+2 safety/equipment           General Gait Details: marched in place with pt needing minimal stability assist for w/shift, mostly with w/shift to the  left while pt is lifting the right.  Stairs            Wheelchair Mobility    Modified Rankin (Stroke Patients Only) Modified Rankin (Stroke Patients Only) Pre-Morbid Rankin Score: No symptoms Modified Rankin: Moderately severe disability     Balance Overall balance assessment: Needs assistance   Sitting balance-Leahy Scale: Fair       Standing balance-Leahy Scale: Poor Standing balance comment: requiring hand held assist for support                             Pertinent Vitals/Pain Pain Assessment: Faces Faces Pain Scale: No hurt    Home Living Family/patient expects to be discharged to:: Private residence Living Arrangements: Spouse/significant other Available Help at Discharge: Family;Available PRN/intermittently Type of Home: House Home Access: Stairs to enter   [employer] of Steps: 2 flights Home Layout: Two level;Bed/bath upstairs Home Equipment: Kasandra Knudsen - single point Additional Comments: children are nearby; info from daughter in room    Prior Function Level of Independence: Needs assistance   Gait / Transfers Assistance Needed: last 2-3 weeks, pt was getting worse with mobility and cognition  ADL's / Homemaking Assistance Needed: Pt driving, pt's family reports independence with ADL  Comments: Info from daughter as pt talking out of head     Hand Dominance   Dominant Hand: Right    Extremity/Trunk Assessment   Upper Extremity Assessment Upper Extremity Assessment: Generalized weakness;RUE deficits/detail;LUE deficits/detail RUE Deficits / Details: 3/5 MM grade shoulder through digits; mitt on hand LUE Deficits /  Details: 3-/5 MM grade shoulder through digits    Lower Extremity Assessment Lower Extremity Assessment: Generalized weakness(L LE mildly weaker than R LE)    Cervical / Trunk Assessment Cervical / Trunk Assessment: Normal  Communication   Communication: No difficulties  Cognition Arousal/Alertness:  Awake/alert Behavior During Therapy: Restless;Impulsive(distractible) Overall Cognitive Status: Impaired/Different from baseline Area of Impairment: Orientation;Attention;Memory;Following commands;Safety/judgement;Awareness;Problem solving                 Orientation Level: Disoriented to;Place;Time;Situation Current Attention Level: Sustained Memory: Decreased short-term memory Following Commands: Follows one step commands inconsistently Safety/Judgement: Decreased awareness of safety;Decreased awareness of deficits Awareness: Intellectual Problem Solving: Slow processing;Decreased initiation;Difficulty sequencing;Requires verbal cues;Requires tactile cues General Comments: Pt talking about "the guys" and living in [place] when asked where he was right now. Daughter reports possible dementia, but not diagnosed for the past 6 months regarding memory.      General Comments General comments (skin integrity, edema, etc.): Daughter in room prior to moving pt. VSS. O2 >95% on RA; 122/67 BP post activity, HR 76 BPM    Exercises     Assessment/Plan    PT Assessment Patient needs continued PT services  PT Problem List Decreased strength;Decreased activity tolerance;Decreased balance;Decreased mobility;Decreased coordination;Decreased knowledge of use of DME;Decreased safety awareness       PT Treatment Interventions Gait training;Functional mobility training;Therapeutic activities;Balance training;Neuromuscular re-education;Patient/family education;Stair training;DME instruction    PT Goals (Current goals can be found in the Care Plan section)  Acute Rehab PT Goals Patient Stated Goal: to go home safely PT Goal Formulation: With patient/family Time For Goal Achievement: 02/12/19 Potential to Achieve Goals: Good    Frequency Min 3X/week   Barriers to discharge        Co-evaluation PT/OT/SLP Co-Evaluation/Treatment: Yes Reason for Co-Treatment: Complexity of the patient's  impairments (multi-system involvement);Necessary to address cognition/behavior during functional activity PT goals addressed during session: Mobility/safety with mobility OT goals addressed during session: ADL's and self-care       AM-PAC PT "6 Clicks" Mobility  Outcome Measure Help needed turning from your back to your side while in a flat bed without using bedrails?: A Lot Help needed moving from lying on your back to sitting on the side of a flat bed without using bedrails?: A Lot Help needed moving to and from a bed to a chair (including a wheelchair)?: A Little Help needed standing up from a chair using your arms (e.g., wheelchair or bedside chair)?: A Little Help needed to walk in hospital room?: A Little Help needed climbing 3-5 steps with a railing? : A Little 6 Click Score: 16    End of Session   Activity Tolerance: Patient tolerated treatment well Patient left: in bed;with call bell/phone within reach;with bed alarm set Nurse Communication: Mobility status PT Visit Diagnosis: Unsteadiness on feet (R26.81);Other abnormalities of gait and mobility (R26.89);Other symptoms and signs involving the nervous system (R29.898)    Time: 1500-1531 PT Time Calculation (min) (ACUTE ONLY): 31 min   Charges:   PT Evaluation $PT Eval Moderate Complexity: 1 Mod          01/29/2019  Jacinto Halim., PT Acute Rehabilitation Services [phone]  (pager) [phone]  (office)  Eliseo Gum Xaine Sansom 01/29/2019, 5:18 PM

## 2019-01-29 NOTE — Anesthesia Postprocedure Evaluation (Signed)
Anesthesia Post Note  Patient: Adam Leon  Procedure(s) Performed: IR WITH ANESTHESIA (N/A )     Patient location during evaluation: PACU Anesthesia Type: General Level of consciousness: awake and alert Pain management: pain level controlled Vital Signs Assessment: post-procedure vital signs reviewed and stable Respiratory status: spontaneous breathing, nonlabored ventilation, respiratory function stable and patient connected to nasal cannula oxygen Cardiovascular status: blood pressure returned to baseline and stable Postop Assessment: no apparent nausea or vomiting Anesthetic complications: no    Last Vitals:  Vitals:   01/29/19 0700 01/29/19 0715  BP: 109/60 100/62  Pulse: 81 81  Resp: (!) 28 (!) 21  Temp:    SpO2: 94% 97%    Last Pain:  Vitals:   01/29/19 0400  TempSrc: Axillary  PainSc: 0-No pain                 Kandis Henry DAVID

## 2019-01-29 NOTE — Progress Notes (Signed)
Report received from [place] in pt room [phone].  Roselyn Reef Primary RN at bedside as well.  This RN is recording vitals and Rennie Natter will document NIH scale.

## 2019-01-29 NOTE — Progress Notes (Signed)
Care of the pt was turned over to [place], [place].

## 2019-01-29 NOTE — Progress Notes (Signed)
ANTICOAGULATION CONSULT NOTE - Initial Consult  Pharmacy Consult for Heparin Indication: ACS s/p carotid embolectomy and LV thrombus  No Known Allergies  Patient Measurements: Height: 6' (182.9 cm) Weight: 195 lb (88.5 kg) IBW/kg (Calculated) : 77.6  Vital Signs: Temp: 97.7 F (36.5 C) (12/17 0400) Temp Source: Axillary (12/17 0400) BP: 115/68 (12/17 0400) Pulse Rate: 79 (12/17 0400)  Labs: Recent Labs    01/28/19 2120 01/28/19 2126 01/28/19 2313 01/29/19 0240  HGB 12.9* 13.6  --  11.8*  HCT 38.8* 40.0  --  35.8*  PLT 359  --   --  406*  CREATININE 2.32* 2.20*  --  1.94*  TROPONINIHS 15,519*  --  15,715*  --     Estimated Creatinine Clearance: 30 mL/min (A) (by C-G formula based on SCr of 1.94 mg/dL (H)).   Medical History: Past Medical History:  Diagnosis Date  . Hypercholesteremia   . Syncope   . Syncope   . Ventricular hypertrophy     Medications:  Medications Prior to Admission  Medication Sig Dispense Refill Last Dose  . aspirin 81 MG tablet Take 81 mg by mouth daily.     Marland Kitchen atenolol (TENORMIN) 25 MG tablet Take 25 mg by mouth daily.     Marland Kitchen lisinopril (PRINIVIL,ZESTRIL) 5 MG tablet Take 5 mg by mouth daily.     . metFORMIN (GLUCOPHAGE-XR) 500 MG 24 hr tablet Take 500 mg by mouth 2 (two) times daily.     . pravastatin (PRAVACHOL) 40 MG tablet Take 40 mg by mouth daily.       Assessment: 85 y.o. male with EKG changes, elevated cardiac markers and LV thrombus s/p CVA and carotid thrombectomy for heparin  Goal of Therapy:  Heparin level 0.3-0.5 units/mL Monitor platelets by anticoagulation protocol: Yes   Plan:  Start heparin 1000 units/hr Check heparin level in 8 hours.   Anay Rathe, Bronson Curb 01/29/2019,4:56 AM

## 2019-01-29 NOTE — Transfer of Care (Signed)
Immediate Anesthesia Transfer of Care Note  Patient: Ova Meegan  Procedure(s) Performed: IR WITH ANESTHESIA (N/A )  Patient Location: ICU  Anesthesia Type:General  Level of Consciousness: awake, patient cooperative and Patient remains intubated per anesthesia plan  Airway & Oxygen Therapy: Patient Spontanous Breathing and Patient connected to face mask oxygen  Post-op Assessment: Report given to RN, Post -op Vital signs reviewed and stable and Patient moving all extremities X 4  Post vital signs: Reviewed and stable  Last Vitals:  Vitals Value Taken Time  BP 142/130 01/29/19 0115  Temp    Pulse 110 01/29/19 0115  Resp 22 01/29/19 0115  SpO2 100 % 01/29/19 0115    Last Pain:  Vitals:   01/28/19 2109  TempSrc: Temporal         Complications: No apparent anesthesia complications

## 2019-01-29 NOTE — Progress Notes (Signed)
Right carotid artery duplex completed.  01/29/2019 3:09 PM Maudry Mayhew, MHA, RVT, RDCS, RDMS

## 2019-01-29 NOTE — Progress Notes (Signed)
Initial Nutrition Assessment  DOCUMENTATION CODES:   Not applicable  INTERVENTION:   Monitor for diet advancement and supplement as appropriate.    NUTRITION DIAGNOSIS:   Inadequate oral intake related to inability to eat as evidenced by NPO status.  GOAL:   Patient will meet greater than or equal to 90% of their needs  MONITOR:   Diet advancement, I & O's  REASON FOR ASSESSMENT:   Malnutrition Screening Tool    ASSESSMENT:   Pt with PMH of HLD admitted with R MCA infarct secondary to R ICA occlusion s/p IR for revascularization.   Pt with hypotension post op and started on a pressor.  Pt with NG to suction for coffee ground emesis.  Pt unable to provide hx.   Medications reviewed and include: novolog Levo @ 14 Vasopressin @ .03 units Labs reviewed    NUTRITION - FOCUSED PHYSICAL EXAM:  Deferred  Diet Order:   Diet Order            Diet NPO time specified  Diet effective now              EDUCATION NEEDS:   No education needs have been identified at this time  Skin:  Skin Assessment: Reviewed RN Assessment  Last BM:  unknown  Height:   Ht Readings from Last 1 Encounters:  01/28/19 6' (1.829 m)    Weight:   Wt Readings from Last 1 Encounters:  01/28/19 88.5 kg    Ideal Body Weight:     BMI:  Body mass index is 26.45 kg/m.  Estimated Nutritional Needs:   Kcal:  2000-2200  Protein:  100-115 grams  Fluid:  >2 L/day  Maylon Peppers RD, LDN, CNSC [phone] Pager [phone] After Hours Pager

## 2019-01-29 NOTE — Consult Note (Signed)
NAME:  Adam Leon, MRN:  [id number], DOB:  07-20-1930, LOS: 1 ADMISSION DATE:  01/28/2019, CONSULTATION DATE:  01/29/19 REFERRING MD:  Rory Percy  CHIEF COMPLAINT:  R ICA occlusion   Brief History   Adam Leon is a 85 y.o. male who was admitted 12/16 with R ICA occlusion.  He was taken to IR for revascularization.  History of present illness   Adam Leon is a 85 y.o. male who has a PMH including but not limited to syncope, HLD.  He presented to [facility] ED 12/16 with left hemiplegia.  CTA showed R ICA occlusion.  He was subsequently taken to IR for arteriogram with complete revascularization of occluded extracranial and intracranial R ICA.  Post IR, he had hypotension.  Goal SBP was 120 - 140; therefore, he was started on neosynephrine and PCCM was consulted for assistance.  Of note, he had trop of > [id number].  Initial EKG concerning for anterior infarct.  Cards consulted by EDP who felt might represent subacute insult.  Recommended supportive care and echo following carotid revascularization.  Formal consult pending.  Past Medical History  has Acute ischemic stroke [facility]) and Internal carotid artery occlusion, right on their problem list.  [facility] Events   12/16 > admitted, had IR revascularization.  Consults:  PCCM, cardiology.  Procedures:  ETT 12/16 > 12/16. Left radial art line 12/16 >   Significant Diagnostic Tests:  CT head 12/16 > no acute infarct. CTA head / neck 12/16 > 9ml core infarct on right.  Occlusion of right ICA. MRI brain 12/17 >  Echo 12/17 >   Micro Data:  SARS CoV2 12/16 > neg. Flu 12/16 > neg.  Antimicrobials:  None.   Interim history/subjective:  No complaints.  Objective:  Blood pressure (!) 93/53, pulse (!) 106, temperature (!) 96.7 F (35.9 C), temperature source Temporal, resp. rate (!) 22, height 6' (1.829 m), weight 88.5 kg, SpO2 96 %.        Intake/Output Summary (Last 24 hours) at 01/29/2019 0202 Last data filed at  01/29/2019 0121 Gross per 24 hour  Intake 1900 ml  Output 20 ml  Net 1880 ml   Filed Weights   01/28/19 2155  Weight: 88.5 kg    Examination: General: Adult male, in NAD. Neuro: A&O x 3, no some left hemiplegia. HEENT: [place]/AT. Sclerae anicteric.  EOMI. Cardiovascular: RRR, no M/R/G.  Lungs: Respirations even and unlabored.  CTA bilaterally, No W/R/R.  Abdomen: BS x 4, soft, NT/ND.  Musculoskeletal: No gross deformities, no edema.  Skin: Intact, warm, no rashes.  Assessment & Plan:   R ICA occlusion - s/p IR revascularization. - Post op care per IR. - Workup / management per neuro.  Hypotension - ? Sedation related post intubation.  Was normotensive prior. - Continue neo for goal SBP 120 - 140.  ? STEMI - ST elevation V3 - V6 with TWI with AVL, V1, V2; however, no reciprocal changes noted. - Cards consulted, recommending stat echo. - Formal consult pending.  AKI - likely hypovolemic / pre-renal. Hyponatremia - hypovolemic. - LR @ 100.  Hyperglycemia. - SSI.  Hypocalcemia. - 1g Ca gluconate.  Mild transaminitis. - Trend LFT's.   Best Practice:  Diet: NPO. Pain/Anxiety/Delirium protocol (if indicated): N/A. VAP protocol (if indicated): N/A. DVT prophylaxis: SCD's. GI prophylaxis: N/A. Glucose control: SSI. Mobility: Bedrest. Code Status: Full. Family Communication: None available. Disposition: ICU.  Labs   CBC: Recent Labs  Lab 01/28/19 2120 01/28/19 2126  WBC 15.0*  --  NEUTROABS 12.1*  --   HGB 12.9* 13.6  HCT 38.8* 40.0  MCV 83.3  --   PLT 359  --    Basic Metabolic Panel: Recent Labs  Lab 01/28/19 2120 01/28/19 2126  NA 133* 134*  K 4.6 4.6  CL 100 98  CO2 18*  --   GLUCOSE 225* 224*  BUN 54* 68*  CREATININE 2.32* 2.20*  CALCIUM 8.1*  --    GFR: Estimated Creatinine Clearance: 26.5 mL/min (A) (by C-G formula based on SCr of 2.2 mg/dL (H)). Recent Labs  Lab 01/28/19 2120  WBC 15.0*   Liver Function Tests: Recent Labs  Lab  01/28/19 2120  AST 67*  ALT 49*  ALKPHOS 133*  BILITOT 1.6*  PROT 6.0*  ALBUMIN 2.3*   No results for input(s): LIPASE, AMYLASE in the last 168 hours. No results for input(s): AMMONIA in the last 168 hours. ABG    Component Value Date/Time   TCO2 23 01/28/2019 2126    Coagulation Profile: No results for input(s): INR, PROTIME in the last 168 hours. Cardiac Enzymes: No results for input(s): CKTOTAL, CKMB, CKMBINDEX, TROPONINI in the last 168 hours. HbA1C: No results found for: HGBA1C CBG: No results for input(s): GLUCAP in the last 168 hours.  Review of Systems:   All negative; except for those that are bolded, which indicate positives.  Constitutional: weight loss, weight gain, night sweats, fevers, chills, fatigue, weakness.  HEENT: headaches, sore throat, sneezing, nasal congestion, post nasal drip, difficulty swallowing, tooth/dental problems, visual complaints, visual changes, ear aches. Neuro: difficulty with speech, weakness, numbness, ataxia. CV:  chest pain, orthopnea, PND, swelling in lower extremities, dizziness, palpitations, syncope.  Resp: cough, hemoptysis, dyspnea, wheezing. GI: heartburn, indigestion, abdominal pain, nausea, vomiting, diarrhea, constipation, change in bowel habits, loss of appetite, hematemesis, melena, hematochezia.  GU: dysuria, change in color of urine, urgency or frequency, flank pain, hematuria. MSK: joint pain or swelling, decreased range of motion. Psych: change in mood or affect, depression, anxiety, suicidal ideations, homicidal ideations. Skin: rash, itching, bruising.   Past medical history  He,  has a past medical history of Hypercholesteremia, Syncope, Syncope, and Ventricular hypertrophy.   Surgical History   History reviewed. No pertinent surgical history.   Social History   reports that he has never smoked. He has never used smokeless tobacco.   Family history   His family history is not on file.   Allergies No  Known Allergies   Home meds  Prior to Admission medications   Medication Sig Start Date End Date Taking? Authorizing Provider  aspirin 81 MG tablet Take 81 mg by mouth daily.    [provider]  atenolol (TENORMIN) 25 MG tablet Take 25 mg by mouth daily. 09/17/17   [provider]  lisinopril (PRINIVIL,ZESTRIL) 5 MG tablet Take 5 mg by mouth daily. 09/17/17   [provider]  metFORMIN (GLUCOPHAGE-XR) 500 MG 24 hr tablet Take 500 mg by mouth 2 (two) times daily. 09/17/17   [provider]  pravastatin (PRAVACHOL) 40 MG tablet Take 40 mg by mouth daily. 09/17/17   [provider]    Critical care time: 45 min.    Rutherford Guys, [place] [facility] 01/29/2019, 2:02 AM

## 2019-01-29 NOTE — Progress Notes (Signed)
Patient ID: Adam Leon, male   DOB: 11-24-1930, 85 y.o.   MRN: [id number] INR. Post procedure CT brain shows no hemorrhage or mass effect. Rt groin hemostasis with 35F angioseal closure device.RT groin soft . Distal pulses dopplerable DPs and PTs bilaterally . Patient extubated. Responds  To simple commands appropriately. No sig movement in LT arm or leg. S.Keenon Leitzel MD

## 2019-01-29 NOTE — Plan of Care (Addendum)
Post endovascular thrombectomy.  Patient had a successful revascularization of the right internal carotid with TICI3 flow in the right MCA. Postprocedure, still plegic on the left. Postprocedure CT on the table negative for bleed. Aspirin started Patient systolic blood pressure continue to be in the 80s. Afib on monitor. Spoke with critical care for consultation.  Recommended phenylephrine infusion to be started to maintain goal blood pressures between 120-140. Informed cardiology that the patient is now out of IR for 2D echocardiogram.  Consults requested: -PCCM -Cardiology Appreciate assistance.  We will continue to follow  -- Amie Portland, MD [facility] Pager: [phone] If 7pm to 7am, please call on call as listed on AMION.

## 2019-01-29 NOTE — Progress Notes (Addendum)
  Echocardiogram 2D Echocardiogram has been performed. Probable clot in apex of left ventricle and decreased EF.  Adam Leon 01/29/2019, 3:16 AM

## 2019-01-29 NOTE — Consult Note (Addendum)
Admit date: 01/28/2019 Referring Physician:  Milon Dikes, MD Primary Cardiologist  NONE (NEW) Reason for Consultation  EKG changes and elevated troponin  HPI: Adam Leon is a 85 y.o. male who is being seen today for the evaluation of abnormal EKG and elevated troponin at the request of Cathlean Cower, MD.  This is an 85yo male with a hx of HLD, HTN, DM.Apparently the patient was seen in the  ER with complaints of fatigue and poor eating for 3 days prior as well as sleeping a lot. The patient had no complaints at the time.  His workup was negative and he went home.  Today he presented to the ER with  altered mental status per the family report since Monday.  Has been noncompliant with meds.  He has not been eating well and has been sleeping a lot with fatigue. Apparently had a fall today at 1700 and upon EMS arrival had right sided gaze and garbled speech.  He was found to be plegic on the left with facial droop on the left and code stroke was activated.    According to  baseline, up till 2 weeks ago, he was walking without a walker, did bathe and feed himself and only had mild memory problems. Two weeks ago he had a ER evaluation for fatigue and decreased appetite, but most of the work up remained unremarkable and he was d/c'd home from ER. He did have low grade fever and leukopenia at the time. COVID test at the time was negative. No recent COVID exposures reported.  EKG was concerning for Anterior MI with ST elevated in V3-V6 and Dr. Katrinka Blazing with interventional Cards was called and felt that EKG changes related to acute CVA with occluded carotid artery and no need for intervention.  Neuro called me to notify that hstrop was elevated to 15,[phone].  Still likely related to the acute CVA with occluded carotid artery with demand ischemia in the setting of AKI.  He has been taken to IR for emergent b/l common carotid angiogram with complete revascularization of occluded extracranial right ICA.    Post  intervention the patient developed afib with RVR and hypotension and started on Phenyephrine gtt by CCM.  According to Neuro patient had a very long clot that was removed from carotid during procedure.  Currently resting in bed.  Now back in NSR with PACs.  He is very confused and cannot get an accurate history.  He tells me he has not had any CP.     PMH:   Past Medical History:  Diagnosis Date  . Hypercholesteremia   . Syncope   . Syncope   . Ventricular hypertrophy      PSH:  History reviewed. No pertinent surgical history.  Allergies:  Patient has no known allergies. Prior to Admit Meds:   Medications Prior to Admission  Medication Sig Dispense Refill Last Dose  . aspirin 81 MG tablet Take 81 mg by mouth daily.     Marland Kitchen atenolol (TENORMIN) 25 MG tablet Take 25 mg by mouth daily.     Marland Kitchen lisinopril (PRINIVIL,ZESTRIL) 5 MG tablet Take 5 mg by mouth daily.     . metFORMIN (GLUCOPHAGE-XR) 500 MG 24 hr tablet Take 500 mg by mouth 2 (two) times daily.     . pravastatin (PRAVACHOL) 40 MG tablet Take 40 mg by mouth daily.      Fam HX:   No family history on file. Social HX:    Social History  Socioeconomic History  . Marital status: Married    Spouse name: Not on file  . Number of children: Not on file  . Years of education: Not on file  . Highest education level: Not on file  Occupational History  . Not on file  Tobacco Use  . Smoking status: Never Smoker  . Smokeless tobacco: Never Used  Substance and Sexual Activity  . Alcohol use: Not on file  . Drug use: Not on file  . Sexual activity: Not on file  Other Topics Concern  . Not on file  Social History Narrative  . Not on file   Social Determinants of Health   Financial Resource Strain:   . Difficulty of Paying Living Expenses: Not on file  Food Insecurity:   . Worried About Programme researcher, broadcasting/film/videounning Out of Food in the Last Year: Not on file  . Ran Out of Food in the Last Year: Not on file  Transportation Needs:   . Lack of  Transportation (Medical): Not on file  . Lack of Transportation (Non-Medical): Not on file  Physical Activity:   . Days of Exercise per Week: Not on file  . Minutes of Exercise per Session: Not on file  Stress:   . Feeling of Stress : Not on file  Social Connections:   . Frequency of Communication with Friends and Family: Not on file  . Frequency of Social Gatherings with Friends and Family: Not on file  . Attends Religious Services: Not on file  . Active Member of Clubs or Organizations: Not on file  . Attends BankerClub or Organization Meetings: Not on file  . Marital Status: Not on file  Intimate Partner Violence:   . Fear of Current or Ex-Partner: Not on file  . Emotionally Abused: Not on file  . Physically Abused: Not on file  . Sexually Abused: Not on file     ROS:  All  ROS were addressed and are negative except what is stated in the HPI  Physical Exam: Blood pressure (!) 93/53, pulse (!) 106, temperature (!) 96.7 F (35.9 C), temperature source Temporal, resp. rate (!) 22, height 6' (1.829 m), weight 88.5 kg, SpO2 96 %.    General: Well developed, well nourished, in no acute distress Head: Eyes PERRLA, No xanthomas.   Normal cephalic and atramatic  Lungs:   Clear bilaterally to auscultation and percussion. Heart:   Irregularly irregular S1 S2 Pulses are 2+ & equal.            No carotid bruit. No JVD.  No abdominal bruits. No femoral bruits. Abdomen: Bowel sounds are positive, abdomen soft and non-tender without masses or                  Hernia's noted. Msk:  Back normal, normal gait. Normal strength and tone for age. Extremities:   No clubbing, cyanosis or edema.  DP +1 Neuro: Alert and oriented X 3. Psych:  Good affect, responds appropriately    Labs:   Lab Results  Component Value Date   WBC 15.0 (H) 01/28/2019   HGB 13.6 01/28/2019   HCT 40.0 01/28/2019   MCV 83.3 01/28/2019   PLT 359 01/28/2019    Recent Labs  Lab 01/28/19 2120 01/28/19 2126  NA 133*  134*  K 4.6 4.6  CL 100 98  CO2 18*  --   BUN 54* 68*  CREATININE 2.32* 2.20*  CALCIUM 8.1*  --   PROT 6.0*  --   BILITOT 1.6*  --  ALKPHOS 133*  --   ALT 49*  --   AST 67*  --   GLUCOSE 225* 224*   No results found for: PTT No results found for: INR, PROTIME Lab Results  Component Value Date   TROPONINI <0.03 12/11/2017    No results found for: CHOL No results found for: HDL No results found for: LDLCALC No results found for: TRIG No results found for: CHOLHDL No results found for: LDLDIRECT    Radiology:  CT Code Stroke CTA Head W/WO contrast  Result Date: 01/28/2019 CLINICAL DATA:  Stroke.  Slurred speech.  Left facial droop. EXAM: CT ANGIOGRAPHY HEAD AND NECK CT PERFUSION BRAIN TECHNIQUE: Multidetector CT imaging of the head and neck was performed using the standard protocol during bolus administration of intravenous contrast. Multiplanar CT image reconstructions and MIPs were obtained to evaluate the vascular anatomy. Carotid stenosis measurements (when applicable) are obtained utilizing NASCET criteria, using the distal internal carotid diameter as the denominator. Multiphase CT imaging of the brain was performed following IV bolus contrast injection. Subsequent parametric perfusion maps were calculated using RAPID software. CONTRAST:  OMNIPAQUE IOHEXOL 350 MG/ML SOLN COMPARISON:  CT head 01/28/2019 FINDINGS: CTA NECK FINDINGS Aortic arch: Proximal great vessels widely patent. Incomplete imaging of the aortic arch. Right carotid system: Right common carotid artery widely patent. 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 Right internal carotid artery remains occluded through the cavernous segment. Reconstitution of the supraclinoid internal carotid artery on the right. Left carotid system: Left carotid bifurcation widely patent without significant stenosis. Vertebral arteries: Both vertebral arteries are patent to the basilar without significant stenosis.  Skeleton: No acute skeletal abnormality.  Poor dentition. Other neck: Thyroid goiter.  No mass lesion. Upper chest: Mild pleural thickening along the major fissure on the right. Otherwise lungs clear. Review of the MIP images confirms the above findings CTA HEAD FINDINGS Anterior circulation: Right internal carotid artery is occluded through the cavernous segment with reconstitution of the supraclinoid segment. Both anterior and middle cerebral arteries are patent without significant stenosis or thrombus. Right anterior and middle cerebral arteries supplied through the anterior communicating artery. No significant posterior communicating artery identified on the right. Left cavernous carotid widely patent. Posterior circulation: Both vertebral arteries patent to the basilar. PICA patent. Basilar widely patent. AICA, superior cerebellar, posterior cerebral arteries patent bilaterally. Venous sinuses: Minimal venous contrast due to arterial phase scanning Anatomic variants: None Review of the MIP images confirms the above findings CT Brain Perfusion Findings: ASPECTS: 10 CBF (<30%) Volume: 13mL Perfusion (Tmax>6.0s) volume: Mismatch Volume: Infarction Location:Right MCA territory shows diffuse delayed perfusion. Core infarct in the right watershed territory in the deep white matter. IMPRESSION: 1. 13 mL core infarct in the watershed territory on the right. Large area of delayed perfusion right MCA territory due to occlusion of the right internal carotid artery. Right anterior middle cerebral arteries are patent and supplied through the anterior communicating artery. Collateral circulation is most likely the reason for delayed perfusion in the right MCA territory. 2. No other significant intracranial stenosis. 3. These results were called by telephone at the time of interpretation on 01/28/2019 at 9:54 pm to provider [facility] , who verbally acknowledged these results. Electronically Signed   By: Marlan Palau M.D.   On: 01/28/2019 21:56   CT Code Stroke CTA Neck W/WO contrast  Result Date: 01/28/2019 CLINICAL DATA:  Stroke.  Slurred speech.  Left facial droop. EXAM: CT ANGIOGRAPHY HEAD AND NECK CT PERFUSION  BRAIN TECHNIQUE: Multidetector CT imaging of the head and neck was performed using the standard protocol during bolus administration of intravenous contrast. Multiplanar CT image reconstructions and MIPs were obtained to evaluate the vascular anatomy. Carotid stenosis measurements (when applicable) are obtained utilizing NASCET criteria, using the distal internal carotid diameter as the denominator. Multiphase CT imaging of the brain was performed following IV bolus contrast injection. Subsequent parametric perfusion maps were calculated using RAPID software. CONTRAST:  OMNIPAQUE IOHEXOL 350 MG/ML SOLN COMPARISON:  CT head 01/28/2019 FINDINGS: CTA NECK FINDINGS Aortic arch: Proximal great vessels widely patent. Incomplete imaging of the aortic arch. Right carotid system: Right common carotid artery widely patent. 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 Right internal carotid artery remains occluded through the cavernous segment. Reconstitution of the supraclinoid internal carotid artery on the right. Left carotid system: Left carotid bifurcation widely patent without significant stenosis. Vertebral arteries: Both vertebral arteries are patent to the basilar without significant stenosis. Skeleton: No acute skeletal abnormality.  Poor dentition. Other neck: Thyroid goiter.  No mass lesion. Upper chest: Mild pleural thickening along the major fissure on the right. Otherwise lungs clear. Review of the MIP images confirms the above findings CTA HEAD FINDINGS Anterior circulation: Right internal carotid artery is occluded through the cavernous segment with reconstitution of the supraclinoid segment. Both anterior and middle cerebral arteries are patent without significant stenosis or  thrombus. Right anterior and middle cerebral arteries supplied through the anterior communicating artery. No significant posterior communicating artery identified on the right. Left cavernous carotid widely patent. Posterior circulation: Both vertebral arteries patent to the basilar. PICA patent. Basilar widely patent. AICA, superior cerebellar, posterior cerebral arteries patent bilaterally. Venous sinuses: Minimal venous contrast due to arterial phase scanning Anatomic variants: None Review of the MIP images confirms the above findings CT Brain Perfusion Findings: ASPECTS: 10 CBF (<30%) Volume: 13mL Perfusion (Tmax>6.0s) volume: Mismatch Volume: Infarction Location:Right MCA territory shows diffuse delayed perfusion. Core infarct in the right watershed territory in the deep white matter. IMPRESSION: 1. 13 mL core infarct in the watershed territory on the right. Large area of delayed perfusion right MCA territory due to occlusion of the right internal carotid artery. Right anterior middle cerebral arteries are patent and supplied through the anterior communicating artery. Collateral circulation is most likely the reason for delayed perfusion in the right MCA territory. 2. No other significant intracranial stenosis. 3. These results were called by telephone at the time of interpretation on 01/28/2019 at 9:54 pm to provider [facility] , who verbally acknowledged these results. Electronically Signed   By: Marlan Palau M.D.   On: 01/28/2019 21:56   CT C-SPINE NO CHARGE  Result Date: 01/28/2019 CLINICAL DATA:  Trauma.  Rule out spine fracture EXAM: CT CERVICAL SPINE WITHOUT CONTRAST TECHNIQUE: Multidetector CT imaging of the cervical spine was performed without intravenous contrast. Multiplanar CT image reconstructions were also generated. COMPARISON:  None. FINDINGS: Alignment: Normal Skull base and vertebrae: Negative for fracture Soft tissues and spinal canal: Goiter with enlargement of the right  lobe of the thyroid. No adenopathy in the neck. Disc levels: Mild disc degeneration and spurring C5-6 with foraminal narrowing bilaterally. Upper chest: Mild thickening of the major fissure on the right. Otherwise lung apices clear. Other: None IMPRESSION: Negative for cervical spine fracture. Electronically Signed   By: Marlan Palau M.D.   On: 01/28/2019 21:59   CT Code Stroke Cerebral Perfusion with contrast  Result Date: 01/28/2019 CLINICAL DATA:  Stroke.  Slurred speech.  Left facial droop. EXAM: CT ANGIOGRAPHY HEAD AND NECK CT PERFUSION BRAIN TECHNIQUE: Multidetector CT imaging of the head and neck was performed using the standard protocol during bolus administration of intravenous contrast. Multiplanar CT image reconstructions and MIPs were obtained to evaluate the vascular anatomy. Carotid stenosis measurements (when applicable) are obtained utilizing NASCET criteria, using the distal internal carotid diameter as the denominator. Multiphase CT imaging of the brain was performed following IV bolus contrast injection. Subsequent parametric perfusion maps were calculated using RAPID software. CONTRAST:  135mL OMNIPAQUE IOHEXOL 350 MG/ML SOLN COMPARISON:  CT head 01/28/2019 FINDINGS: CTA NECK FINDINGS Aortic arch: Proximal great vessels widely patent. Incomplete imaging of the aortic arch. Right carotid system: Right common carotid artery widely patent. 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 Right internal carotid artery remains occluded through the cavernous segment. Reconstitution of the supraclinoid internal carotid artery on the right. Left carotid system: Left carotid bifurcation widely patent without significant stenosis. Vertebral arteries: Both vertebral arteries are patent to the basilar without significant stenosis. Skeleton: No acute skeletal abnormality.  Poor dentition. Other neck: Thyroid goiter.  No mass lesion. Upper chest: Mild pleural thickening along the major fissure on  the right. Otherwise lungs clear. Review of the MIP images confirms the above findings CTA HEAD FINDINGS Anterior circulation: Right internal carotid artery is occluded through the cavernous segment with reconstitution of the supraclinoid segment. Both anterior and middle cerebral arteries are patent without significant stenosis or thrombus. Right anterior and middle cerebral arteries supplied through the anterior communicating artery. No significant posterior communicating artery identified on the right. Left cavernous carotid widely patent. Posterior circulation: Both vertebral arteries patent to the basilar. PICA patent. Basilar widely patent. AICA, superior cerebellar, posterior cerebral arteries patent bilaterally. Venous sinuses: Minimal venous contrast due to arterial phase scanning Anatomic variants: None Review of the MIP images confirms the above findings CT Brain Perfusion Findings: ASPECTS: 10 CBF (<30%) Volume: 30mL Perfusion (Tmax>6.0s) volume: 26mL Mismatch Volume: 281mL Infarction Location:Right MCA territory shows diffuse delayed perfusion. Core infarct in the right watershed territory in the deep white matter. IMPRESSION: 1. 13 mL core infarct in the watershed territory on the right. Large area of delayed perfusion right MCA territory due to occlusion of the right internal carotid artery. Right anterior middle cerebral arteries are patent and supplied through the anterior communicating artery. Collateral circulation is most likely the reason for delayed perfusion in the right MCA territory. 2. No other significant intracranial stenosis. 3. These results were called by telephone at the time of interpretation on 01/28/2019 at 9:54 pm to provider [facility] , who verbally acknowledged these results. Electronically Signed   By: Franchot Gallo M.D.   On: 01/28/2019 21:56   CT HEAD CODE STROKE WO CONTRAST  Result Date: 01/28/2019 CLINICAL DATA:  Code stroke. Stroke. Slurred speech left facial  droop EXAM: CT HEAD WITHOUT CONTRAST TECHNIQUE: Contiguous axial images were obtained from the base of the skull through the vertex without intravenous contrast. COMPARISON:  CT head 12/11/2017 FINDINGS: Brain: Moderate atrophy unchanged. Negative for hydrocephalus. Negative for acute cortical infarct, hemorrhage, mass. Hypodensity in the central lower pons could represent artifact versus acute infarct. Vascular: Negative for hyperdense vessel Skull: Negative Sinuses/Orbits: Chronic opacification left maxillary sinus with bony thickening unchanged. Mucosal edema left frontal and ethmoid sinus. Negative orbit. Other: None ASPECTS ([place] Stroke Program Early CT Score) - Ganglionic level infarction (caudate, lentiform nuclei, internal capsule, insula, M1-M3 cortex): 7 - Supraganglionic infarction (M4-M6 cortex):  3 Total score (0-10 with 10 being normal): 10 IMPRESSION: 1. No acute cortical infarct or hemorrhage. 2. Hypodensity in the pons could be artifact. 3. ASPECTS is 10 4. These results were called by telephone at the time of interpretation on 01/28/2019 at 9:36 pm to provider [facility] , who verbally acknowledged these results. Electronically Signed   By: Marlan Palau M.D.   On: 01/28/2019 21:37     Telemetry    Atrial fibrillation - Personally Reviewed  ECG    NSR with ST elevation in V4-V6 - Personally Reviewed   ASSESSMENT/PLAN:    1.  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  New onset atrial fibrillation with RVR -he was in NSR on arrival in ER -developed afib with HR in the low 100's -now back in NSR -2d echo tonight showed severe LV dysfunction with possible stress CM with apical ballooning and LV thrombus   3.  Abnormal EKG with ST elevation in lateral precordial leads -Dr. Katrinka Blazing with interventional cards felt the EKG changes reflective of acute carotid occlusion with  demand ischemia -hsTrop elevated to 15K -patient has had no CP or SOB -2D echo shows severe LV dysfunction with only the basal segments contracting.  There is apical ballooning with akinesis of the mid and apical segments.   -? Possible recent cardiac event and we are still seeing a trop elevation.  He has not felt well for days. -LV mural thrombus present -continue to cycle hsTrop -start IV Heparin per pharmacy - discussed with Neuro and ok to start IV heparin without a bolus -continue ASA -no BB due to hypotension  4.  Hypotension -likely related to afib with RVR in setting of severe LV dysfunction -2D echo with severe LV dysfunction -wean off phenylephrine and start Levophed -stop IVF due to severe LV dysfunction  The patient is critically ill with multiple organ systems failure and requires high complexity decision making for assessment and support, frequent evaluation and titration of therapies, application of advanced monitoring technologies and extensive interpretation of multiple databases. Critical Care Time devoted to patient care services described in this note independent of APP time is 60 minutes with >50% of time spent in direct patient care.     Armanda Magic, MD  01/29/2019  2:05 AM

## 2019-01-30 DIAGNOSIS — I5021 Acute systolic (congestive) heart failure: Secondary | ICD-10-CM

## 2019-01-30 DIAGNOSIS — D72829 Elevated white blood cell count, unspecified: Secondary | ICD-10-CM

## 2019-01-30 DIAGNOSIS — I639 Cerebral infarction, unspecified: Secondary | ICD-10-CM | POA: Diagnosis present

## 2019-01-30 DIAGNOSIS — I4891 Unspecified atrial fibrillation: Secondary | ICD-10-CM

## 2019-01-30 DIAGNOSIS — I2109 ST elevation (STEMI) myocardial infarction involving other coronary artery of anterior wall: Secondary | ICD-10-CM

## 2019-01-30 DIAGNOSIS — I2510 Atherosclerotic heart disease of native coronary artery without angina pectoris: Secondary | ICD-10-CM | POA: Diagnosis present

## 2019-01-30 DIAGNOSIS — E861 Hypovolemia: Secondary | ICD-10-CM

## 2019-01-30 DIAGNOSIS — I10 Essential (primary) hypertension: Secondary | ICD-10-CM | POA: Diagnosis present

## 2019-01-30 DIAGNOSIS — I6521 Occlusion and stenosis of right carotid artery: Secondary | ICD-10-CM

## 2019-01-30 DIAGNOSIS — I220 Subsequent ST elevation (STEMI) myocardial infarction of anterior wall: Secondary | ICD-10-CM

## 2019-01-30 DIAGNOSIS — E1165 Type 2 diabetes mellitus with hyperglycemia: Secondary | ICD-10-CM

## 2019-01-30 DIAGNOSIS — I9589 Other hypotension: Secondary | ICD-10-CM

## 2019-01-30 DIAGNOSIS — R55 Syncope and collapse: Secondary | ICD-10-CM

## 2019-01-30 DIAGNOSIS — E785 Hyperlipidemia, unspecified: Secondary | ICD-10-CM | POA: Diagnosis present

## 2019-01-30 DIAGNOSIS — I6340 Cerebral infarction due to embolism of unspecified cerebral artery: Secondary | ICD-10-CM

## 2019-01-30 LAB — BASIC METABOLIC PANEL
Anion gap: 17 — ABNORMAL HIGH (ref 5–15)
Anion gap: 13 (ref 5–15)
BUN: 47 mg/dL — ABNORMAL HIGH (ref 8–23)
BUN: 50 mg/dL — ABNORMAL HIGH (ref 8–23)
CO2: 11 mmol/L — ABNORMAL LOW (ref 22–32)
CO2: 15 mmol/L — ABNORMAL LOW (ref 22–32)
Calcium: 8.1 mg/dL — ABNORMAL LOW (ref 8.9–10.3)
Calcium: 8.3 mg/dL — ABNORMAL LOW (ref 8.9–10.3)
Chloride: 109 mmol/L (ref 98–111)
Chloride: 109 mmol/L (ref 98–111)
Creatinine, Ser: 1.91 mg/dL — ABNORMAL HIGH (ref 0.61–1.24)
Creatinine, Ser: 1.7 mg/dL — ABNORMAL HIGH (ref 0.61–1.24)
GFR calc Af Amer: 36 mL/min — ABNORMAL LOW (ref >60)
GFR calc Af Amer: 41 mL/min — ABNORMAL LOW (ref >60)
GFR calc non Af Amer: 31 mL/min — ABNORMAL LOW (ref >60)
GFR calc non Af Amer: 36 mL/min — ABNORMAL LOW (ref >60)
Glucose, Bld: 174 mg/dL — ABNORMAL HIGH (ref 70–99)
Glucose, Bld: 179 mg/dL — ABNORMAL HIGH (ref 70–99)
Potassium: 4.1 mmol/L (ref 3.5–5.1)
Potassium: 3.9 mmol/L (ref 3.5–5.1)
Sodium: 137 mmol/L (ref 135–145)
Sodium: 137 mmol/L (ref 135–145)

## 2019-01-30 LAB — GLUCOSE, CAPILLARY
Glucose-Capillary: 140 mg/dL — ABNORMAL HIGH (ref 70–99)
Glucose-Capillary: 162 mg/dL — ABNORMAL HIGH (ref 70–99)
Glucose-Capillary: 174 mg/dL — ABNORMAL HIGH (ref 70–99)
Glucose-Capillary: 153 mg/dL — ABNORMAL HIGH (ref 70–99)
Glucose-Capillary: 177 mg/dL — ABNORMAL HIGH (ref 70–99)
Glucose-Capillary: 167 mg/dL — ABNORMAL HIGH (ref 70–99)

## 2019-01-30 LAB — LDL CHOLESTEROL, DIRECT: Direct LDL: 58.5 mg/dL (ref 0–99)

## 2019-01-30 LAB — CBC
HCT: 31.6 % — ABNORMAL LOW (ref 39.0–52.0)
Hemoglobin: 10.6 g/dL — ABNORMAL LOW (ref 13.0–17.0)
MCH: 27.9 pg (ref 26.0–34.0)
MCHC: 33.5 g/dL (ref 30.0–36.0)
MCV: 83.2 fL (ref 80.0–100.0)
Platelets: 358 10*3/uL (ref 150–400)
RBC: 3.8 MIL/uL — ABNORMAL LOW (ref 4.22–5.81)
RDW: 13.1 % (ref 11.5–15.5)
WBC: 15.5 10*3/uL — ABNORMAL HIGH (ref 4.0–10.5)
nRBC: 0 % (ref 0.0–0.2)

## 2019-01-30 LAB — HEPARIN LEVEL (UNFRACTIONATED)
Heparin Unfractionated: 0.48 IU/mL (ref 0.30–0.70)
Heparin Unfractionated: 0.3 IU/mL (ref 0.30–0.70)

## 2019-01-30 LAB — LACTIC ACID, PLASMA
Lactic Acid, Venous: 2.1 mmol/L (ref 0.5–1.9)
Lactic Acid, Venous: 1.7 mmol/L (ref 0.5–1.9)

## 2019-01-30 LAB — MAGNESIUM: Magnesium: 2 mg/dL (ref 1.7–2.4)

## 2019-01-30 MED ORDER — AMIODARONE HCL IN DEXTROSE 360-4.14 MG/200ML-% IV SOLN
60.0000 mg/h | INTRAVENOUS | Status: AC
  Administered 2019-01-30 (×2): 60 mg/h via INTRAVENOUS
  Filled 2019-01-30: qty 200

## 2019-01-30 MED ORDER — LACTATED RINGERS IV BOLUS
500.0000 mL | Freq: Once | INTRAVENOUS | Status: AC
  Administered 2019-01-30: 500 mL via INTRAVENOUS

## 2019-01-30 MED ORDER — SODIUM BICARBONATE 650 MG PO TABS
650.0000 mg | ORAL_TABLET | Freq: Three times a day (TID) | ORAL | Status: DC
  Administered 2019-01-30 – 2019-02-05 (×20): 650 mg via ORAL
  Filled 2019-01-30 (×21): qty 1

## 2019-01-30 MED ORDER — AMIODARONE LOAD VIA INFUSION
150.0000 mg | Freq: Once | INTRAVENOUS | Status: AC
  Administered 2019-01-30: 150 mg via INTRAVENOUS
  Filled 2019-01-30: qty 83.34

## 2019-01-30 MED ORDER — AMIODARONE HCL IN DEXTROSE 360-4.14 MG/200ML-% IV SOLN
30.0000 mg/h | INTRAVENOUS | Status: DC
  Administered 2019-01-30 – 2019-02-01 (×4): 30 mg/h via INTRAVENOUS
  Filled 2019-01-30 (×5): qty 200

## 2019-01-30 NOTE — Progress Notes (Signed)
Progress Note  Patient Name: Adam Leon Date of Encounter: 01/30/2019  Primary Cardiologist: No primary care provider on file.   Subjective   Not able to communicate easily with the patient.  Inpatient Medications    Scheduled Meds: . aspirin  325 mg Per Tube Daily  . chlorhexidine  15 mL Mouth Rinse BID  . Chlorhexidine Gluconate Cloth  6 each Topical Daily  . insulin aspart  0-15 Units Subcutaneous Q4H  . mouth rinse  15 mL Mouth Rinse q12n4p  . pantoprazole (PROTONIX) IV  40 mg Intravenous Q12H  . sodium chloride flush  10-40 mL Intracatheter Q12H   Continuous Infusions: . amiodarone 30 mg/hr (01/30/19 1120)  . heparin 1,150 Units/hr (01/30/19 1000)  . norepinephrine (LEVOPHED) Adult infusion Stopped (01/30/19 0239)  . vasopressin (PITRESSIN) infusion - *FOR SHOCK* Stopped (01/30/19 0753)   PRN Meds: acetaminophen **OR** acetaminophen (TYLENOL) oral liquid 160 mg/5 mL **OR** acetaminophen, iohexol, ondansetron (ZOFRAN) IV, senna-docusate, sodium chloride flush   Vital Signs    Vitals:   01/30/19 0900 01/30/19 1000 01/30/19 1100 01/30/19 1200  BP: (!) 80/66   (!) 92/59  Pulse:   84   Resp: (!) 29 (!) 22 (!) 26 19  Temp:      TempSrc:      SpO2:   94%   Weight:      Height:        Intake/Output Summary (Last 24 hours) at 01/30/2019 1225 Last data filed at 01/30/2019 1100 Gross per 24 hour  Intake 1194.73 ml  Output 690 ml  Net 504.73 ml   Last 3 Weights 01/28/2019  Weight (lbs) 195 lb  Weight (kg) 88.451 kg      Telemetry    Sinus rhythm with occasional PVCs.- Personally Reviewed  ECG    Performed on 01/30/2019 demonstrates right bundle, persistent ST elevation V2 through V5.  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   Neck: No JVD Cardiac: RRR, no murmurs, rubs, or gallops.  Respiratory: Clear  to auscultation bilaterally. GI: Soft, nontender, non-distended  MS: No edema; No deformity. Neuro:  Nonfocal  Psych: Normal affect   Labs    High Sensitivity Troponin:   Recent Labs  Lab 01/28/19 2120 01/28/19 2313 01/29/19 1107  TROPONINIHS 15,519* 15,715* 11,893*      Chemistry Recent Labs  Lab 01/28/19 2120 01/28/19 2126 01/29/19 0240 01/29/19 1336 01/30/19 0334  NA 133* 134* 130* 137 137  K 4.6 4.6 4.0 3.8 4.1  CL 100 98 103  --  109  CO2 18*  --  12*  --  11*  GLUCOSE 225* 224* 285*  --  174*  BUN 54* 68* 47*  --  47*  CREATININE 2.32* 2.20* 1.94*  --  1.91*  CALCIUM 8.1*  --  7.4*  --  8.1*  PROT 6.0*  --  5.4*  --   --   ALBUMIN 2.3*  --  2.0*  --   --   AST 67*  --  68*  --   --   ALT 49*  --  50*  --   --   ALKPHOS 133*  --  146*  --   --   BILITOT 1.6*  --  1.6*  --   --   GFRNONAA 25*  --  30*  --  31*  GFRAA 28*  --  35*  --  36*  ANIONGAP 15  --  15  --  17*     Hematology Recent Labs  Lab 01/29/19 0240 01/29/19 1336 01/29/19 1355 01/30/19 0334  WBC 17.4*  --  18.6* 15.5*  RBC 4.31  --  4.08* 3.80*  HGB 11.8* 11.2* 11.3* 10.6*  HCT 35.8* 33.0* 33.2* 31.6*  MCV 83.1  --  81.4 83.2  MCH 27.4  --  27.7 27.9  MCHC 33.0  --  34.0 33.5  RDW 12.5  --  12.7 13.1  PLT 406*  --  428* 358    BNP Recent Labs  Lab 01/29/19 1107  BNP 4,321.1*     DDimer No results for input(s): DDIMER in the last 168 hours.   Radiology    CT Code Stroke CTA Head W/WO contrast  Result Date: 01/28/2019 CLINICAL DATA:  Stroke.  Slurred speech.  Left facial droop. EXAM: CT ANGIOGRAPHY HEAD AND NECK CT PERFUSION BRAIN TECHNIQUE: Multidetector CT imaging of the head and neck was performed using the standard protocol during bolus administration of intravenous contrast. Multiplanar CT image reconstructions and MIPs were obtained to evaluate the vascular anatomy. Carotid stenosis measurements (when applicable) are obtained utilizing NASCET criteria, using the distal  internal carotid diameter as the denominator. Multiphase CT imaging of the brain was performed following IV bolus contrast injection. Subsequent parametric perfusion maps were calculated using RAPID software. CONTRAST:  OMNIPAQUE IOHEXOL 350 MG/ML SOLN COMPARISON:  CT head 01/28/2019 FINDINGS: CTA NECK FINDINGS Aortic arch: Proximal great vessels widely patent. Incomplete imaging of the aortic arch. Right carotid system: Right common carotid artery widely patent. 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 Right internal carotid artery remains occluded through the cavernous segment. Reconstitution of the supraclinoid internal carotid artery on the right. Left carotid system: Left carotid bifurcation widely patent without significant stenosis. Vertebral arteries: Both vertebral arteries are patent to the basilar without significant stenosis. Skeleton: No acute skeletal abnormality.  Poor dentition. Other neck: Thyroid goiter.  No mass lesion. Upper chest: Mild pleural thickening along the major fissure on the right. Otherwise lungs clear. Review of the MIP images confirms the above findings CTA HEAD FINDINGS Anterior circulation: Right internal carotid artery is occluded through the cavernous segment with reconstitution of the supraclinoid segment. Both anterior and middle cerebral arteries are patent without significant stenosis or thrombus. Right anterior and middle cerebral arteries supplied through the anterior communicating artery. No significant posterior communicating artery identified on the right. Left cavernous carotid widely patent. Posterior circulation: Both vertebral arteries patent to the basilar. PICA patent. Basilar widely patent. AICA, superior cerebellar, posterior cerebral arteries patent bilaterally. Venous sinuses: Minimal venous contrast due to arterial phase scanning Anatomic variants: None Review of the MIP images confirms the above findings CT Brain Perfusion Findings:  ASPECTS: 10 CBF (<30%) Volume: 13mL Perfusion (Tmax>6.0s) volume: Mismatch Volume: Infarction Location:Right MCA territory shows diffuse delayed perfusion. Core infarct in the right watershed territory in the deep white matter. IMPRESSION: 1. 13 mL core infarct in the watershed territory on the right. Large area of delayed perfusion right MCA territory due to occlusion of the right internal carotid artery. Right anterior middle cerebral arteries are patent and supplied through the anterior communicating artery. Collateral circulation is most likely the reason for delayed perfusion in the right MCA territory. 2. No other significant intracranial stenosis. 3. These results were called by telephone at the time of interpretation on 01/28/2019 at 9:54 pm to provider [facility] , who verbally acknowledged  these results. Electronically Signed   By: Marlan Palau M.D.   On: 01/28/2019 21:56   CT Code Stroke CTA Neck W/WO contrast  Result Date: 01/28/2019 CLINICAL DATA:  Stroke.  Slurred speech.  Left facial droop. EXAM: CT ANGIOGRAPHY HEAD AND NECK CT PERFUSION BRAIN TECHNIQUE: Multidetector CT imaging of the head and neck was performed using the standard protocol during bolus administration of intravenous contrast. Multiplanar CT image reconstructions and MIPs were obtained to evaluate the vascular anatomy. Carotid stenosis measurements (when applicable) are obtained utilizing NASCET criteria, using the distal internal carotid diameter as the denominator. Multiphase CT imaging of the brain was performed following IV bolus contrast injection. Subsequent parametric perfusion maps were calculated using RAPID software. CONTRAST:  OMNIPAQUE IOHEXOL 350 MG/ML SOLN COMPARISON:  CT head 01/28/2019 FINDINGS: CTA NECK FINDINGS Aortic arch: Proximal great vessels widely patent. Incomplete imaging of the aortic arch. Right carotid system: Right common carotid artery widely patent. Right internal carotid  artery is occluded proximally. Right internal carotid artery remains occluded through the cavernous segment. Reconstitution of the supraclinoid internal carotid artery on the right. Left carotid system: Left carotid bifurcation widely patent without significant stenosis. Vertebral arteries: Both vertebral arteries are patent to the basilar without significant stenosis. Skeleton: No acute skeletal abnormality.  Poor dentition. Other neck: Thyroid goiter.  No mass lesion. Upper chest: Mild pleural thickening along the major fissure on the right. Otherwise lungs clear. Review of the MIP images confirms the above findings CTA HEAD FINDINGS Anterior circulation: Right internal carotid artery is occluded through the cavernous segment with reconstitution of the supraclinoid segment. Both anterior and middle cerebral arteries are patent without significant stenosis or thrombus. Right anterior and middle cerebral arteries supplied through the anterior communicating artery. No significant posterior communicating artery identified on the right. Left cavernous carotid widely patent. Posterior circulation: Both vertebral arteries patent to the basilar. PICA patent. Basilar widely patent. AICA, superior cerebellar, posterior cerebral arteries patent bilaterally. Venous sinuses: Minimal venous contrast due to arterial phase scanning Anatomic variants: None Review of the MIP images confirms the above findings CT Brain Perfusion Findings: ASPECTS: 10 CBF (<30%) Volume: 13mL Perfusion (Tmax>6.0s) volume: Mismatch Volume: Infarction Location:Right MCA territory shows diffuse delayed perfusion. Core infarct in the right watershed territory in the deep white matter. IMPRESSION: 1. 13 mL core infarct in the watershed territory on the right. Large area of delayed perfusion right MCA territory due to occlusion of the right internal carotid artery. Right anterior middle cerebral arteries are patent and supplied through the  anterior communicating artery. Collateral circulation is most likely the reason for delayed perfusion in the right MCA territory. 2. No other significant intracranial stenosis. 3. These results were called by telephone at the time of interpretation on 01/28/2019 at 9:54 pm to provider [facility] , who verbally acknowledged these results. Electronically Signed   By: Marlan Palau M.D.   On: 01/28/2019 21:56   MR MRA HEAD WO CONTRAST  Result Date: 01/29/2019 CLINICAL DATA:  Stroke follow-up. Left-sided weakness. Status post endovascular revascularization of occluded right ICA. EXAM: MRI HEAD WITHOUT CONTRAST MRA HEAD WITHOUT CONTRAST TECHNIQUE: Multiplanar, multiecho pulse sequences of the brain and surrounding structures were obtained without intravenous contrast. Angiographic images of the head were obtained using MRA technique without contrast. COMPARISON:  Head CT, CTA, and CTP 01/28/2019 FINDINGS: MRI HEAD FINDINGS The study is motion degraded throughout with some sequences being severely degraded (including nondiagnostic susceptibility weighted imaging). Brain: There are scattered small  acute to early subacute infarcts in the right cerebral hemisphere most notably involving frontoparietal cortex in the perirolandic region. Small infarcts are also present more inferiorly in the right parietal lobe and in the right globus pallidus, and there is a single punctate subcortical infarct in the left parietal lobe. There are small acute to early subacute bilateral cerebellar infarcts as well. Motion artifact limits assessment for intracranial hemorrhage. There is no midline shift or sizable extra-axial fluid collection. No age advanced chronic white matter disease is evident. Cerebral atrophy is not greater than expected for age. Vascular: Major intracranial vascular flow voids are grossly preserved. Skull and upper cervical spine: No gross marrow lesion. Sinuses/Orbits: Unremarkable orbits. Left maxillary  sinusitis. Left anterior ethmoid air cell opacification. No significant mastoid fluid. Other: None. MRA HEAD FINDINGS The study is intermittently severely motion degraded limiting detailed evaluation. The visualized distal vertebral arteries are patent to the basilar and codominant. Cerebellar arteries are not well seen due to motion. The basilar artery is patent with focal severe motion artifact through its midportion and no evidence of significant stenosis elsewhere. Both PCAs are patent proximally. The included distal cervical and intracranial portions of both internal carotid arteries are patent with assessment for stenosis limited by motion. A1 and M1 segments are patent bilaterally with limited assessment of the branch vessels. IMPRESSION: 1. Severely motion degraded examination. 2. Scattered small acute to early subacute infarcts in the cerebrum and cerebellum bilaterally, greatest in the right frontoparietal region. 3. Limited head MRA due to motion with patency of the large intracranial arteries as above. Electronically Signed   By: Sebastian Ache M.D.   On: 01/29/2019 14:04   MR BRAIN WO CONTRAST  Result Date: 01/29/2019 CLINICAL DATA:  Stroke follow-up. Left-sided weakness. Status post endovascular revascularization of occluded right ICA. EXAM: MRI HEAD WITHOUT CONTRAST MRA HEAD WITHOUT CONTRAST TECHNIQUE: Multiplanar, multiecho pulse sequences of the brain and surrounding structures were obtained without intravenous contrast. Angiographic images of the head were obtained using MRA technique without contrast. COMPARISON:  Head CT, CTA, and CTP 01/28/2019 FINDINGS: MRI HEAD FINDINGS The study is motion degraded throughout with some sequences being severely degraded (including nondiagnostic susceptibility weighted imaging). Brain: There are scattered small acute to early subacute infarcts in the right cerebral hemisphere most notably involving frontoparietal cortex in the perirolandic region. Small  infarcts are also present more inferiorly in the right parietal lobe and in the right globus pallidus, and there is a single punctate subcortical infarct in the left parietal lobe. There are small acute to early subacute bilateral cerebellar infarcts as well. Motion artifact limits assessment for intracranial hemorrhage. There is no midline shift or sizable extra-axial fluid collection. No age advanced chronic white matter disease is evident. Cerebral atrophy is not greater than expected for age. Vascular: Major intracranial vascular flow voids are grossly preserved. Skull and upper cervical spine: No gross marrow lesion. Sinuses/Orbits: Unremarkable orbits. Left maxillary sinusitis. Left anterior ethmoid air cell opacification. No significant mastoid fluid. Other: None. MRA HEAD FINDINGS The study is intermittently severely motion degraded limiting detailed evaluation. The visualized distal vertebral arteries are patent to the basilar and codominant. Cerebellar arteries are not well seen due to motion. The basilar artery is patent with focal severe motion artifact through its midportion and no evidence of significant stenosis elsewhere. Both PCAs are patent proximally. The included distal cervical and intracranial portions of both internal carotid arteries are patent with assessment for stenosis limited by motion. A1 and M1 segments are patent  bilaterally with limited assessment of the branch vessels. IMPRESSION: 1. Severely motion degraded examination. 2. Scattered small acute to early subacute infarcts in the cerebrum and cerebellum bilaterally, greatest in the right frontoparietal region. 3. Limited head MRA due to motion with patency of the large intracranial arteries as above. Electronically Signed   By: Sebastian Ache M.D.   On: 01/29/2019 14:04   CT C-SPINE NO CHARGE  Result Date: 01/28/2019 CLINICAL DATA:  Trauma.  Rule out spine fracture EXAM: CT CERVICAL SPINE WITHOUT CONTRAST TECHNIQUE: Multidetector  CT imaging of the cervical spine was performed without intravenous contrast. Multiplanar CT image reconstructions were also generated. COMPARISON:  None. FINDINGS: Alignment: Normal Skull base and vertebrae: Negative for fracture Soft tissues and spinal canal: Goiter with enlargement of the right lobe of the thyroid. No adenopathy in the neck. Disc levels: Mild disc degeneration and spurring C5-6 with foraminal narrowing bilaterally. Upper chest: Mild thickening of the major fissure on the right. Otherwise lung apices clear. Other: None IMPRESSION: Negative for cervical spine fracture. Electronically Signed   By: Marlan Palau M.D.   On: 01/28/2019 21:59   CT Code Stroke Cerebral Perfusion with contrast  Result Date: 01/28/2019 CLINICAL DATA:  Stroke.  Slurred speech.  Left facial droop. EXAM: CT ANGIOGRAPHY HEAD AND NECK CT PERFUSION BRAIN TECHNIQUE: Multidetector CT imaging of the head and neck was performed using the standard protocol during bolus administration of intravenous contrast. Multiplanar CT image reconstructions and MIPs were obtained to evaluate the vascular anatomy. Carotid stenosis measurements (when applicable) are obtained utilizing NASCET criteria, using the distal internal carotid diameter as the denominator. Multiphase CT imaging of the brain was performed following IV bolus contrast injection. Subsequent parametric perfusion maps were calculated using RAPID software. CONTRAST:  OMNIPAQUE IOHEXOL 350 MG/ML SOLN COMPARISON:  CT head 01/28/2019 FINDINGS: CTA NECK FINDINGS Aortic arch: Proximal great vessels widely patent. Incomplete imaging of the aortic arch. Right carotid system: Right common carotid artery widely patent. 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 Right internal carotid artery remains occluded through the cavernous segment. Reconstitution of the supraclinoid internal carotid artery on the right. Left carotid system: Left carotid bifurcation widely patent  without significant stenosis. Vertebral arteries: Both vertebral arteries are patent to the basilar without significant stenosis. Skeleton: No acute skeletal abnormality.  Poor dentition. Other neck: Thyroid goiter.  No mass lesion. Upper chest: Mild pleural thickening along the major fissure on the right. Otherwise lungs clear. Review of the MIP images confirms the above findings CTA HEAD FINDINGS Anterior circulation: Right internal carotid artery is occluded through the cavernous segment with reconstitution of the supraclinoid segment. Both anterior and middle cerebral arteries are patent without significant stenosis or thrombus. Right anterior and middle cerebral arteries supplied through the anterior communicating artery. No significant posterior communicating artery identified on the right. Left cavernous carotid widely patent. Posterior circulation: Both vertebral arteries patent to the basilar. PICA patent. Basilar widely patent. AICA, superior cerebellar, posterior cerebral arteries patent bilaterally. Venous sinuses: Minimal venous contrast due to arterial phase scanning Anatomic variants: None Review of the MIP images confirms the above findings CT Brain Perfusion Findings: ASPECTS: 10 CBF (<30%) Volume: 13mL Perfusion (Tmax>6.0s) volume: Mismatch Volume: Infarction Location:Right MCA territory shows diffuse delayed perfusion. Core infarct in the right watershed territory in the deep white matter. IMPRESSION: 1. 13 mL core infarct in the watershed territory on the right. Large area of delayed perfusion right MCA territory due to occlusion of the right internal  carotid artery. Right anterior middle cerebral arteries are patent and supplied through the anterior communicating artery. Collateral circulation is most likely the reason for delayed perfusion in the right MCA territory. 2. No other significant intracranial stenosis. 3. These results were called by telephone at the time of  interpretation on 01/28/2019 at 9:54 pm to provider [facility] , who verbally acknowledged these results. Electronically Signed   By: Franchot Gallo M.D.   On: 01/28/2019 21:56   DG CHEST PORT 1 VIEW  Result Date: 01/29/2019 CLINICAL DATA:  Stroke due to embolism EXAM: PORTABLE CHEST 1 VIEW COMPARISON:  01/15/2019 FINDINGS: Interstitial coarsening with Awanda Mink lines. No visible effusion or pneumothorax. Normal heart size and stable mediastinal contours. Enteric tube tip and side-port reaches the stomach. IMPRESSION: Interstitial pulmonary edema. Electronically Signed   By: Monte Fantasia M.D.   On: 01/29/2019 05:08   DG Abd Portable 1V  Result Date: 01/29/2019 CLINICAL DATA:  OG tube placement. EXAM: PORTABLE ABDOMEN - 1 VIEW COMPARISON:  None. FINDINGS: Tip of the enteric tube is below the diaphragm in the stomach, the side port is in the region of the gastroesophageal junction. Recommend advancement of least 3 cm for optimal placement. Excreted IV contrast in both renal collecting systems and the urinary bladder from prior IV contrast. Lobular contour of the bladder suggests bladder diverticula or chronic bladder outlet obstruction no bowel obstruction. Surgical clips in the right upper quadrant likely from cholecystectomy. IMPRESSION: Tip of the enteric tube below the diaphragm in the stomach, the side-port in the region of the gastroesophageal junction. Recommend advancement of least 3 cm for optimal placement. Electronically Signed   By: Keith Rake M.D.   On: 01/29/2019 03:27   ECHOCARDIOGRAM COMPLETE  Result Date: 01/29/2019   ECHOCARDIOGRAM REPORT   Patient Name:   Adam Leon Date of Exam: 01/29/2019 Medical Rec #:  [id number]       Height:       72.0 in Accession #:    [id number]      Weight:       195.0 lb Date of Birth:  01-Nov-1930       BSA:          2.11 m Patient Age:    85 years        BP:           120/63 mmHg Patient Gender: M               HR:           71 bpm. Exam  Location:  Inpatient Procedure: 2D Echo        STAT ECHO Reported to: Dr. Radford Pax. Indications:     stroke  History:         Patient has no prior history of Echocardiogram examinations.                  Signs/Symptoms:elevated troponin.  Sonographer:     Johny Chess Referring Phys:  [phone] TRACI R TURNER Diagnosing Phys: Fransico Him MD IMPRESSIONS  1. Left ventricular ejection fraction, by visual estimation, is <20%. The left ventricle has severely decreased function. Left ventricular septal wall thickness was mildly increased. Mildly increased left ventricular posterior wall thickness.  2. There is apical ballooning with akinesis of the entire apical wall. There is akinesis of the mid infero and anteroseptum. There is akinesis of the mid and apical inferior, anterior and lateral walls and akineseis of the apical inferolateral wall.  3. Moderate,  fixed thrombus on the apical wall of the left ventricle.  4. Definity contrast agent was given IV to delineate the left ventricular endocardial borders.  5. Global right ventricle has normal systolic function.The right ventricular size is normal. No increase in right ventricular wall thickness.  6. Left atrial size was normal.  7. Right atrial size was normal.  8. The mitral valve is normal in structure. Trivial mitral valve regurgitation. No evidence of mitral stenosis.  9. The tricuspid valve is normal in structure. Tricuspid valve regurgitation is mild. 10. The aortic valve is tricuspid. Aortic valve regurgitation is not visualized. No evidence of aortic valve sclerosis or stenosis. 11. The pulmonic valve was normal in structure. Pulmonic valve regurgitation is not visualized. 12. Normal pulmonary artery systolic pressure. 13. The inferior vena cava is normal in size with greater than 50% respiratory variability, suggesting right atrial pressure of 3 mmHg. 14. Left ventricular diastolic parameters are consistent with Grade I diastolic dysfunction (impaired  relaxation). FINDINGS  Left Ventricle: Left ventricular ejection fraction, by visual estimation, is <20%. The left ventricle has severely decreased function. Definity contrast agent was given IV to delineate the left ventricular endocardial borders. Mildly increased left ventricular posterior wall thickness. Left ventricular diastolic parameters are consistent with Grade I diastolic dysfunction (impaired relaxation). There is a moderate, fixed, apical left ventricular thrombus. The thrombus appears flat (mural) in shape.  There is apical ballooning with akinesis of the entire apical wall. There is akinesis of the mid infero and anteroseptum. There is akinesis of the mid and apical inferior, anterior and lateral walls and akineseis of the apical inferolateral wall. Right Ventricle: The right ventricular size is normal. No increase in right ventricular wall thickness. Global RV systolic function is has normal systolic function. The tricuspid regurgitant velocity is 2.47 m/s, and with an assumed right atrial pressure  of 3 mmHg, the estimated right ventricular systolic pressure is normal at 27.4 mmHg. Left Atrium: Left atrial size was normal in size. Right Atrium: Right atrial size was normal in size Pericardium: There is no evidence of pericardial effusion. Mitral Valve: The mitral valve is normal in structure. Trivial mitral valve regurgitation. No evidence of mitral valve stenosis by observation. Tricuspid Valve: The tricuspid valve is normal in structure. Tricuspid valve regurgitation is mild. Aortic Valve: The aortic valve is tricuspid. . There is moderate thickening and moderate calcification of the aortic valve. Aortic valve regurgitation is not visualized. The aortic valve is structurally normal, with no evidence of sclerosis or stenosis. There is moderate thickening of the aortic valve. There is moderate calcification of the aortic valve. Pulmonic Valve: The pulmonic valve was normal in structure. Pulmonic  valve regurgitation is not visualized. Pulmonic regurgitation is not visualized. Aorta: The aortic root, ascending aorta and aortic arch are all structurally normal, with no evidence of dilitation or obstruction. Venous: The inferior vena cava is normal in size with greater than 50% respiratory variability, suggesting right atrial pressure of 3 mmHg. IAS/Shunts: No atrial level shunt detected by color flow Doppler. There is no evidence of a patent foramen ovale. No ventricular septal defect is seen or detected. There is no evidence of an atrial septal defect.  LEFT VENTRICLE PLAX 2D LVIDd:         4.20 cm  Diastology LVIDs:         3.00 cm  LV e' lateral: 13.40 cm/s LV PW:         1.20 cm  LV e' medial:  6.20 cm/s  LV IVS:        1.20 cm LVOT diam:     1.90 cm LV SV:         44 ml LV SV Index:   20.45 LVOT Area:     2.84 cm  RIGHT VENTRICLE RV S prime:     13.30 cm/s TAPSE (M-mode): 2.3 cm LEFT ATRIUM             Index       RIGHT ATRIUM           Index LA diam:        3.40 cm 1.61 cm/m  RA Area:     12.10 cm LA Vol (A2C):   56.0 ml 26.57 ml/m RA Volume:   28.80 ml  13.66 ml/m LA Vol (A4C):   33.7 ml 15.99 ml/m LA Biplane Vol: 44.3 ml 21.02 ml/m  AORTIC VALVE LVOT Vmax:   54.80 cm/s LVOT Vmean:  41.400 cm/s LVOT VTI:    0.150 m  AORTA Ao Root diam: 2.80 cm TRICUSPID VALVE TR Peak grad:   24.4 mmHg TR Vmax:        247.00 cm/s  SHUNTS Systemic VTI:  0.15 m Systemic Diam: 1.90 cm  Armanda Magic MD Electronically signed by Armanda Magic MD Signature Date/Time: 01/29/2019/4:31:22 AM    Final (Updated)    CT HEAD CODE STROKE WO CONTRAST  Result Date: 01/28/2019 CLINICAL DATA:  Code stroke. Stroke. Slurred speech left facial droop EXAM: CT HEAD WITHOUT CONTRAST TECHNIQUE: Contiguous axial images were obtained from the base of the skull through the vertex without intravenous contrast. COMPARISON:  CT head 12/11/2017 FINDINGS: Brain: Moderate atrophy unchanged. Negative for hydrocephalus. Negative for acute cortical  infarct, hemorrhage, mass. Hypodensity in the central lower pons could represent artifact versus acute infarct. Vascular: Negative for hyperdense vessel Skull: Negative Sinuses/Orbits: Chronic opacification left maxillary sinus with bony thickening unchanged. Mucosal edema left frontal and ethmoid sinus. Negative orbit. Other: None ASPECTS (Alberta Stroke Program Early CT Score) - Ganglionic level infarction (caudate, lentiform nuclei, internal capsule, insula, M1-M3 cortex): 7 - Supraganglionic infarction (M4-M6 cortex): 3 Total score (0-10 with 10 being normal): 10 IMPRESSION: 1. No acute cortical infarct or hemorrhage. 2. Hypodensity in the pons could be artifact. 3. ASPECTS is 10 4. These results were called by telephone at the time of interpretation on 01/28/2019 at 9:36 pm to provider [facility] , who verbally acknowledged these results. Electronically Signed   By: Marlan Palau M.D.   On: 01/28/2019 21:37   VAS US CAROTID  Result Date: 01/29/2019 Carotid Arterial Duplex Study Indications:       Post thrombectomy. Comparison Study:  No prior study. Performing Technologist: Gertie Fey MHA, RDMS, RVT, RDCS  Examination Guidelines: A complete evaluation includes B-mode imaging, spectral Doppler, color Doppler, and power Doppler as needed of all accessible portions of each vessel. Bilateral testing is considered an integral part of a complete examination. Limited examinations for reoccurring indications may be performed as noted.  Right Carotid Findings: +----------+--------+--------+--------+-----------------------+--------+           PSV cm/sEDV cm/sStenosisPlaque Description     Comments +----------+--------+--------+--------+-----------------------+--------+ CCA Prox  60      16                                              +----------+--------+--------+--------+-----------------------+--------+ CCA Distal52  12              smooth and heterogenous          +----------+--------+--------+--------+-----------------------+--------+ ICA Prox  49      11              smooth and heterogenous         +----------+--------+--------+--------+-----------------------+--------+ ICA Distal90      28                                              +----------+--------+--------+--------+-----------------------+--------+ ECA       60      11                                              +----------+--------+--------+--------+-----------------------+--------+ +---------+--------+--+--------+--+---------+ VertebralPSV cm/s49EDV cm/s13Antegrade +---------+--------+--+--------+--+---------+  Summary: Right Carotid: Velocities in the right ICA are consistent with a 1-39% stenosis.  *See table(s) above for measurements and observations.    Preliminary    [place] EKG SITE RITE  Result Date: 01/29/2019 If Site Rite image not attached, placement could not be confirmed due to current cardiac rhythm.   Cardiac Studies    As noted, the echo demonstrates mural thrombus in the LV.  Chest x-ray: 1217 suggested interstitial edema  BNP is 4300  Patient Profile     85 y.o. male with hyperlipidemia, hypertension, diabetes, dementia who presented with who presented with right-sided gaze preference and left-sided hemiplegia.  Subsequently underwent thrombectomy of left carotid/internal carotid.  Cardiac high-sensitivity troponins were elevated on admission and EKG demonstrated anterior ST elevation.  ST elevation has persisted, enzymes are decreasing, thrombus in LV apex, felt to be the source of the patient's stroke on an embolic basis.  Assessment & Plan    1. Recent anteroapical myocardial infarction has been noted by declining troponin I values. 2. Left ventricular apical thrombus, likely contributing to the patient's stroke syndrome via embolization.  Needs anticoagulation therapy 3. Acute systolic heart failure, secondary to recent anterior myocardial  infarction.  Has blood pressure and clinical state allow, guideline directed therapy for systolic heart failure will be instituted (low-dose beta-blocker/angiotensin receptor blocker/possibly mineralocorticoid antagonist).  IV Lasix as tolerated by blood pressure.  Myocardial infarction was large.  Given his age and comorbidities, mortality will be high.  Mental status, recent stroke, and age removed hemodynamic support as a treatment option. 4. Low blood pressure, related to recent infarction (cardiogenic), age, and neuro status. 5. Right brain CVA status post thrombectomy     For questions or updates, please contact [facility] Please consult [email or web address] for contact info under        Signed, Lesleigh Noe, MD  01/30/2019, 12:25 PM

## 2019-01-30 NOTE — Progress Notes (Signed)
Rehab Admissions Coordinator Note:  Per therapy recommendations, patient was screened by Michel Santee for appropriateness for an Inpatient Acute Rehab Consult.  At this time, we are recommending Inpatient Rehab consult. I will place an order.   Michel Santee, PT, DPT 01/30/2019, 1:23 PM  I can be reached at [phone].

## 2019-01-30 NOTE — Consult Note (Signed)
NAME:  Adam Leon, MRN:  [id number], DOB:  1931/02/12, LOS: 2 ADMISSION DATE:  01/28/2019, CONSULTATION DATE:  01/29/19 REFERRING MD:  Wilford CornerArora  CHIEF COMPLAINT:  R ICA occlusion   Brief History   Adam Leon is a 85 y.o. male who was admitted 12/16 with R ICA occlusion.  He was taken to IR for revascularization.  History of present illness   Adam Leon is a 85 y.o. male who has a PMH including but not limited to syncope, HLD.  [facility] with left hemiplegia.  CTA showed R ICA occlusion.  He was subsequently taken to IR for arteriogram with complete revascularization of occluded extracranial and intracranial R ICA.  Post IR, he had hypotension.  Goal SBP was 120 - 140; therefore, he was started on neosynephrine and PCCM was consulted for assistance.  Of note, he had trop of > [id number].  Initial EKG concerning for anterior infarct.  Cards consulted by EDP who felt might represent subacute insult.  Recommended supportive care and echo following carotid revascularization.  Formal consult pending.  Past Medical History  has Acute ischemic stroke (HCC); Internal carotid artery occlusion, right; AKI (acute kidney injury) (HCC); Hypotension due to hypovolemia; Troponin level elevated; Acute CVA (cerebrovascular accident) (HCC); Dyslipidemia; Essential hypertension; Syncope; New onset atrial fibrillation (HCC); Coronary artery disease involving native coronary artery of native heart without angina pectoris; Uncontrolled type 2 diabetes mellitus with hyperglycemia (HCC); and Leukocytosis on their problem list.  Significant Hospital Events   12/16 > admitted, had IR revascularization.  Consults:  PCCM, cardiology.  Procedures:  ETT 12/16 > 12/16. Left radial art line 12/16 >   Significant Diagnostic Tests:  CT head 12/16 > no acute infarct. CTA head / neck 12/16 > 13ml core infarct on right.  Occlusion of right ICA. MRI brain 12/17 >  Echo 12/17 >   Micro Data:    SARS CoV2 12/16 > neg. Flu 12/16 > neg.  Antimicrobials:  None.   Interim history/subjective:  Weaned off levophed. Awake and interactive.  Objective:  Blood pressure (!) 81/52, pulse (!) 56, temperature (!) 97.2 F (36.2 C), temperature source Axillary, resp. rate 17, height 6' (1.829 m), weight 88.5 kg, SpO2 100 %.        Intake/Output Summary (Last 24 hours) at 01/30/2019 1638 Last data filed at 01/30/2019 1600 Gross per 24 hour  Intake 1145.04 ml  Output 440 ml  Net 705.04 ml   Filed Weights   01/28/19 2155  Weight: 88.5 kg   Physical Exam: General: Elderly-appearing, no acute distress, confused HENT: [place], AT, OP clear, MMM, hearing loss Eyes: EOMI, no scleral icterus Respiratory: Clear to auscultation bilaterally.  No crackles, wheezing or rales Cardiovascular: RRR, -M/R/G, no JVD GI: BS+, soft, nontender Extremities:-Edema,-tenxderness Neuro: Awake, alert, follows commands, moves extremities x 4, strength 4/5 bilaterally in upper and lower extremities  Assessment & Plan:   Hypotension - resolved. Initially thought to be related to sedation overnight however remains persistent. Consider cardiogenic shock in setting of elevated troponin, lateral ST elevation on EKG and newly? depressed EF. Peak troponin 15K. --Off vasopressor support --Gentle fluids 100cc/hr x 5hours  Acute right CVA secondary to R ICA occlusion s/p embolectomy. Likely embolic CVA in setting of LV thrombus --Imaging and anticoagulation per Neuro --ASA  Anterior STEMI - Peak trop 15K Stress cardiomyopathy AFRVR LV Thrombus --Cardiology following. Not a candidate for cath due to CVA --Heparin gtt  AKI likely pre-renal Metabolic acidosis --Monitor UOP/Cr --Check  BMP and LA --Start sodium bicarbonate tablets TID  Best Practice:  Diet: Dysphagia diet Pain/Anxiety/Delirium protocol (if indicated): N/A. VAP protocol (if indicated): N/A. DVT prophylaxis: SCD's. GI prophylaxis:  N/A. Glucose control: SSI. Mobility: Bedrest. Code Status: Full. Family Communication: Daughter at bedside 12/18 Disposition: Remain on ICU service for tenuous pressure status. Can sign off if stable.  Labs   CBC: Recent Labs  Lab 01/28/19 2120 01/28/19 2126 01/29/19 0240 01/29/19 1336 01/29/19 1355 01/30/19 0334  WBC 15.0*  --  17.4*  --  18.6* 15.5*  NEUTROABS 12.1*  --  14.5*  --  14.8*  --   HGB 12.9* 13.6 11.8* 11.2* 11.3* 10.6*  HCT 38.8* 40.0 35.8* 33.0* 33.2* 31.6*  MCV 83.3  --  83.1  --  81.4 83.2  PLT 359  --  406*  --  428* 358   Basic Metabolic Panel: Recent Labs  Lab 01/28/19 2120 01/28/19 2126 01/29/19 0240 01/29/19 1336 01/30/19 0334 01/30/19 1125  NA 133* 134* 130* 137 137  --   K 4.6 4.6 4.0 3.8 4.1  --   CL 100 98 103  --  109  --   CO2 18*  --  12*  --  11*  --   GLUCOSE 225* 224* 285*  --  174*  --   BUN 54* 68* 47*  --  47*  --   CREATININE 2.32* 2.20* 1.94*  --  1.91*  --   CALCIUM 8.1*  --  7.4*  --  8.1*  --   MG  --   --   --   --   --  2.0   GFR: Estimated Creatinine Clearance: 30.5 mL/min (A) (by C-G formula based on SCr of 1.91 mg/dL (H)). Recent Labs  Lab 01/28/19 2120 01/29/19 0240 01/29/19 1330 01/29/19 1355 01/29/19 1554 01/30/19 0334  WBC 15.0* 17.4*  --  18.6*  --  15.5*  LATICACIDVEN  --   --  2.2*  --  2.6* 2.1*   Liver Function Tests: Recent Labs  Lab 01/28/19 2120 01/29/19 0240  AST 67* 68*  ALT 49* 50*  ALKPHOS 133* 146*  BILITOT 1.6* 1.6*  PROT 6.0* 5.4*  ALBUMIN 2.3* 2.0*   No results for input(s): LIPASE, AMYLASE in the last 168 hours. No results for input(s): AMMONIA in the last 168 hours. ABG    Component Value Date/Time   PHART 7.434 01/29/2019 1336   PCO2ART 26.3 (L) 01/29/2019 1336   PO2ART 74.0 (L) 01/29/2019 1336   HCO3 17.7 (L) 01/29/2019 1336   TCO2 19 (L) 01/29/2019 1336   ACIDBASEDEF 5.0 (H) 01/29/2019 1336   O2SAT 96.0 01/29/2019 1336    Coagulation Profile: No results for  input(s): INR, PROTIME in the last 168 hours. Cardiac Enzymes: No results for input(s): CKTOTAL, CKMB, CKMBINDEX, TROPONINI in the last 168 hours. HbA1C: Hgb A1c MFr Bld  Date/Time Value Ref Range Status  01/29/2019 02:22 AM 8.8 (H) 4.8 - 5.6 % Final    Comment:    (NOTE) Pre diabetes:          5.7%-6.4% Diabetes:              >6.4% Glycemic control for   <7.0% adults with diabetes    CBG: Recent Labs  Lab 01/29/19 2318 01/30/19 0322 01/30/19 0751 01/30/19 1207 01/30/19 1549  GLUCAP 118* 140* 162* 174* 153*   Critical care time: 35 min.   The patient is critically ill with multiple organ systems failure and  requires high complexity decision making for assessment and support, frequent evaluation and titration of therapies, application of advanced monitoring technologies and extensive interpretation of multiple databases.   Critical Care Time devoted to patient care services described in this note is 35 Minutes. This time reflects time of care of this signee Dr. Rodman Pickle.   Rodman Pickle, M.D. [facility] Pulmonary/Critical Care Medicine 01/30/2019 4:38 PM   Please see Amion for pager number to reach on-call Pulmonary and Critical Care Team.

## 2019-01-30 NOTE — Progress Notes (Signed)
  Speech Language Pathology Treatment: Dysphagia  Patient Details Name: Adam Leon MRN: [id number] DOB: February 05, 1931 Today's Date: 01/30/2019 Time: 1009-1020 SLP Time Calculation (min) (ACUTE ONLY): 11 min  Assessment / Plan / Recommendation Clinical Impression  Pt appears to have improved clinical presentation for oropharyngeal swallow compared to previous date; question if presence of NGT was impacting performance as this is now pulled today. He still had multiple swallows with his first several trials of thin liquids, reducing in frequency as boluses continued. Suspect this is related to dry mouth/throat, which also leads to mild but diffuse oral residue with solids. He did pass a three ounce water challenge today. Given concern for possible esophageal issues and xerostomia, recommend starting Dys 2 diet and thin liquids with use of aspiration and esophageal precautions. SLP will f/u for tolerance with potential to advance.    HPI HPI: Adam Leon is a 85 y.o. male who has a PMH including but not limited to syncope, HLD.  He presented to [facility] ED 12/16 with left hemiplegia.  CTA showed R ICA occlusion.  He was subsequently taken to IR for arteriogram with complete revascularization of occluded extracranial and intracranial R ICA. CXR 12/17: interstitial edema.  CT 12/16: R MCA infarct in deep white matter      SLP Plan  Continue with current plan of care       Recommendations  Diet recommendations: Dysphagia 2 (fine chop);Thin liquid Liquids provided via: Cup;Straw Medication Administration: Whole meds with puree Supervision: Patient able to self feed;Intermittent supervision to cue for compensatory strategies Compensations: Slow rate;Small sips/bites;Follow solids with liquid Postural Changes and/or Swallow Maneuvers: Seated upright 90 degrees;Upright 30-60 min after meal                Oral Care Recommendations: Oral care BID Follow up Recommendations: Inpatient Rehab SLP  Visit Diagnosis: Dysphagia, unspecified (R13.10) Plan: Continue with current plan of care       GO                Venita Sheffield Enzo Treu 01/30/2019, 11:09 AM  Pollyann Glen, M.A. [place] Acute Environmental education officer [phone] Office [phone]

## 2019-01-30 NOTE — Progress Notes (Signed)
EKG CRITICAL VALUE     12 lead EKG performed.  Critical value noted.  Janann August, RN notified.   Asencion Gowda, CCT 01/30/2019 6:49 AM

## 2019-01-30 NOTE — Progress Notes (Signed)
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 Corliss Skains. Patient awake and alert sitting in chair. Oriented to person only. Left facial droop. Can spontaneously move all extremities. Right groin incision c/d/i.  MRI/MRA brain/head 01/29/2019: 1. Severely motion degraded examination. 2. Scattered small acute to early subacute infarcts in the cerebrum and cerebellum bilaterally, greatest in the right frontoparietal region. 3. Limited head MRA due to motion with patency of the large intracranial arteries as above.   Allergies: Patient has no known allergies.  Medications: Prior to Admission medications   Medication Sig Start Date End Date Taking? Authorizing Provider  aspirin 81 MG tablet Take 81 mg by mouth daily.   Yes [provider]  atenolol (TENORMIN) 25 MG tablet Take 25 mg by mouth daily. 09/17/17  Yes [provider]  lisinopril (PRINIVIL,ZESTRIL) 5 MG tablet Take 5 mg by mouth daily. 09/17/17  Yes [provider]  metFORMIN (GLUCOPHAGE-XR) 500 MG 24 hr tablet Take 500 mg by mouth 2 (two) times daily. 09/17/17  Yes [provider]  pravastatin (PRAVACHOL) 40 MG tablet Take 40 mg by mouth daily. 09/17/17  Yes [provider]     Vital Signs: BP (!) 80/66   Pulse (!) 29   Temp (!) 97.2 F (36.2 C) (Axillary)   Resp (!) 22   Ht 6' (1.829 m)   Wt 195 lb (88.5 kg)   SpO2 (!) 87%   BMI 26.45 kg/m   Physical Exam Vitals and nursing note reviewed.  Constitutional:      General: He is not in acute distress.    Appearance: Normal appearance.  Pulmonary:     Effort: Pulmonary effort is normal. No respiratory distress.  Skin:    General: Skin is warm and dry.     Comments: Right groin incision soft without active bleeding or hematoma.  Neurological:     Mental Status: He is alert.     Comments: Alert and awake, oriented to person only. Speech and comprehension intact. PERRL bilaterally. Mild left facial droop. Tongue midline. Can spontaneously move all extremities. No pronator drift. Distal pulses 1+ bilaterally.     Imaging: CT Code Stroke CTA Head W/WO contrast  Result Date: 01/28/2019 CLINICAL DATA:  Stroke.  Slurred speech.  Left facial droop. EXAM: CT ANGIOGRAPHY HEAD AND NECK CT PERFUSION BRAIN TECHNIQUE: Multidetector CT imaging of the head and neck was performed using the standard protocol during bolus administration of intravenous contrast. Multiplanar CT image reconstructions and MIPs were obtained to evaluate the vascular anatomy. Carotid stenosis measurements (when applicable) are obtained utilizing NASCET criteria, using the distal internal carotid diameter as the denominator. Multiphase CT imaging of the brain was performed following IV bolus contrast injection. Subsequent parametric perfusion maps were calculated using RAPID software. CONTRAST:  OMNIPAQUE IOHEXOL 350 MG/ML SOLN COMPARISON:  CT head 01/28/2019 FINDINGS: CTA NECK FINDINGS Aortic arch: Proximal great vessels widely patent. Incomplete imaging of the aortic arch. Right carotid system: Right common carotid artery widely patent. 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 Right internal carotid artery remains occluded through the cavernous segment. Reconstitution of the supraclinoid internal carotid artery on the right. Left carotid system: Left carotid bifurcation widely patent without significant stenosis. Vertebral arteries: Both vertebral arteries are patent  to the basilar without significant stenosis. Skeleton: No acute skeletal abnormality.  Poor dentition. Other neck: Thyroid goiter.  No mass lesion. Upper chest: Mild  pleural thickening along the major fissure on the right. Otherwise lungs clear. Review of the MIP images confirms the above findings CTA HEAD FINDINGS Anterior circulation: Right internal carotid artery is occluded through the cavernous segment with reconstitution of the supraclinoid segment. Both anterior and middle cerebral arteries are patent without significant stenosis or thrombus. Right anterior and middle cerebral arteries supplied through the anterior communicating artery. No significant posterior communicating artery identified on the right. Left cavernous carotid widely patent. Posterior circulation: Both vertebral arteries patent to the basilar. PICA patent. Basilar widely patent. AICA, superior cerebellar, posterior cerebral arteries patent bilaterally. Venous sinuses: Minimal venous contrast due to arterial phase scanning Anatomic variants: None Review of the MIP images confirms the above findings CT Brain Perfusion Findings: ASPECTS: 10 CBF (<30%) Volume: 33mL Perfusion (Tmax>6.0s) volume: Mismatch Volume: Infarction Location:Right MCA territory shows diffuse delayed perfusion. Core infarct in the right watershed territory in the deep white matter. IMPRESSION: 1. 13 mL core infarct in the watershed territory on the right. Large area of delayed perfusion right MCA territory due to occlusion of the right internal carotid artery. Right anterior middle cerebral arteries are patent and supplied through the anterior communicating artery. Collateral circulation is most likely the reason for delayed perfusion in the right MCA territory. 2. No other significant intracranial stenosis. 3. These results were called by telephone at the time of interpretation on 01/28/2019 at 9:54 pm to provider [facility] , who verbally acknowledged these results. Electronically Signed   By: Marlan Palau M.D.   On: 01/28/2019 21:56   CT Code Stroke CTA Neck W/WO contrast  Result Date: 01/28/2019 CLINICAL DATA:   Stroke.  Slurred speech.  Left facial droop. EXAM: CT ANGIOGRAPHY HEAD AND NECK CT PERFUSION BRAIN TECHNIQUE: Multidetector CT imaging of the head and neck was performed using the standard protocol during bolus administration of intravenous contrast. Multiplanar CT image reconstructions and MIPs were obtained to evaluate the vascular anatomy. Carotid stenosis measurements (when applicable) are obtained utilizing NASCET criteria, using the distal internal carotid diameter as the denominator. Multiphase CT imaging of the brain was performed following IV bolus contrast injection. Subsequent parametric perfusion maps were calculated using RAPID software. CONTRAST:  OMNIPAQUE IOHEXOL 350 MG/ML SOLN COMPARISON:  CT head 01/28/2019 FINDINGS: CTA NECK FINDINGS Aortic arch: Proximal great vessels widely patent. Incomplete imaging of the aortic arch. Right carotid system: Right common carotid artery widely patent. 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 Right internal carotid artery remains occluded through the cavernous segment. Reconstitution of the supraclinoid internal carotid artery on the right. Left carotid system: Left carotid bifurcation widely patent without significant stenosis. Vertebral arteries: Both vertebral arteries are patent to the basilar without significant stenosis. Skeleton: No acute skeletal abnormality.  Poor dentition. Other neck: Thyroid goiter.  No mass lesion. Upper chest: Mild pleural thickening along the major fissure on the right. Otherwise lungs clear. Review of the MIP images confirms the above findings CTA HEAD FINDINGS Anterior circulation: Right internal carotid artery is occluded through the cavernous segment with reconstitution of the supraclinoid segment. Both anterior and middle cerebral arteries are patent without significant stenosis or thrombus. Right anterior and middle cerebral arteries supplied through the anterior communicating artery. No significant  posterior communicating artery identified on the right. Left cavernous carotid widely patent. Posterior circulation: Both vertebral arteries patent  to the basilar. PICA patent. Basilar widely patent. AICA, superior cerebellar, posterior cerebral arteries patent bilaterally. Venous sinuses: Minimal venous contrast due to arterial phase scanning Anatomic variants: None Review of the MIP images confirms the above findings CT Brain Perfusion Findings: ASPECTS: 10 CBF (<30%) Volume: 13mL Perfusion (Tmax>6.0s) volume: Mismatch Volume: Infarction Location:Right MCA territory shows diffuse delayed perfusion. Core infarct in the right watershed territory in the deep white matter. IMPRESSION: 1. 13 mL core infarct in the watershed territory on the right. Large area of delayed perfusion right MCA territory due to occlusion of the right internal carotid artery. Right anterior middle cerebral arteries are patent and supplied through the anterior communicating artery. Collateral circulation is most likely the reason for delayed perfusion in the right MCA territory. 2. No other significant intracranial stenosis. 3. These results were called by telephone at the time of interpretation on 01/28/2019 at 9:54 pm to provider [facility] , who verbally acknowledged these results. Electronically Signed   By: Marlan Palau M.D.   On: 01/28/2019 21:56   MR MRA HEAD WO CONTRAST  Result Date: 01/29/2019 CLINICAL DATA:  Stroke follow-up. Left-sided weakness. Status post endovascular revascularization of occluded right ICA. EXAM: MRI HEAD WITHOUT CONTRAST MRA HEAD WITHOUT CONTRAST TECHNIQUE: Multiplanar, multiecho pulse sequences of the brain and surrounding structures were obtained without intravenous contrast. Angiographic images of the head were obtained using MRA technique without contrast. COMPARISON:  Head CT, CTA, and CTP 01/28/2019 FINDINGS: MRI HEAD FINDINGS The study is motion degraded throughout with some sequences  being severely degraded (including nondiagnostic susceptibility weighted imaging). Brain: There are scattered small acute to early subacute infarcts in the right cerebral hemisphere most notably involving frontoparietal cortex in the perirolandic region. Small infarcts are also present more inferiorly in the right parietal lobe and in the right globus pallidus, and there is a single punctate subcortical infarct in the left parietal lobe. There are small acute to early subacute bilateral cerebellar infarcts as well. Motion artifact limits assessment for intracranial hemorrhage. There is no midline shift or sizable extra-axial fluid collection. No age advanced chronic white matter disease is evident. Cerebral atrophy is not greater than expected for age. Vascular: Major intracranial vascular flow voids are grossly preserved. Skull and upper cervical spine: No gross marrow lesion. Sinuses/Orbits: Unremarkable orbits. Left maxillary sinusitis. Left anterior ethmoid air cell opacification. No significant mastoid fluid. Other: None. MRA HEAD FINDINGS The study is intermittently severely motion degraded limiting detailed evaluation. The visualized distal vertebral arteries are patent to the basilar and codominant. Cerebellar arteries are not well seen due to motion. The basilar artery is patent with focal severe motion artifact through its midportion and no evidence of significant stenosis elsewhere. Both PCAs are patent proximally. The included distal cervical and intracranial portions of both internal carotid arteries are patent with assessment for stenosis limited by motion. A1 and M1 segments are patent bilaterally with limited assessment of the branch vessels. IMPRESSION: 1. Severely motion degraded examination. 2. Scattered small acute to early subacute infarcts in the cerebrum and cerebellum bilaterally, greatest in the right frontoparietal region. 3. Limited head MRA due to motion with patency of the large  intracranial arteries as above. Electronically Signed   By: Sebastian Ache M.D.   On: 01/29/2019 14:04   MR BRAIN WO CONTRAST  Result Date: 01/29/2019 CLINICAL DATA:  Stroke follow-up. Left-sided weakness. Status post endovascular revascularization of occluded right ICA. EXAM: MRI HEAD WITHOUT CONTRAST MRA HEAD WITHOUT CONTRAST TECHNIQUE: Multiplanar, multiecho  pulse sequences of the brain and surrounding structures were obtained without intravenous contrast. Angiographic images of the head were obtained using MRA technique without contrast. COMPARISON:  Head CT, CTA, and CTP 01/28/2019 FINDINGS: MRI HEAD FINDINGS The study is motion degraded throughout with some sequences being severely degraded (including nondiagnostic susceptibility weighted imaging). Brain: There are scattered small acute to early subacute infarcts in the right cerebral hemisphere most notably involving frontoparietal cortex in the perirolandic region. Small infarcts are also present more inferiorly in the right parietal lobe and in the right globus pallidus, and there is a single punctate subcortical infarct in the left parietal lobe. There are small acute to early subacute bilateral cerebellar infarcts as well. Motion artifact limits assessment for intracranial hemorrhage. There is no midline shift or sizable extra-axial fluid collection. No age advanced chronic white matter disease is evident. Cerebral atrophy is not greater than expected for age. Vascular: Major intracranial vascular flow voids are grossly preserved. Skull and upper cervical spine: No gross marrow lesion. Sinuses/Orbits: Unremarkable orbits. Left maxillary sinusitis. Left anterior ethmoid air cell opacification. No significant mastoid fluid. Other: None. MRA HEAD FINDINGS The study is intermittently severely motion degraded limiting detailed evaluation. The visualized distal vertebral arteries are patent to the basilar and codominant. Cerebellar arteries are not well seen  due to motion. The basilar artery is patent with focal severe motion artifact through its midportion and no evidence of significant stenosis elsewhere. Both PCAs are patent proximally. The included distal cervical and intracranial portions of both internal carotid arteries are patent with assessment for stenosis limited by motion. A1 and M1 segments are patent bilaterally with limited assessment of the branch vessels. IMPRESSION: 1. Severely motion degraded examination. 2. Scattered small acute to early subacute infarcts in the cerebrum and cerebellum bilaterally, greatest in the right frontoparietal region. 3. Limited head MRA due to motion with patency of the large intracranial arteries as above. Electronically Signed   By: Logan Bores M.D.   On: 01/29/2019 14:04   CT C-SPINE NO CHARGE  Result Date: 01/28/2019 CLINICAL DATA:  Trauma.  Rule out spine fracture EXAM: CT CERVICAL SPINE WITHOUT CONTRAST TECHNIQUE: Multidetector CT imaging of the cervical spine was performed without intravenous contrast. Multiplanar CT image reconstructions were also generated. COMPARISON:  None. FINDINGS: Alignment: Normal Skull base and vertebrae: Negative for fracture Soft tissues and spinal canal: Goiter with enlargement of the right lobe of the thyroid. No adenopathy in the neck. Disc levels: Mild disc degeneration and spurring C5-6 with foraminal narrowing bilaterally. Upper chest: Mild thickening of the major fissure on the right. Otherwise lung apices clear. Other: None IMPRESSION: Negative for cervical spine fracture. Electronically Signed   By: Franchot Gallo M.D.   On: 01/28/2019 21:59   CT Code Stroke Cerebral Perfusion with contrast  Result Date: 01/28/2019 CLINICAL DATA:  Stroke.  Slurred speech.  Left facial droop. EXAM: CT ANGIOGRAPHY HEAD AND NECK CT PERFUSION BRAIN TECHNIQUE: Multidetector CT imaging of the head and neck was performed using the standard protocol during bolus administration of intravenous  contrast. Multiplanar CT image reconstructions and MIPs were obtained to evaluate the vascular anatomy. Carotid stenosis measurements (when applicable) are obtained utilizing NASCET criteria, using the distal internal carotid diameter as the denominator. Multiphase CT imaging of the brain was performed following IV bolus contrast injection. Subsequent parametric perfusion maps were calculated using RAPID software. CONTRAST:  154mL OMNIPAQUE IOHEXOL 350 MG/ML SOLN COMPARISON:  CT head 01/28/2019 FINDINGS: CTA NECK FINDINGS Aortic arch: Proximal great  vessels widely patent. Incomplete imaging of the aortic arch. Right carotid system: Right common carotid artery widely patent. 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 Right internal carotid artery remains occluded through the cavernous segment. Reconstitution of the supraclinoid internal carotid artery on the right. Left carotid system: Left carotid bifurcation widely patent without significant stenosis. Vertebral arteries: Both vertebral arteries are patent to the basilar without significant stenosis. Skeleton: No acute skeletal abnormality.  Poor dentition. Other neck: Thyroid goiter.  No mass lesion. Upper chest: Mild pleural thickening along the major fissure on the right. Otherwise lungs clear. Review of the MIP images confirms the above findings CTA HEAD FINDINGS Anterior circulation: Right internal carotid artery is occluded through the cavernous segment with reconstitution of the supraclinoid segment. Both anterior and middle cerebral arteries are patent without significant stenosis or thrombus. Right anterior and middle cerebral arteries supplied through the anterior communicating artery. No significant posterior communicating artery identified on the right. Left cavernous carotid widely patent. Posterior circulation: Both vertebral arteries patent to the basilar. PICA patent. Basilar widely patent. AICA, superior cerebellar, posterior cerebral  arteries patent bilaterally. Venous sinuses: Minimal venous contrast due to arterial phase scanning Anatomic variants: None Review of the MIP images confirms the above findings CT Brain Perfusion Findings: ASPECTS: 10 CBF (<30%) Volume: 13mL Perfusion (Tmax>6.0s) volume: Mismatch Volume: Infarction Location:Right MCA territory shows diffuse delayed perfusion. Core infarct in the right watershed territory in the deep white matter. IMPRESSION: 1. 13 mL core infarct in the watershed territory on the right. Large area of delayed perfusion right MCA territory due to occlusion of the right internal carotid artery. Right anterior middle cerebral arteries are patent and supplied through the anterior communicating artery. Collateral circulation is most likely the reason for delayed perfusion in the right MCA territory. 2. No other significant intracranial stenosis. 3. These results were called by telephone at the time of interpretation on 01/28/2019 at 9:54 pm to provider [facility] , who verbally acknowledged these results. Electronically Signed   By: Marlan Palau M.D.   On: 01/28/2019 21:56   DG CHEST PORT 1 VIEW  Result Date: 01/29/2019 CLINICAL DATA:  Stroke due to embolism EXAM: PORTABLE CHEST 1 VIEW COMPARISON:  01/15/2019 FINDINGS: Interstitial coarsening with Charyl Dancer lines. No visible effusion or pneumothorax. Normal heart size and stable mediastinal contours. Enteric tube tip and side-port reaches the stomach. IMPRESSION: Interstitial pulmonary edema. Electronically Signed   By: Marnee Spring M.D.   On: 01/29/2019 05:08   DG Abd Portable 1V  Result Date: 01/29/2019 CLINICAL DATA:  OG tube placement. EXAM: PORTABLE ABDOMEN - 1 VIEW COMPARISON:  None. FINDINGS: Tip of the enteric tube is below the diaphragm in the stomach, the side port is in the region of the gastroesophageal junction. Recommend advancement of least 3 cm for optimal placement. Excreted IV contrast in both renal collecting  systems and the urinary bladder from prior IV contrast. Lobular contour of the bladder suggests bladder diverticula or chronic bladder outlet obstruction no bowel obstruction. Surgical clips in the right upper quadrant likely from cholecystectomy. IMPRESSION: Tip of the enteric tube below the diaphragm in the stomach, the side-port in the region of the gastroesophageal junction. Recommend advancement of least 3 cm for optimal placement. Electronically Signed   By: Narda Rutherford M.D.   On: 01/29/2019 03:27   ECHOCARDIOGRAM COMPLETE  Result Date: 01/29/2019   ECHOCARDIOGRAM REPORT   Patient Name:   FUTURE YELDELL Date of Exam: 01/29/2019 Medical Rec #:  [id number]       Height:       72.0 in Accession #:    [id number]      Weight:       195.0 lb Date of Birth:  1931-01-17       BSA:          2.11 m Patient Age:    85 years        BP:           120/63 mmHg Patient Gender: M               HR:           71 bpm. Exam Location:  Inpatient Procedure: 2D Echo        STAT ECHO Reported to: Dr. Mayford Knifeurner. Indications:     stroke  History:         Patient has no prior history of Echocardiogram examinations.                  Signs/Symptoms:elevated troponin.  Sonographer:     Delcie RochLauren Pennington Referring Phys:  [phone] TRACI R TURNER Diagnosing Phys: Armanda Magicraci Turner MD IMPRESSIONS  1. Left ventricular ejection fraction, by visual estimation, is <20%. The left ventricle has severely decreased function. Left ventricular septal wall thickness was mildly increased. Mildly increased left ventricular posterior wall thickness.  2. There is apical ballooning with akinesis of the entire apical wall. There is akinesis of the mid infero and anteroseptum. There is akinesis of the mid and apical inferior, anterior and lateral walls and akineseis of the apical inferolateral wall.  3. Moderate, fixed thrombus on the apical wall of the left ventricle.  4. Definity contrast agent was given IV to delineate the left ventricular endocardial borders.   5. Global right ventricle has normal systolic function.The right ventricular size is normal. No increase in right ventricular wall thickness.  6. Left atrial size was normal.  7. Right atrial size was normal.  8. The mitral valve is normal in structure. Trivial mitral valve regurgitation. No evidence of mitral stenosis.  9. The tricuspid valve is normal in structure. Tricuspid valve regurgitation is mild. 10. The aortic valve is tricuspid. Aortic valve regurgitation is not visualized. No evidence of aortic valve sclerosis or stenosis. 11. The pulmonic valve was normal in structure. Pulmonic valve regurgitation is not visualized. 12. Normal pulmonary artery systolic pressure. 13. The inferior vena cava is normal in size with greater than 50% respiratory variability, suggesting right atrial pressure of 3 mmHg. 14. Left ventricular diastolic parameters are consistent with Grade I diastolic dysfunction (impaired relaxation). FINDINGS  Left Ventricle: Left ventricular ejection fraction, by visual estimation, is <20%. The left ventricle has severely decreased function. Definity contrast agent was given IV to delineate the left ventricular endocardial borders. Mildly increased left ventricular posterior wall thickness. Left ventricular diastolic parameters are consistent with Grade I diastolic dysfunction (impaired relaxation). There is a moderate, fixed, apical left ventricular thrombus. The thrombus appears flat (mural) in shape.  There is apical ballooning with akinesis of the entire apical wall. There is akinesis of the mid infero and anteroseptum. There is akinesis of the mid and apical inferior, anterior and lateral walls and akineseis of the apical inferolateral wall. Right Ventricle: The right ventricular size is normal. No increase in right ventricular wall thickness. Global RV systolic function is has normal systolic function. The tricuspid regurgitant velocity is 2.47 m/s, and with an assumed right atrial  pressure  of 3 mmHg,  the estimated right ventricular systolic pressure is normal at 27.4 mmHg. Left Atrium: Left atrial size was normal in size. Right Atrium: Right atrial size was normal in size Pericardium: There is no evidence of pericardial effusion. Mitral Valve: The mitral valve is normal in structure. Trivial mitral valve regurgitation. No evidence of mitral valve stenosis by observation. Tricuspid Valve: The tricuspid valve is normal in structure. Tricuspid valve regurgitation is mild. Aortic Valve: The aortic valve is tricuspid. . There is moderate thickening and moderate calcification of the aortic valve. Aortic valve regurgitation is not visualized. The aortic valve is structurally normal, with no evidence of sclerosis or stenosis. There is moderate thickening of the aortic valve. There is moderate calcification of the aortic valve. Pulmonic Valve: The pulmonic valve was normal in structure. Pulmonic valve regurgitation is not visualized. Pulmonic regurgitation is not visualized. Aorta: The aortic root, ascending aorta and aortic arch are all structurally normal, with no evidence of dilitation or obstruction. Venous: The inferior vena cava is normal in size with greater than 50% respiratory variability, suggesting right atrial pressure of 3 mmHg. IAS/Shunts: No atrial level shunt detected by color flow Doppler. There is no evidence of a patent foramen ovale. No ventricular septal defect is seen or detected. There is no evidence of an atrial septal defect.  LEFT VENTRICLE PLAX 2D LVIDd:         4.20 cm  Diastology LVIDs:         3.00 cm  LV e' lateral: 13.40 cm/s LV PW:         1.20 cm  LV e' medial:  6.20 cm/s LV IVS:        1.20 cm LVOT diam:     1.90 cm LV SV:         44 ml LV SV Index:   20.45 LVOT Area:     2.84 cm  RIGHT VENTRICLE RV S prime:     13.30 cm/s TAPSE (M-mode): 2.3 cm LEFT ATRIUM             Index       RIGHT ATRIUM           Index LA diam:        3.40 cm 1.61 cm/m  RA Area:     12.10  cm LA Vol (A2C):   56.0 ml 26.57 ml/m RA Volume:   28.80 ml  13.66 ml/m LA Vol (A4C):   33.7 ml 15.99 ml/m LA Biplane Vol: 44.3 ml 21.02 ml/m  AORTIC VALVE LVOT Vmax:   54.80 cm/s LVOT Vmean:  41.400 cm/s LVOT VTI:    0.150 m  AORTA Ao Root diam: 2.80 cm TRICUSPID VALVE TR Peak grad:   24.4 mmHg TR Vmax:        247.00 cm/s  SHUNTS Systemic VTI:  0.15 m Systemic Diam: 1.90 cm  Armanda Magic MD Electronically signed by Armanda Magic MD Signature Date/Time: 01/29/2019/4:31:22 AM    Final (Updated)    CT HEAD CODE STROKE WO CONTRAST  Result Date: 01/28/2019 CLINICAL DATA:  Code stroke. Stroke. Slurred speech left facial droop EXAM: CT HEAD WITHOUT CONTRAST TECHNIQUE: Contiguous axial images were obtained from the base of the skull through the vertex without intravenous contrast. COMPARISON:  CT head 12/11/2017 FINDINGS: Brain: Moderate atrophy unchanged. Negative for hydrocephalus. Negative for acute cortical infarct, hemorrhage, mass. Hypodensity in the central lower pons could represent artifact versus acute infarct. Vascular: Negative for hyperdense vessel Skull: Negative Sinuses/Orbits: Chronic opacification left maxillary sinus  with bony thickening unchanged. Mucosal edema left frontal and ethmoid sinus. Negative orbit. Other: None ASPECTS ([place]berta Stroke Program Early CT Score) - Ganglionic level infarction (caudate, lentiform nuclei, internal capsule, insula, M1-M3 cortex): 7 - Supraganglionic infarction (M4-M6 cortex): 3 Total score (0-10 with 10 being normal): 10 IMPRESSION: 1. No acute cortical infarct or hemorrhage. 2. Hypodensity in the pons could be artifact. 3. ASPECTS is 10 4. These results were called by telephone at the time of interpretation on 01/28/2019 at 9:36 pm to provider [facility] , who verbally acknowledged these results. Electronically Signed   By: Marlan Palau M.D.   On: 01/28/2019 21:37   VAS US CAROTID  Result Date: 01/29/2019 Carotid Arterial Duplex Study Indications:        Post thrombectomy. Comparison Study:  No prior study. Performing Technologist: Gertie Fey MHA, RDMS, RVT, RDCS  Examination Guidelines: A complete evaluation includes B-mode imaging, spectral Doppler, color Doppler, and power Doppler as needed of all accessible portions of each vessel. Bilateral testing is considered an integral part of a complete examination. Limited examinations for reoccurring indications may be performed as noted.  Right Carotid Findings: +----------+--------+--------+--------+-----------------------+--------+           PSV cm/sEDV cm/sStenosisPlaque Description     Comments +----------+--------+--------+--------+-----------------------+--------+ CCA Prox  60      16                                              +----------+--------+--------+--------+-----------------------+--------+ CCA Distal52      12              smooth and heterogenous         +----------+--------+--------+--------+-----------------------+--------+ ICA Prox  49      11              smooth and heterogenous         +----------+--------+--------+--------+-----------------------+--------+ ICA Distal90      28                                              +----------+--------+--------+--------+-----------------------+--------+ ECA       60      11                                              +----------+--------+--------+--------+-----------------------+--------+ +---------+--------+--+--------+--+---------+ VertebralPSV cm/s49EDV cm/s13Antegrade +---------+--------+--+--------+--+---------+  Summary: Right Carotid: Velocities in the right ICA are consistent with a 1-39% stenosis.  *See table(s) above for measurements and observations.    Preliminary    [place] EKG SITE RITE  Result Date: 01/29/2019 If Site Rite image not attached, placement could not be confirmed due to current cardiac rhythm.   Labs:  CBC: Recent Labs    01/28/19 2120 01/29/19 0240  01/29/19 1336 01/29/19 1355 01/30/19 0334  WBC 15.0* 17.4*  --  18.6* 15.5*  HGB 12.9* 11.8* 11.2* 11.3* 10.6*  HCT 38.8* 35.8* 33.0* 33.2* 31.6*  PLT 359 406*  --  428* 358    COAGS: No results for input(s): INR, APTT in the last 8760 hours.  BMP: Recent Labs    01/15/19 1845 01/28/19 2120 01/28/19 2126 01/29/19 0240 01/29/19 1336  01/30/19 0334  NA 137 133* 134* 130* 137 137  K 3.5 4.6 4.6 4.0 3.8 4.1  CL 101 100 98 103  --  109  CO2 23 18*  --  12*  --  11*  GLUCOSE 171* 225* 224* 285*  --  174*  BUN 14 54* 68* 47*  --  47*  CALCIUM 8.9 8.1*  --  7.4*  --  8.1*  CREATININE 1.09 2.32* 2.20* 1.94*  --  1.91*  GFRNONAA >60 25*  --  30*  --  31*  GFRAA >60 28*  --  35*  --  36*    LIVER FUNCTION TESTS: Recent Labs    01/15/19 1845 01/28/19 2120 01/29/19 0240  BILITOT 0.7 1.6* 1.6*  AST 24 67* 68*  ALT 19 49* 50*  ALKPHOS 99 133* 146*  PROT 6.9 6.0* 5.4*  ALBUMIN 3.7 2.3* 2.0*    Assessment and Plan:  History of acute CVA s/p emergent cerebral arteriogram 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 Corliss Skains. Patient's condition improving- is a tad confused today (oriented to person only), mild left facial droop, spontaneously moves all extremities. Right groin incision stable, distal pulses 1+ bilaterally. Further plans per neurology/cardiology- appreciate and agree with management. Please call NIR with questions/concerns.   Electronically Signed: Elwin Mocha, PA-C 01/30/2019, 11:21 AM   I spent a total of 25 Minutes at the the patient's bedside AND on the patient's hospital floor or unit, greater than 50% of which was counseling/coordinating care for right ICA occlusion s/p revascularization.

## 2019-01-30 NOTE — Progress Notes (Signed)
eLink Physician-Brief Progress Note Patient Name: Adam Leon DOB: August 14, 1930 MRN: [id number]   Date of Service  01/30/2019  HPI/Events of Note  AFIB with RVR - HR = 120's. BMP sent and pending. Now off of a Norepinephrine IV infusion. BP = 105/50 with MAP = 67 by A-line.  eICU Interventions  Will order: 1. Amiodarone IV bolus and infusion. 2. Await K+ and Mg++ results.      Intervention Category Major Interventions: Arrhythmia - evaluation and management  Ima Hafner [place] 01/30/2019, 5:22 AM

## 2019-01-30 NOTE — Consult Note (Signed)
Physical Medicine and Rehabilitation Consult Reason for Consult: Left side weakness Referring Physician: Dr.Xu   HPI: Adam Leon is a 85 y.o. right-handed male with history of hyperlipidemia, hypertension and syncope. History taken from chart review and daughter due to cognitive state.  Per chart review patient lives with spouse.  Two-level home bed and bathroom upstairs.  Patient was requiring some assistance with mobility.  He was independent with ADLs.  Family was reporting some possible cognitive deficits.  Presented 01/28/2019 with left-sided weakness and a fall.  Head CT unremarkable for acute intracranial process Patient did not receive TPA.  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  MRA was unremarkable.  Carotid Dopplers with no ICA stenosis.  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  Follow-up per cardiology services initially maintained on IV Lasix for acute systolic heart failure.  Currently remains on intravenous heparin for anticoagulation.  Bouts of atrial fibrillation elevated heart rate currently on intravenous amiodarone.  Hospital course further complicated by post stroke dysphagia, currently on a dysphagia #2 thin liquid diet.  He is maintained on heparin GGT.  Therapy evaluations completed with recommendations of physical medicine rehab consult.  Review of Systems  Unable to perform ROS: Mental acuity   Past Medical History:  Diagnosis Date  . Hypercholesteremia   . Syncope   . Syncope   . Ventricular hypertrophy    History reviewed. No pertinent surgical history, unable to obtain from patient. No family history on file., unable to obtain from patient.  Social History:  reports that he has never smoked. He has never used smokeless tobacco. No history on file for alcohol and drug. Allergies: No Known  Allergies Medications Prior to Admission  Medication Sig Dispense Refill  . aspirin 81 MG tablet Take 81 mg by mouth daily.    Marland Kitchen atenolol (TENORMIN) 25 MG tablet Take 25 mg by mouth daily.    Marland Kitchen lisinopril (PRINIVIL,ZESTRIL) 5 MG tablet Take 5 mg by mouth daily.    . metFORMIN (GLUCOPHAGE-XR) 500 MG 24 hr tablet Take 500 mg by mouth 2 (two) times daily.    . pravastatin (PRAVACHOL) 40 MG tablet Take 40 mg by mouth daily.      Home: Home Living Family/patient expects to be discharged to:: Private residence Living Arrangements: Spouse/significant other Available Help at Discharge: Family, Available PRN/intermittently Type of Home: House Home Access: Stairs to enter Secretary/administrator of Steps: 2 flights Home Layout: Two level, Bed/bath upstairs Alternate Level Stairs-Number of Steps: full flight Bathroom Shower/Tub: Health visitor: Standard Home Equipment: Medical laboratory scientific officer - single point Additional Comments: children are nearby; info from daughter in room  Functional History: Prior Function Level of Independence: Needs assistance Gait / Transfers Assistance Needed: last 2-3 weeks, pt was getting worse with mobility and cognition ADL's / Homemaking Assistance Needed: Pt driving, pt's family reports independence with ADL Comments: Info from daughter as pt talking out of head Functional Status:  Mobility: Bed Mobility Overal bed mobility: Needs Assistance Bed Mobility: Supine to Sit Rolling: Mod assist Supine to sit: Mod assist, +2 for physical assistance, +2 for safety/equipment General bed mobility comments: modA +2 for initiating movements and B LE management; pt able to lift his trunk once feet off bed Transfers Overall transfer level: Needs assistance Equipment used: Rolling walker (2 wheeled) Transfers: Sit to/from Stand Sit to Stand: Min  assist, +2 physical assistance, +2 safety/equipment General transfer comment: assist for stability and to reduce  impulsivity Ambulation/Gait Ambulation/Gait assistance: Min assist, +2 safety/equipment Gait Distance (Feet): 75 Feet Assistive device: Rolling walker (2 wheeled) Gait Pattern/deviations: Shuffle, Decreased stride length, Step-through pattern General Gait Details: pt able to ambulate into the hall with min assist +2 for safety/chair follow; pt veered to left and required max cueing for redirection of walker Gait velocity: decreased    ADL: ADL Overall ADL's : Needs assistance/impaired Eating/Feeding: NPO Grooming: Min guard, Minimal assistance, Standing, Cueing for safety, Cueing for sequencing Upper Body Bathing: Minimal assistance, Sitting Lower Body Bathing: Moderate assistance, Sitting/lateral leans, Sit to/from stand, Cueing for safety, Cueing for sequencing Upper Body Dressing : Minimal assistance, Sitting, Cueing for safety Lower Body Dressing: Moderate assistance, Cueing for safety, Cueing for sequencing, Sitting/lateral leans, Sit to/from stand Toilet Transfer: Minimal assistance, Stand-pivot, BSC Toileting- Clothing Manipulation and Hygiene: Moderate assistance, Cueing for safety, Cueing for sequencing, Sitting/lateral lean Functional mobility during ADLs: Minimal assistance, +2 for safety/equipment, +2 for physical assistance, Cueing for safety, Cueing for sequencing General ADL Comments: Pt limited by decreased strength, decreased ADL task participation and decreased cognition.  Cognition: Cognition Overall Cognitive Status: Impaired/Different from baseline Arousal/Alertness: Awake/alert Orientation Level: Oriented to person, Disoriented to place, Disoriented to situation, Disoriented to time Attention: Sustained Sustained Attention: Appears intact(for simple, functional tasks) Memory: Impaired Memory Impairment: Decreased recall of new information Awareness: Impaired Awareness Impairment: Intellectual impairment, Emergent impairment, Anticipatory impairment Problem  Solving: Impaired Problem Solving Impairment: Verbal basic Safety/Judgment: Impaired Cognition Arousal/Alertness: Awake/alert Behavior During Therapy: Flat affect Overall Cognitive Status: Impaired/Different from baseline Area of Impairment: Orientation, Attention, Memory, Following commands, Safety/judgement, Awareness, Problem solving Orientation Level: Disoriented to, Place, Time, Situation Current Attention Level: Sustained Memory: Decreased short-term memory Following Commands: Follows one step commands inconsistently Safety/Judgement: Decreased awareness of safety, Decreased awareness of deficits Awareness: Intellectual Problem Solving: Slow processing, Decreased initiation, Difficulty sequencing, Requires verbal cues, Requires tactile cues General Comments: Pt pleasantly confused throughout  Blood pressure (!) 92/59, pulse 84, temperature (!) 97.2 F (36.2 C), temperature source Axillary, resp. rate (!) 22, height 6' (1.829 m), weight 88.5 kg, SpO2 94 %. Physical Exam  Vitals reviewed. Constitutional: He appears well-developed and well-nourished.  HENT:  Head: Normocephalic and atraumatic.  Eyes: EOM are normal. Right eye exhibits no discharge. Left eye exhibits no discharge.  Neck: No tracheal deviation present. No thyromegaly present.  Respiratory: Effort normal. No stridor. No respiratory distress.  GI: He exhibits no distension.  Musculoskeletal:     Comments: No edema or tenderness in extremities  Neurological: He is alert.  Oriented x1 Not following commands, however spontaneously moving all extremities, right more freely than left.  Skin: Skin is warm and dry.  Psychiatric: His speech is tangential. He is actively hallucinating. Cognition and memory are impaired.    Results for orders placed or performed during the hospital encounter of 01/28/19 (from the past 24 hour(s))  CBC with Differential/Platelet     Status: Abnormal   Collection Time: 01/29/19  1:55 PM   Result Value Ref Range   WBC 18.6 (H) 4.0 - 10.5 K/uL   RBC 4.08 (L) 4.22 - 5.81 MIL/uL   Hemoglobin 11.3 (L) 13.0 - 17.0 g/dL   HCT 03.5 (L) 00.9 - 38.1 %   MCV 81.4 80.0 - 100.0 fL   MCH 27.7 26.0 - 34.0 pg   MCHC 34.0 30.0 - 36.0 g/dL   RDW 82.9 93.7 - 16.9 %  Platelets 428 (H) 150 - 400 K/uL   nRBC 0.0 0.0 - 0.2 %   Neutrophils Relative % 79 %   Neutro Abs 14.8 (H) 1.7 - 7.7 K/uL   Lymphocytes Relative 7 %   Lymphs Abs 1.3 0.7 - 4.0 K/uL   Monocytes Relative 12 %   Monocytes Absolute 2.2 (H) 0.1 - 1.0 K/uL   Eosinophils Relative 0 %   Eosinophils Absolute 0.0 0.0 - 0.5 K/uL   Basophils Relative 0 %   Basophils Absolute 0.0 0.0 - 0.1 K/uL   Immature Granulocytes 2 %   Abs Immature Granulocytes 0.32 (H) 0.00 - 0.07 K/uL  Heparin level (unfractionated)     Status: Abnormal   Collection Time: 01/29/19  2:14 PM  Result Value Ref Range   Heparin Unfractionated <0.10 (L) 0.30 - 0.70 IU/mL  Lactic acid, plasma     Status: Abnormal   Collection Time: 01/29/19  3:54 PM  Result Value Ref Range   Lactic Acid, Venous 2.6 (HH) 0.5 - 1.9 mmol/L  Glucose, capillary     Status: Abnormal   Collection Time: 01/29/19  5:12 PM  Result Value Ref Range   Glucose-Capillary 129 (H) 70 - 99 mg/dL  Glucose, capillary     Status: None   Collection Time: 01/29/19  7:26 PM  Result Value Ref Range   Glucose-Capillary 96 70 - 99 mg/dL  Glucose, capillary     Status: Abnormal   Collection Time: 01/29/19 11:18 PM  Result Value Ref Range   Glucose-Capillary 118 (H) 70 - 99 mg/dL  Glucose, capillary     Status: Abnormal   Collection Time: 01/30/19  3:22 AM  Result Value Ref Range   Glucose-Capillary 140 (H) 70 - 99 mg/dL  Heparin level (unfractionated)     Status: None   Collection Time: 01/30/19  3:34 AM  Result Value Ref Range   Heparin Unfractionated 0.48 0.30 - 0.70 IU/mL  CBC9     Status: Abnormal   Collection Time: 01/30/19  3:34 AM  Result Value Ref Range   WBC 15.5 (H) 4.0 - 10.5  K/uL   RBC 3.80 (L) 4.22 - 5.81 MIL/uL   Hemoglobin 10.6 (L) 13.0 - 17.0 g/dL   HCT 16.1 (L) 09.6 - 04.5 %   MCV 83.2 80.0 - 100.0 fL   MCH 27.9 26.0 - 34.0 pg   MCHC 33.5 30.0 - 36.0 g/dL   RDW 40.9 81.1 - 91.4 %   Platelets 358 150 - 400 K/uL   nRBC 0.0 0.0 - 0.2 %  Basic metabolic panel     Status: Abnormal   Collection Time: 01/30/19  3:34 AM  Result Value Ref Range   Sodium 137 135 - 145 mmol/L   Potassium 4.1 3.5 - 5.1 mmol/L   Chloride 109 98 - 111 mmol/L   CO2 11 (L) 22 - 32 mmol/L   Glucose, Bld 174 (H) 70 - 99 mg/dL   BUN 47 (H) 8 - 23 mg/dL   Creatinine, Ser 7.82 (H) 0.61 - 1.24 mg/dL   Calcium 8.1 (L) 8.9 - 10.3 mg/dL   GFR calc non Af Amer 31 (L) >60 mL/min   GFR calc Af Amer 36 (L) >60 mL/min   Anion gap 17 (H) 5 - 15  Lactic acid, plasma     Status: Abnormal   Collection Time: 01/30/19  3:34 AM  Result Value Ref Range   Lactic Acid, Venous 2.1 (HH) 0.5 - 1.9 mmol/L  Glucose, capillary  Status: Abnormal   Collection Time: 01/30/19  7:51 AM  Result Value Ref Range   Glucose-Capillary 162 (H) 70 - 99 mg/dL   Comment 1 Notify RN    Comment 2 Document in Chart   Magnesium     Status: None   Collection Time: 01/30/19 11:25 AM  Result Value Ref Range   Magnesium 2.0 1.7 - 2.4 mg/dL  Heparin level (unfractionated)     Status: None   Collection Time: 01/30/19 11:25 AM  Result Value Ref Range   Heparin Unfractionated 0.30 0.30 - 0.70 IU/mL  LDL cholesterol, direct     Status: None   Collection Time: 01/30/19 11:25 AM  Result Value Ref Range   Direct LDL 58.5 0 - 99 mg/dL  Glucose, capillary     Status: Abnormal   Collection Time: 01/30/19 12:07 PM  Result Value Ref Range   Glucose-Capillary 174 (H) 70 - 99 mg/dL   Comment 1 Notify RN    Comment 2 Document in Chart    CT Code Stroke CTA Head W/WO contrast  Result Date: 01/28/2019 CLINICAL DATA:  Stroke.  Slurred speech.  Left facial droop. EXAM: CT ANGIOGRAPHY HEAD AND NECK CT PERFUSION BRAIN TECHNIQUE:  Multidetector CT imaging of the head and neck was performed using the standard protocol during bolus administration of intravenous contrast. Multiplanar CT image reconstructions and MIPs were obtained to evaluate the vascular anatomy. Carotid stenosis measurements (when applicable) are obtained utilizing NASCET criteria, using the distal internal carotid diameter as the denominator. Multiphase CT imaging of the brain was performed following IV bolus contrast injection. Subsequent parametric perfusion maps were calculated using RAPID software. CONTRAST:  OMNIPAQUE IOHEXOL 350 MG/ML SOLN COMPARISON:  CT head 01/28/2019 FINDINGS: CTA NECK FINDINGS Aortic arch: Proximal great vessels widely patent. Incomplete imaging of the aortic arch. Right carotid system: Right common carotid artery widely patent. 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 Right internal carotid artery remains occluded through the cavernous segment. Reconstitution of the supraclinoid internal carotid artery on the right. Left carotid system: Left carotid bifurcation widely patent without significant stenosis. Vertebral arteries: Both vertebral arteries are patent to the basilar without significant stenosis. Skeleton: No acute skeletal abnormality.  Poor dentition. Other neck: Thyroid goiter.  No mass lesion. Upper chest: Mild pleural thickening along the major fissure on the right. Otherwise lungs clear. Review of the MIP images confirms the above findings CTA HEAD FINDINGS Anterior circulation: Right internal carotid artery is occluded through the cavernous segment with reconstitution of the supraclinoid segment. Both anterior and middle cerebral arteries are patent without significant stenosis or thrombus. Right anterior and middle cerebral arteries supplied through the anterior communicating artery. No significant posterior communicating artery identified on the right. Left cavernous carotid widely patent. Posterior circulation:  Both vertebral arteries patent to the basilar. PICA patent. Basilar widely patent. AICA, superior cerebellar, posterior cerebral arteries patent bilaterally. Venous sinuses: Minimal venous contrast due to arterial phase scanning Anatomic variants: None Review of the MIP images confirms the above findings CT Brain Perfusion Findings: ASPECTS: 10 CBF (<30%) Volume: 13mL Perfusion (Tmax>6.0s) volume: Mismatch Volume: Infarction Location:Right MCA territory shows diffuse delayed perfusion. Core infarct in the right watershed territory in the deep white matter. IMPRESSION: 1. 13 mL core infarct in the watershed territory on the right. Large area of delayed perfusion right MCA territory due to occlusion of the right internal carotid artery. Right anterior middle cerebral arteries are patent and supplied through the anterior communicating artery. Collateral  circulation is most likely the reason for delayed perfusion in the right MCA territory. 2. No other significant intracranial stenosis. 3. These results were called by telephone at the time of interpretation on 01/28/2019 at 9:54 pm to provider [facility] , who verbally acknowledged these results. Electronically Signed   By: Marlan Palau M.D.   On: 01/28/2019 21:56   CT Code Stroke CTA Neck W/WO contrast  Result Date: 01/28/2019 CLINICAL DATA:  Stroke.  Slurred speech.  Left facial droop. EXAM: CT ANGIOGRAPHY HEAD AND NECK CT PERFUSION BRAIN TECHNIQUE: Multidetector CT imaging of the head and neck was performed using the standard protocol during bolus administration of intravenous contrast. Multiplanar CT image reconstructions and MIPs were obtained to evaluate the vascular anatomy. Carotid stenosis measurements (when applicable) are obtained utilizing NASCET criteria, using the distal internal carotid diameter as the denominator. Multiphase CT imaging of the brain was performed following IV bolus contrast injection. Subsequent parametric perfusion  maps were calculated using RAPID software. CONTRAST:  OMNIPAQUE IOHEXOL 350 MG/ML SOLN COMPARISON:  CT head 01/28/2019 FINDINGS: CTA NECK FINDINGS Aortic arch: Proximal great vessels widely patent. Incomplete imaging of the aortic arch. Right carotid system: Right common carotid artery widely patent. 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 Right internal carotid artery remains occluded through the cavernous segment. Reconstitution of the supraclinoid internal carotid artery on the right. Left carotid system: Left carotid bifurcation widely patent without significant stenosis. Vertebral arteries: Both vertebral arteries are patent to the basilar without significant stenosis. Skeleton: No acute skeletal abnormality.  Poor dentition. Other neck: Thyroid goiter.  No mass lesion. Upper chest: Mild pleural thickening along the major fissure on the right. Otherwise lungs clear. Review of the MIP images confirms the above findings CTA HEAD FINDINGS Anterior circulation: Right internal carotid artery is occluded through the cavernous segment with reconstitution of the supraclinoid segment. Both anterior and middle cerebral arteries are patent without significant stenosis or thrombus. Right anterior and middle cerebral arteries supplied through the anterior communicating artery. No significant posterior communicating artery identified on the right. Left cavernous carotid widely patent. Posterior circulation: Both vertebral arteries patent to the basilar. PICA patent. Basilar widely patent. AICA, superior cerebellar, posterior cerebral arteries patent bilaterally. Venous sinuses: Minimal venous contrast due to arterial phase scanning Anatomic variants: None Review of the MIP images confirms the above findings CT Brain Perfusion Findings: ASPECTS: 10 CBF (<30%) Volume: 13mL Perfusion (Tmax>6.0s) volume: Mismatch Volume: Infarction Location:Right MCA territory shows diffuse delayed perfusion. Core  infarct in the right watershed territory in the deep white matter. IMPRESSION: 1. 13 mL core infarct in the watershed territory on the right. Large area of delayed perfusion right MCA territory due to occlusion of the right internal carotid artery. Right anterior middle cerebral arteries are patent and supplied through the anterior communicating artery. Collateral circulation is most likely the reason for delayed perfusion in the right MCA territory. 2. No other significant intracranial stenosis. 3. These results were called by telephone at the time of interpretation on 01/28/2019 at 9:54 pm to provider [facility] , who verbally acknowledged these results. Electronically Signed   By: Marlan Palau M.D.   On: 01/28/2019 21:56   MR MRA HEAD WO CONTRAST  Result Date: 01/29/2019 CLINICAL DATA:  Stroke follow-up. Left-sided weakness. Status post endovascular revascularization of occluded right ICA. EXAM: MRI HEAD WITHOUT CONTRAST MRA HEAD WITHOUT CONTRAST TECHNIQUE: Multiplanar, multiecho pulse sequences of the brain and surrounding structures were obtained without intravenous contrast. Angiographic  images of the head were obtained using MRA technique without contrast. COMPARISON:  Head CT, CTA, and CTP 01/28/2019 FINDINGS: MRI HEAD FINDINGS The study is motion degraded throughout with some sequences being severely degraded (including nondiagnostic susceptibility weighted imaging). Brain: There are scattered small acute to early subacute infarcts in the right cerebral hemisphere most notably involving frontoparietal cortex in the perirolandic region. Small infarcts are also present more inferiorly in the right parietal lobe and in the right globus pallidus, and there is a single punctate subcortical infarct in the left parietal lobe. There are small acute to early subacute bilateral cerebellar infarcts as well. Motion artifact limits assessment for intracranial hemorrhage. There is no midline shift or sizable  extra-axial fluid collection. No age advanced chronic white matter disease is evident. Cerebral atrophy is not greater than expected for age. Vascular: Major intracranial vascular flow voids are grossly preserved. Skull and upper cervical spine: No gross marrow lesion. Sinuses/Orbits: Unremarkable orbits. Left maxillary sinusitis. Left anterior ethmoid air cell opacification. No significant mastoid fluid. Other: None. MRA HEAD FINDINGS The study is intermittently severely motion degraded limiting detailed evaluation. The visualized distal vertebral arteries are patent to the basilar and codominant. Cerebellar arteries are not well seen due to motion. The basilar artery is patent with focal severe motion artifact through its midportion and no evidence of significant stenosis elsewhere. Both PCAs are patent proximally. The included distal cervical and intracranial portions of both internal carotid arteries are patent with assessment for stenosis limited by motion. A1 and M1 segments are patent bilaterally with limited assessment of the branch vessels. IMPRESSION: 1. Severely motion degraded examination. 2. Scattered small acute to early subacute infarcts in the cerebrum and cerebellum bilaterally, greatest in the right frontoparietal region. 3. Limited head MRA due to motion with patency of the large intracranial arteries as above. Electronically Signed   By: Sebastian Ache M.D.   On: 01/29/2019 14:04   MR BRAIN WO CONTRAST  Result Date: 01/29/2019 CLINICAL DATA:  Stroke follow-up. Left-sided weakness. Status post endovascular revascularization of occluded right ICA. EXAM: MRI HEAD WITHOUT CONTRAST MRA HEAD WITHOUT CONTRAST TECHNIQUE: Multiplanar, multiecho pulse sequences of the brain and surrounding structures were obtained without intravenous contrast. Angiographic images of the head were obtained using MRA technique without contrast. COMPARISON:  Head CT, CTA, and CTP 01/28/2019 FINDINGS: MRI HEAD FINDINGS The  study is motion degraded throughout with some sequences being severely degraded (including nondiagnostic susceptibility weighted imaging). Brain: There are scattered small acute to early subacute infarcts in the right cerebral hemisphere most notably involving frontoparietal cortex in the perirolandic region. Small infarcts are also present more inferiorly in the right parietal lobe and in the right globus pallidus, and there is a single punctate subcortical infarct in the left parietal lobe. There are small acute to early subacute bilateral cerebellar infarcts as well. Motion artifact limits assessment for intracranial hemorrhage. There is no midline shift or sizable extra-axial fluid collection. No age advanced chronic white matter disease is evident. Cerebral atrophy is not greater than expected for age. Vascular: Major intracranial vascular flow voids are grossly preserved. Skull and upper cervical spine: No gross marrow lesion. Sinuses/Orbits: Unremarkable orbits. Left maxillary sinusitis. Left anterior ethmoid air cell opacification. No significant mastoid fluid. Other: None. MRA HEAD FINDINGS The study is intermittently severely motion degraded limiting detailed evaluation. The visualized distal vertebral arteries are patent to the basilar and codominant. Cerebellar arteries are not well seen due to motion. The basilar artery is patent with focal  severe motion artifact through its midportion and no evidence of significant stenosis elsewhere. Both PCAs are patent proximally. The included distal cervical and intracranial portions of both internal carotid arteries are patent with assessment for stenosis limited by motion. A1 and M1 segments are patent bilaterally with limited assessment of the branch vessels. IMPRESSION: 1. Severely motion degraded examination. 2. Scattered small acute to early subacute infarcts in the cerebrum and cerebellum bilaterally, greatest in the right frontoparietal region. 3. Limited  head MRA due to motion with patency of the large intracranial arteries as above. Electronically Signed   By: Sebastian Ache M.D.   On: 01/29/2019 14:04   CT C-SPINE NO CHARGE  Result Date: 01/28/2019 CLINICAL DATA:  Trauma.  Rule out spine fracture EXAM: CT CERVICAL SPINE WITHOUT CONTRAST TECHNIQUE: Multidetector CT imaging of the cervical spine was performed without intravenous contrast. Multiplanar CT image reconstructions were also generated. COMPARISON:  None. FINDINGS: Alignment: Normal Skull base and vertebrae: Negative for fracture Soft tissues and spinal canal: Goiter with enlargement of the right lobe of the thyroid. No adenopathy in the neck. Disc levels: Mild disc degeneration and spurring C5-6 with foraminal narrowing bilaterally. Upper chest: Mild thickening of the major fissure on the right. Otherwise lung apices clear. Other: None IMPRESSION: Negative for cervical spine fracture. Electronically Signed   By: Marlan Palau M.D.   On: 01/28/2019 21:59   CT Code Stroke Cerebral Perfusion with contrast  Result Date: 01/28/2019 CLINICAL DATA:  Stroke.  Slurred speech.  Left facial droop. EXAM: CT ANGIOGRAPHY HEAD AND NECK CT PERFUSION BRAIN TECHNIQUE: Multidetector CT imaging of the head and neck was performed using the standard protocol during bolus administration of intravenous contrast. Multiplanar CT image reconstructions and MIPs were obtained to evaluate the vascular anatomy. Carotid stenosis measurements (when applicable) are obtained utilizing NASCET criteria, using the distal internal carotid diameter as the denominator. Multiphase CT imaging of the brain was performed following IV bolus contrast injection. Subsequent parametric perfusion maps were calculated using RAPID software. CONTRAST:  OMNIPAQUE IOHEXOL 350 MG/ML SOLN COMPARISON:  CT head 01/28/2019 FINDINGS: CTA NECK FINDINGS Aortic arch: Proximal great vessels widely patent. Incomplete imaging of the aortic arch. Right  carotid system: Right common carotid artery widely patent. 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 Right internal carotid artery remains occluded through the cavernous segment. Reconstitution of the supraclinoid internal carotid artery on the right. Left carotid system: Left carotid bifurcation widely patent without significant stenosis. Vertebral arteries: Both vertebral arteries are patent to the basilar without significant stenosis. Skeleton: No acute skeletal abnormality.  Poor dentition. Other neck: Thyroid goiter.  No mass lesion. Upper chest: Mild pleural thickening along the major fissure on the right. Otherwise lungs clear. Review of the MIP images confirms the above findings CTA HEAD FINDINGS Anterior circulation: Right internal carotid artery is occluded through the cavernous segment with reconstitution of the supraclinoid segment. Both anterior and middle cerebral arteries are patent without significant stenosis or thrombus. Right anterior and middle cerebral arteries supplied through the anterior communicating artery. No significant posterior communicating artery identified on the right. Left cavernous carotid widely patent. Posterior circulation: Both vertebral arteries patent to the basilar. PICA patent. Basilar widely patent. AICA, superior cerebellar, posterior cerebral arteries patent bilaterally. Venous sinuses: Minimal venous contrast due to arterial phase scanning Anatomic variants: None Review of the MIP images confirms the above findings CT Brain Perfusion Findings: ASPECTS: 10 CBF (<30%) Volume: 13mL Perfusion (Tmax>6.0s) volume: Mismatch Volume: Infarction Location:Right MCA  territory shows diffuse delayed perfusion. Core infarct in the right watershed territory in the deep white matter. IMPRESSION: 1. 13 mL core infarct in the watershed territory on the right. Large area of delayed perfusion right MCA territory due to occlusion of the right internal carotid artery.  Right anterior middle cerebral arteries are patent and supplied through the anterior communicating artery. Collateral circulation is most likely the reason for delayed perfusion in the right MCA territory. 2. No other significant intracranial stenosis. 3. These results were called by telephone at the time of interpretation on 01/28/2019 at 9:54 pm to provider [facility] , who verbally acknowledged these results. Electronically Signed   By: Marlan Palau M.D.   On: 01/28/2019 21:56   DG CHEST PORT 1 VIEW  Result Date: 01/29/2019 CLINICAL DATA:  Stroke due to embolism EXAM: PORTABLE CHEST 1 VIEW COMPARISON:  01/15/2019 FINDINGS: Interstitial coarsening with Charyl Dancer lines. No visible effusion or pneumothorax. Normal heart size and stable mediastinal contours. Enteric tube tip and side-port reaches the stomach. IMPRESSION: Interstitial pulmonary edema. Electronically Signed   By: Marnee Spring M.D.   On: 01/29/2019 05:08   DG Abd Portable 1V  Result Date: 01/29/2019 CLINICAL DATA:  OG tube placement. EXAM: PORTABLE ABDOMEN - 1 VIEW COMPARISON:  None. FINDINGS: Tip of the enteric tube is below the diaphragm in the stomach, the side port is in the region of the gastroesophageal junction. Recommend advancement of least 3 cm for optimal placement. Excreted IV contrast in both renal collecting systems and the urinary bladder from prior IV contrast. Lobular contour of the bladder suggests bladder diverticula or chronic bladder outlet obstruction no bowel obstruction. Surgical clips in the right upper quadrant likely from cholecystectomy. IMPRESSION: Tip of the enteric tube below the diaphragm in the stomach, the side-port in the region of the gastroesophageal junction. Recommend advancement of least 3 cm for optimal placement. Electronically Signed   By: Narda Rutherford M.D.   On: 01/29/2019 03:27   ECHOCARDIOGRAM COMPLETE  Result Date: 01/29/2019   ECHOCARDIOGRAM REPORT   Patient Name:   PRABHJOT PISCITELLO  Date of Exam: 01/29/2019 Medical Rec #:  [id number]       Height:       72.0 in Accession #:    [id number]      Weight:       195.0 lb Date of Birth:  Feb 26, 1930       BSA:          2.11 m Patient Age:    88 years        BP:           120/63 mmHg Patient Gender: M               HR:           71 bpm. Exam Location:  Inpatient Procedure: 2D Echo        STAT ECHO Reported to: Dr. Mayford Knife. Indications:     stroke  History:         Patient has no prior history of Echocardiogram examinations.                  Signs/Symptoms:elevated troponin.  Sonographer:     Delcie Roch Referring Phys:  [phone] TRACI R TURNER Diagnosing Phys: Armanda Magic MD IMPRESSIONS  1. Left ventricular ejection fraction, by visual estimation, is <20%. The left ventricle has severely decreased function. Left ventricular septal wall thickness was mildly increased. Mildly increased left ventricular posterior wall thickness.  2. There is apical ballooning with akinesis of the entire apical wall. There is akinesis of the mid infero and anteroseptum. There is akinesis of the mid and apical inferior, anterior and lateral walls and akineseis of the apical inferolateral wall.  3. Moderate, fixed thrombus on the apical wall of the left ventricle.  4. Definity contrast agent was given IV to delineate the left ventricular endocardial borders.  5. Global right ventricle has normal systolic function.The right ventricular size is normal. No increase in right ventricular wall thickness.  6. Left atrial size was normal.  7. Right atrial size was normal.  8. The mitral valve is normal in structure. Trivial mitral valve regurgitation. No evidence of mitral stenosis.  9. The tricuspid valve is normal in structure. Tricuspid valve regurgitation is mild. 10. The aortic valve is tricuspid. Aortic valve regurgitation is not visualized. No evidence of aortic valve sclerosis or stenosis. 11. The pulmonic valve was normal in structure. Pulmonic valve regurgitation is not  visualized. 12. Normal pulmonary artery systolic pressure. 13. The inferior vena cava is normal in size with greater than 50% respiratory variability, suggesting right atrial pressure of 3 mmHg. 14. Left ventricular diastolic parameters are consistent with Grade I diastolic dysfunction (impaired relaxation). FINDINGS  Left Ventricle: Left ventricular ejection fraction, by visual estimation, is <20%. The left ventricle has severely decreased function. Definity contrast agent was given IV to delineate the left ventricular endocardial borders. Mildly increased left ventricular posterior wall thickness. Left ventricular diastolic parameters are consistent with Grade I diastolic dysfunction (impaired relaxation). There is a moderate, fixed, apical left ventricular thrombus. The thrombus appears flat (mural) in shape.  There is apical ballooning with akinesis of the entire apical wall. There is akinesis of the mid infero and anteroseptum. There is akinesis of the mid and apical inferior, anterior and lateral walls and akineseis of the apical inferolateral wall. Right Ventricle: The right ventricular size is normal. No increase in right ventricular wall thickness. Global RV systolic function is has normal systolic function. The tricuspid regurgitant velocity is 2.47 m/s, and with an assumed right atrial pressure  of 3 mmHg, the estimated right ventricular systolic pressure is normal at 27.4 mmHg. Left Atrium: Left atrial size was normal in size. Right Atrium: Right atrial size was normal in size Pericardium: There is no evidence of pericardial effusion. Mitral Valve: The mitral valve is normal in structure. Trivial mitral valve regurgitation. No evidence of mitral valve stenosis by observation. Tricuspid Valve: The tricuspid valve is normal in structure. Tricuspid valve regurgitation is mild. Aortic Valve: The aortic valve is tricuspid. . There is moderate thickening and moderate calcification of the aortic valve. Aortic  valve regurgitation is not visualized. The aortic valve is structurally normal, with no evidence of sclerosis or stenosis. There is moderate thickening of the aortic valve. There is moderate calcification of the aortic valve. Pulmonic Valve: The pulmonic valve was normal in structure. Pulmonic valve regurgitation is not visualized. Pulmonic regurgitation is not visualized. Aorta: The aortic root, ascending aorta and aortic arch are all structurally normal, with no evidence of dilitation or obstruction. Venous: The inferior vena cava is normal in size with greater than 50% respiratory variability, suggesting right atrial pressure of 3 mmHg. IAS/Shunts: No atrial level shunt detected by color flow Doppler. There is no evidence of a patent foramen ovale. No ventricular septal defect is seen or detected. There is no evidence of an atrial septal defect.  LEFT VENTRICLE PLAX 2D LVIDd:  4.20 cm  Diastology LVIDs:         3.00 cm  LV e' lateral: 13.40 cm/s LV PW:         1.20 cm  LV e' medial:  6.20 cm/s LV IVS:        1.20 cm LVOT diam:     1.90 cm LV SV:         44 ml LV SV Index:   20.45 LVOT Area:     2.84 cm  RIGHT VENTRICLE RV S prime:     13.30 cm/s TAPSE (M-mode): 2.3 cm LEFT ATRIUM             Index       RIGHT ATRIUM           Index LA diam:        3.40 cm 1.61 cm/m  RA Area:     12.10 cm LA Vol (A2C):   56.0 ml 26.57 ml/m RA Volume:   28.80 ml  13.66 ml/m LA Vol (A4C):   33.7 ml 15.99 ml/m LA Biplane Vol: 44.3 ml 21.02 ml/m  AORTIC VALVE LVOT Vmax:   54.80 cm/s LVOT Vmean:  41.400 cm/s LVOT VTI:    0.150 m  AORTA Ao Root diam: 2.80 cm TRICUSPID VALVE TR Peak grad:   24.4 mmHg TR Vmax:        247.00 cm/s  SHUNTS Systemic VTI:  0.15 m Systemic Diam: 1.90 cm  Armanda Magic MD Electronically signed by Armanda Magic MD Signature Date/Time: 01/29/2019/4:31:22 AM    Final (Updated)    CT HEAD CODE STROKE WO CONTRAST  Result Date: 01/28/2019 CLINICAL DATA:  Code stroke. Stroke. Slurred speech left facial  droop EXAM: CT HEAD WITHOUT CONTRAST TECHNIQUE: Contiguous axial images were obtained from the base of the skull through the vertex without intravenous contrast. COMPARISON:  CT head 12/11/2017 FINDINGS: Brain: Moderate atrophy unchanged. Negative for hydrocephalus. Negative for acute cortical infarct, hemorrhage, mass. Hypodensity in the central lower pons could represent artifact versus acute infarct. Vascular: Negative for hyperdense vessel Skull: Negative Sinuses/Orbits: Chronic opacification left maxillary sinus with bony thickening unchanged. Mucosal edema left frontal and ethmoid sinus. Negative orbit. Other: None ASPECTS ([place] Stroke Program Early CT Score) - Ganglionic level infarction (caudate, lentiform nuclei, internal capsule, insula, M1-M3 cortex): 7 - Supraganglionic infarction (M4-M6 cortex): 3 Total score (0-10 with 10 being normal): 10 IMPRESSION: 1. No acute cortical infarct or hemorrhage. 2. Hypodensity in the pons could be artifact. 3. ASPECTS is 10 4. These results were called by telephone at the time of interpretation on 01/28/2019 at 9:36 pm to provider [facility] , who verbally acknowledged these results. Electronically Signed   By: Marlan Palau M.D.   On: 01/28/2019 21:37   VAS US CAROTID  Result Date: 01/29/2019 Carotid Arterial Duplex Study Indications:       Post thrombectomy. Comparison Study:  No prior study. Performing Technologist: Gertie Fey MHA, RDMS, RVT, RDCS  Examination Guidelines: A complete evaluation includes B-mode imaging, spectral Doppler, color Doppler, and power Doppler as needed of all accessible portions of each vessel. Bilateral testing is considered an integral part of a complete examination. Limited examinations for reoccurring indications may be performed as noted.  Right Carotid Findings: +----------+--------+--------+--------+-----------------------+--------+           PSV cm/sEDV cm/sStenosisPlaque Description     Comments  +----------+--------+--------+--------+-----------------------+--------+ CCA Prox  60      16                                              +----------+--------+--------+--------+-----------------------+--------+  CCA Distal52      12              smooth and heterogenous         +----------+--------+--------+--------+-----------------------+--------+ ICA Prox  49      11              smooth and heterogenous         +----------+--------+--------+--------+-----------------------+--------+ ICA Distal90      28                                              +----------+--------+--------+--------+-----------------------+--------+ ECA       60      11                                              +----------+--------+--------+--------+-----------------------+--------+ +---------+--------+--+--------+--+---------+ VertebralPSV cm/s49EDV cm/s13Antegrade +---------+--------+--+--------+--+---------+  Summary: Right Carotid: Velocities in the right ICA are consistent with a 1-39% stenosis.  *See table(s) above for measurements and observations.    Preliminary    [place] EKG SITE RITE  Result Date: 01/29/2019 If Site Rite image not attached, placement could not be confirmed due to current cardiac rhythm.   Assessment/Plan: Diagnosis: Bilateral anterior and posterior infarcts likely embolic, s/p revascularization with recent MI Labs independently reviewed.  Records reviewed and summated above.  1. Does the need for close, 24 hr/day medical supervision in concert with the patient's rehab needs make it unreasonable for this patient to be served in a less intensive setting? Yes 2. Co-Morbidities requiring supervision/potential complications: hyperlipidemia, HTN (monitor and provide prns in accordance with increased physical exertion and pain), syncope, new onset atrial fibrillation (continue meds, transition of heparin GGT when appropriate), CAD status post MI (continue meds),  uncontrolled DM with hyperglycemia (Monitor in accordance with exercise and adjust meds as necessary), leukocytosis (repeat labs, cont to monitor for signs and symptoms of infection, further workup if indicated) 3. Due to safety, skin/wound care, disease management, medication administration and patient education, does the patient require 24 hr/day rehab nursing? Yes 4. Does the patient require coordinated care of a physician, rehab nurse, therapy disciplines of PT/OT/SLP to address physical and functional deficits in the context of the above medical diagnosis(es)? Yes Addressing deficits in the following areas: balance, endurance, locomotion, strength, transferring, bathing, dressing, toileting, cognition, language and psychosocial support 5. Can the patient actively participate in an intensive therapy program of at least 3 hrs of therapy per day at least 5 days per week? Potentially 6. The potential for patient to make measurable gains while on inpatient rehab is good 7. Anticipated functional outcomes upon discharge from inpatient rehab are supervision and min assist  with PT, supervision and min assist with OT, min assist with SLP. 8. Estimated rehab length of stay to reach the above functional goals is: 13-17 days. 9. Anticipated discharge destination: TBD. 10. Overall Rehab/Functional Prognosis: good and fair  RECOMMENDATIONS: This patient's condition is appropriate for continued rehabilitative care in the following setting: Will need to monitor progress and ability to tolerate 3 hours of therapies per day given fragility and comorbidities.  At this point, less intense rehab would be more appropriate for this patient, however, will continue to follow.  Patient will also need to have 24/7 caregiver support available  upon discharge. Patient has agreed to participate in recommended program. Potentially Note that insurance prior authorization may be required for reimbursement for recommended  care.  Comment: Rehab Admissions Coordinator to follow up.  I have personally performed a face to face diagnostic evaluation, including, but not limited to relevant history and physical exam findings, of this patient and developed relevant assessment and plan.  Additionally, I have reviewed and concur with the physician assistant's documentation above.   Delice Lesch, MD, ABPMR Lavon Paganini Angiulli, PA-C 01/30/2019

## 2019-01-30 NOTE — Progress Notes (Signed)
Inpatient Rehabilitation Admissions Coordinator  Inpatient rehab consult received. I will follow up with pt's progress on Monday if pt remains in house to assist with planning dispo pending his tolerance and caregiver support.  Danne Baxter, RN, MSN Rehab Admissions Coordinator [phone] 01/30/2019 5:40 PM

## 2019-01-30 NOTE — Evaluation (Signed)
Speech Language Pathology Evaluation Patient Details Name: Adam Leon MRN: [id number] DOB: 1930-04-05 Today's Date: 01/30/2019 Time: 1020-1039 SLP Time Calculation (min) (ACUTE ONLY): 19 min  Problem List:  Patient Active Problem List   Diagnosis Date Noted  . 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 AKI (acute kidney injury) ([place])   . Hypotension due to hypovolemia   . Troponin level elevated   . Acute ischemic stroke ([place]) 01/28/2019   Past Medical History:  Past Medical History:  Diagnosis Date  . Hypercholesteremia   . Syncope   . Syncope   . Ventricular hypertrophy    Past Surgical History: History reviewed. No pertinent surgical history. HPI:  Adam Leon is a 85 y.o. male who has a PMH including but not limited to syncope, HLD.  He [facility] ED 12/16 with left hemiplegia.  CTA showed R ICA occlusion.  He was subsequently taken to IR for arteriogram with complete revascularization of occluded extracranial and intracranial R ICA. CXR 12/17: interstitial edema.  CT 12/16: R MCA infarct in deep white matter   Assessment / Plan / Recommendation Clinical Impression  Pt is alert and communicative with occasional word-finding errors but otherwise mostly fluent. He is oriented to person only and intermittently follows commands; suspect that hearing loss also plays a role. Pt denies hearing loss but does not provide other information accurately and has no awareness of any acute deficits, physical or cognitive. His recall of events this hospitalization is impaired. He will attend to the left side of his environment when cued but favors his right side. He will benefit from SLP f/u to maximize functional independence and safety.     SLP Assessment  SLP Recommendation/Assessment: Patient needs continued Speech Lanaguage Pathology Services SLP Visit Diagnosis: Cognitive communication deficit (R41.841)    Follow Up Recommendations  Inpatient Rehab     Frequency and Duration min 2x/week  2 weeks      SLP Evaluation Cognition  Overall Cognitive Status: Impaired/Different from baseline Arousal/Alertness: Awake/alert Orientation Level: Oriented to person;Disoriented to place;Disoriented to situation;Disoriented to time Attention: Sustained Sustained Attention: Appears intact(for simple, functional tasks) Memory: Impaired Memory Impairment: Decreased recall of new information Awareness: Impaired Awareness Impairment: Intellectual impairment;Emergent impairment;Anticipatory impairment Problem Solving: Impaired Problem Solving Impairment: Verbal basic Safety/Judgment: Impaired       Comprehension  Auditory Comprehension Overall Auditory Comprehension: Impaired Yes/No Questions: Impaired Basic Biographical Questions: 76-100% accurate Basic Immediate Environment Questions: 75-100% accurate Complex Questions: 75-100% accurate Commands: Impaired One Step Basic Commands: 50-74% accurate Conversation: Simple Interfering Components: Hearing    Expression Expression Primary Mode of Expression: Verbal Verbal Expression Overall Verbal Expression: Impaired(occasional word-finding errors)   Oral / Motor  Motor Speech Overall Motor Speech: Impaired(mild, but intelligible)   GO                    Venita Sheffield Gretchen Weinfeld 01/30/2019, 1:19 PM  Pollyann Glen, M.A. [place] Acute Environmental education officer [phone] Office [phone]

## 2019-01-30 NOTE — Progress Notes (Addendum)
ANTICOAGULATION CONSULT NOTE  Pharmacy Consult for Heparin Indication: ACS s/p carotid embolectomy and LV thrombus  No Known Allergies  Patient Measurements: Height: 6' (182.9 cm) Weight: 195 lb (88.5 kg) IBW/kg (Calculated) : 77.6  Vital Signs: Temp: 98.7 F (37.1 C) (12/18 0400) Temp Source: Oral (12/18 0400) BP: 81/54 (12/18 0600) Pulse Rate: 29 (12/18 0215)  Labs: Recent Labs    01/28/19 2120 01/28/19 2120 01/28/19 2126 01/28/19 2313 01/29/19 0240 01/29/19 1107 01/29/19 1336 01/29/19 1355 01/29/19 1414 01/30/19 0334  HGB 12.9*   < > 13.6  --  11.8*  --  11.2* 11.3*  --  10.6*  HCT 38.8*   < > 40.0  --  35.8*  --  33.0* 33.2*  --  31.6*  PLT 359  --   --   --  406*  --   --  428*  --  358  HEPARINUNFRC  --   --   --   --   --   --   --   --  <0.10* 0.48  CREATININE 2.32*  --  2.20*  --  1.94*  --   --   --   --  1.91*  TROPONINIHS 15,519*  --   --  38,182*  --  11,893*  --   --   --   --    < > = values in this interval not displayed.    Estimated Creatinine Clearance: 30.5 mL/min (A) (by C-G formula based on SCr of 1.91 mg/dL (H)).    Assessment: 85 y.o. male with EKG changes, elevated cardiac markers and LV thrombus s/p CVA and carotid thrombectomy continues on IV heparin -heparin level at goal -hg= 10.6, plt= 358  Goal of Therapy:  Heparin level 0.3-0.5 units/mL Monitor platelets by anticoagulation protocol: Yes   Plan:  -No heparin changes needed -Will confirm heparin level later today -Daily heparin level and CBC  Hildred Laser, PharmD Clinical Pharmacist **Pharmacist phone directory can now be found on [email or web address] ([email or web address]).  Listed under [place].  Addendum -heparin level= 0.3  Plan -Will increase to 1200 units/hr to keep in goal -Daily heparin level and CBC  Hildred Laser, PharmD Clinical Pharmacist **Pharmacist phone directory can now be found on [email or web address] ([email or web address]).  Listed under [place].

## 2019-01-30 NOTE — Progress Notes (Signed)
STROKE TEAM PROGRESS NOTE   INTERVAL HISTORY Pt sitting in chair, RN at bedside. Pt overnight neuro stable, had afib RVR, put on amiodarone drip. BP on the low end but artery line at 120s, so pressors were off at this time. Pt right sided weakness much improved but still not fully orientated. Will still need ICU care today.    Vitals:   01/30/19 0500 01/30/19 0600 01/30/19 0700 01/30/19 0800  BP: (!) 89/64 (!) 81/54 (!) 81/61 (!) 88/58  Pulse:      Resp: (!) 28 (!) 37 (!) 26 20  Temp:    (!) 97.2 F (36.2 C)  TempSrc:    Axillary  SpO2:      Weight:      Height:        CBC:  Recent Labs  Lab 01/29/19 0240 01/29/19 1355 01/30/19 0334  WBC 17.4* 18.6* 15.5*  NEUTROABS 14.5* 14.8*  --   HGB 11.8* 11.3* 10.6*  HCT 35.8* 33.2* 31.6*  MCV 83.1 81.4 83.2  PLT 406* 428* 358    Basic Metabolic Panel:  Recent Labs  Lab 01/29/19 0240 01/29/19 1336 01/30/19 0334  NA 130* 137 137  K 4.0 3.8 4.1  CL 103  --  109  CO2 12*  --  11*  GLUCOSE 285*  --  174*  BUN 47*  --  47*  CREATININE 1.94*  --  1.91*  CALCIUM 7.4*  --  8.1*   Lipid Panel:     Component Value Date/Time   CHOL 123 01/29/2019 0222   TRIG 244 (H) 01/29/2019 0222   HDL <10 (L) 01/29/2019 0222   CHOLHDL NOT CALCULATED 01/29/2019 0222   VLDL 49 (H) 01/29/2019 0222   LDLCALC NOT CALCULATED 01/29/2019 0222   HgbA1c:  Lab Results  Component Value Date   HGBA1C 8.8 (H) 01/29/2019   Urine Drug Screen: No results found for: LABOPIA, COCAINSCRNUR, LABBENZ, AMPHETMU, THCU, LABBARB  Alcohol Level     Component Value Date/Time   ETH <10 01/28/2019 2120    IMAGING CT Code Stroke CTA Head W/WO contrast  Result Date: 01/28/2019 CLINICAL DATA:  Stroke.  Slurred speech.  Left facial droop. EXAM: CT ANGIOGRAPHY HEAD AND NECK CT PERFUSION BRAIN TECHNIQUE: Multidetector CT imaging of the head and neck was performed using the standard protocol during bolus administration of intravenous contrast. Multiplanar CT image  reconstructions and MIPs were obtained to evaluate the vascular anatomy. Carotid stenosis measurements (when applicable) are obtained utilizing NASCET criteria, using the distal internal carotid diameter as the denominator. Multiphase CT imaging of the brain was performed following IV bolus contrast injection. Subsequent parametric perfusion maps were calculated using RAPID software. CONTRAST:  OMNIPAQUE IOHEXOL 350 MG/ML SOLN COMPARISON:  CT head 01/28/2019 FINDINGS: CTA NECK FINDINGS Aortic arch: Proximal great vessels widely patent. Incomplete imaging of the aortic arch. Right carotid system: Right common carotid artery widely patent. 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 Right internal carotid artery remains occluded through the cavernous segment. Reconstitution of the supraclinoid internal carotid artery on the right. Left carotid system: Left carotid bifurcation widely patent without significant stenosis. Vertebral arteries: Both vertebral arteries are patent to the basilar without significant stenosis. Skeleton: No acute skeletal abnormality.  Poor dentition. Other neck: Thyroid goiter.  No mass lesion. Upper chest: Mild pleural thickening along the major fissure on the right. Otherwise lungs clear. Review of the MIP images confirms the above findings CTA HEAD FINDINGS Anterior circulation: Right internal carotid artery is occluded  through the cavernous segment with reconstitution of the supraclinoid segment. Both anterior and middle cerebral arteries are patent without significant stenosis or thrombus. Right anterior and middle cerebral arteries supplied through the anterior communicating artery. No significant posterior communicating artery identified on the right. Left cavernous carotid widely patent. Posterior circulation: Both vertebral arteries patent to the basilar. PICA patent. Basilar widely patent. AICA, superior cerebellar, posterior cerebral arteries patent bilaterally.  Venous sinuses: Minimal venous contrast due to arterial phase scanning Anatomic variants: None Review of the MIP images confirms the above findings CT Brain Perfusion Findings: ASPECTS: 10 CBF (<30%) Volume: 13mL Perfusion (Tmax>6.0s) volume: Mismatch Volume: Infarction Location:Right MCA territory shows diffuse delayed perfusion. Core infarct in the right watershed territory in the deep white matter. IMPRESSION: 1. 13 mL core infarct in the watershed territory on the right. Large area of delayed perfusion right MCA territory due to occlusion of the right internal carotid artery. Right anterior middle cerebral arteries are patent and supplied through the anterior communicating artery. Collateral circulation is most likely the reason for delayed perfusion in the right MCA territory. 2. No other significant intracranial stenosis. 3. These results were called by telephone at the time of interpretation on 01/28/2019 at 9:54 pm to provider [facility] , who verbally acknowledged these results. Electronically Signed   By: Marlan Palau M.D.   On: 01/28/2019 21:56   CT Code Stroke CTA Neck W/WO contrast  Result Date: 01/28/2019 CLINICAL DATA:  Stroke.  Slurred speech.  Left facial droop. EXAM: CT ANGIOGRAPHY HEAD AND NECK CT PERFUSION BRAIN TECHNIQUE: Multidetector CT imaging of the head and neck was performed using the standard protocol during bolus administration of intravenous contrast. Multiplanar CT image reconstructions and MIPs were obtained to evaluate the vascular anatomy. Carotid stenosis measurements (when applicable) are obtained utilizing NASCET criteria, using the distal internal carotid diameter as the denominator. Multiphase CT imaging of the brain was performed following IV bolus contrast injection. Subsequent parametric perfusion maps were calculated using RAPID software. CONTRAST:  OMNIPAQUE IOHEXOL 350 MG/ML SOLN COMPARISON:  CT head 01/28/2019 FINDINGS: CTA NECK FINDINGS Aortic  arch: Proximal great vessels widely patent. Incomplete imaging of the aortic arch. Right carotid system: Right common carotid artery widely patent. 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 Right internal carotid artery remains occluded through the cavernous segment. Reconstitution of the supraclinoid internal carotid artery on the right. Left carotid system: Left carotid bifurcation widely patent without significant stenosis. Vertebral arteries: Both vertebral arteries are patent to the basilar without significant stenosis. Skeleton: No acute skeletal abnormality.  Poor dentition. Other neck: Thyroid goiter.  No mass lesion. Upper chest: Mild pleural thickening along the major fissure on the right. Otherwise lungs clear. Review of the MIP images confirms the above findings CTA HEAD FINDINGS Anterior circulation: Right internal carotid artery is occluded through the cavernous segment with reconstitution of the supraclinoid segment. Both anterior and middle cerebral arteries are patent without significant stenosis or thrombus. Right anterior and middle cerebral arteries supplied through the anterior communicating artery. No significant posterior communicating artery identified on the right. Left cavernous carotid widely patent. Posterior circulation: Both vertebral arteries patent to the basilar. PICA patent. Basilar widely patent. AICA, superior cerebellar, posterior cerebral arteries patent bilaterally. Venous sinuses: Minimal venous contrast due to arterial phase scanning Anatomic variants: None Review of the MIP images confirms the above findings CT Brain Perfusion Findings: ASPECTS: 10 CBF (<30%) Volume: 13mL Perfusion (Tmax>6.0s) volume: Mismatch Volume: Infarction Location:Right MCA  territory shows diffuse delayed perfusion. Core infarct in the right watershed territory in the deep white matter. IMPRESSION: 1. 13 mL core infarct in the watershed territory on the right. Large area of  delayed perfusion right MCA territory due to occlusion of the right internal carotid artery. Right anterior middle cerebral arteries are patent and supplied through the anterior communicating artery. Collateral circulation is most likely the reason for delayed perfusion in the right MCA territory. 2. No other significant intracranial stenosis. 3. These results were called by telephone at the time of interpretation on 01/28/2019 at 9:54 pm to provider [facility] , who verbally acknowledged these results. Electronically Signed   By: Marlan Palau M.D.   On: 01/28/2019 21:56   MR MRA HEAD WO CONTRAST  Result Date: 01/29/2019 CLINICAL DATA:  Stroke follow-up. Left-sided weakness. Status post endovascular revascularization of occluded right ICA. EXAM: MRI HEAD WITHOUT CONTRAST MRA HEAD WITHOUT CONTRAST TECHNIQUE: Multiplanar, multiecho pulse sequences of the brain and surrounding structures were obtained without intravenous contrast. Angiographic images of the head were obtained using MRA technique without contrast. COMPARISON:  Head CT, CTA, and CTP 01/28/2019 FINDINGS: MRI HEAD FINDINGS The study is motion degraded throughout with some sequences being severely degraded (including nondiagnostic susceptibility weighted imaging). Brain: There are scattered small acute to early subacute infarcts in the right cerebral hemisphere most notably involving frontoparietal cortex in the perirolandic region. Small infarcts are also present more inferiorly in the right parietal lobe and in the right globus pallidus, and there is a single punctate subcortical infarct in the left parietal lobe. There are small acute to early subacute bilateral cerebellar infarcts as well. Motion artifact limits assessment for intracranial hemorrhage. There is no midline shift or sizable extra-axial fluid collection. No age advanced chronic white matter disease is evident. Cerebral atrophy is not greater than expected for age. Vascular: Major  intracranial vascular flow voids are grossly preserved. Skull and upper cervical spine: No gross marrow lesion. Sinuses/Orbits: Unremarkable orbits. Left maxillary sinusitis. Left anterior ethmoid air cell opacification. No significant mastoid fluid. Other: None. MRA HEAD FINDINGS The study is intermittently severely motion degraded limiting detailed evaluation. The visualized distal vertebral arteries are patent to the basilar and codominant. Cerebellar arteries are not well seen due to motion. The basilar artery is patent with focal severe motion artifact through its midportion and no evidence of significant stenosis elsewhere. Both PCAs are patent proximally. The included distal cervical and intracranial portions of both internal carotid arteries are patent with assessment for stenosis limited by motion. A1 and M1 segments are patent bilaterally with limited assessment of the branch vessels. IMPRESSION: 1. Severely motion degraded examination. 2. Scattered small acute to early subacute infarcts in the cerebrum and cerebellum bilaterally, greatest in the right frontoparietal region. 3. Limited head MRA due to motion with patency of the large intracranial arteries as above. Electronically Signed   By: Sebastian Ache M.D.   On: 01/29/2019 14:04   MR BRAIN WO CONTRAST  Result Date: 01/29/2019 CLINICAL DATA:  Stroke follow-up. Left-sided weakness. Status post endovascular revascularization of occluded right ICA. EXAM: MRI HEAD WITHOUT CONTRAST MRA HEAD WITHOUT CONTRAST TECHNIQUE: Multiplanar, multiecho pulse sequences of the brain and surrounding structures were obtained without intravenous contrast. Angiographic images of the head were obtained using MRA technique without contrast. COMPARISON:  Head CT, CTA, and CTP 01/28/2019 FINDINGS: MRI HEAD FINDINGS The study is motion degraded throughout with some sequences being severely degraded (including nondiagnostic susceptibility weighted imaging). Brain: There are  scattered small acute to early subacute infarcts in the right cerebral hemisphere most notably involving frontoparietal cortex in the perirolandic region. Small infarcts are also present more inferiorly in the right parietal lobe and in the right globus pallidus, and there is a single punctate subcortical infarct in the left parietal lobe. There are small acute to early subacute bilateral cerebellar infarcts as well. Motion artifact limits assessment for intracranial hemorrhage. There is no midline shift or sizable extra-axial fluid collection. No age advanced chronic white matter disease is evident. Cerebral atrophy is not greater than expected for age. Vascular: Major intracranial vascular flow voids are grossly preserved. Skull and upper cervical spine: No gross marrow lesion. Sinuses/Orbits: Unremarkable orbits. Left maxillary sinusitis. Left anterior ethmoid air cell opacification. No significant mastoid fluid. Other: None. MRA HEAD FINDINGS The study is intermittently severely motion degraded limiting detailed evaluation. The visualized distal vertebral arteries are patent to the basilar and codominant. Cerebellar arteries are not well seen due to motion. The basilar artery is patent with focal severe motion artifact through its midportion and no evidence of significant stenosis elsewhere. Both PCAs are patent proximally. The included distal cervical and intracranial portions of both internal carotid arteries are patent with assessment for stenosis limited by motion. A1 and M1 segments are patent bilaterally with limited assessment of the branch vessels. IMPRESSION: 1. Severely motion degraded examination. 2. Scattered small acute to early subacute infarcts in the cerebrum and cerebellum bilaterally, greatest in the right frontoparietal region. 3. Limited head MRA due to motion with patency of the large intracranial arteries as above. Electronically Signed   By: Sebastian Ache M.D.   On: 01/29/2019 14:04   CT  C-SPINE NO CHARGE  Result Date: 01/28/2019 CLINICAL DATA:  Trauma.  Rule out spine fracture EXAM: CT CERVICAL SPINE WITHOUT CONTRAST TECHNIQUE: Multidetector CT imaging of the cervical spine was performed without intravenous contrast. Multiplanar CT image reconstructions were also generated. COMPARISON:  None. FINDINGS: Alignment: Normal Skull base and vertebrae: Negative for fracture Soft tissues and spinal canal: Goiter with enlargement of the right lobe of the thyroid. No adenopathy in the neck. Disc levels: Mild disc degeneration and spurring C5-6 with foraminal narrowing bilaterally. Upper chest: Mild thickening of the major fissure on the right. Otherwise lung apices clear. Other: None IMPRESSION: Negative for cervical spine fracture. Electronically Signed   By: Marlan Palau M.D.   On: 01/28/2019 21:59   CT Code Stroke Cerebral Perfusion with contrast  Result Date: 01/28/2019 CLINICAL DATA:  Stroke.  Slurred speech.  Left facial droop. EXAM: CT ANGIOGRAPHY HEAD AND NECK CT PERFUSION BRAIN TECHNIQUE: Multidetector CT imaging of the head and neck was performed using the standard protocol during bolus administration of intravenous contrast. Multiplanar CT image reconstructions and MIPs were obtained to evaluate the vascular anatomy. Carotid stenosis measurements (when applicable) are obtained utilizing NASCET criteria, using the distal internal carotid diameter as the denominator. Multiphase CT imaging of the brain was performed following IV bolus contrast injection. Subsequent parametric perfusion maps were calculated using RAPID software. CONTRAST:  OMNIPAQUE IOHEXOL 350 MG/ML SOLN COMPARISON:  CT head 01/28/2019 FINDINGS: CTA NECK FINDINGS Aortic arch: Proximal great vessels widely patent. Incomplete imaging of the aortic arch. Right carotid system: Right common carotid artery widely patent. 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 Right internal carotid artery remains occluded  through the cavernous segment. Reconstitution of the supraclinoid internal carotid artery on the right. Left carotid system: Left carotid bifurcation widely patent without significant stenosis. Vertebral  arteries: Both vertebral arteries are patent to the basilar without significant stenosis. Skeleton: No acute skeletal abnormality.  Poor dentition. Other neck: Thyroid goiter.  No mass lesion. Upper chest: Mild pleural thickening along the major fissure on the right. Otherwise lungs clear. Review of the MIP images confirms the above findings CTA HEAD FINDINGS Anterior circulation: Right internal carotid artery is occluded through the cavernous segment with reconstitution of the supraclinoid segment. Both anterior and middle cerebral arteries are patent without significant stenosis or thrombus. Right anterior and middle cerebral arteries supplied through the anterior communicating artery. No significant posterior communicating artery identified on the right. Left cavernous carotid widely patent. Posterior circulation: Both vertebral arteries patent to the basilar. PICA patent. Basilar widely patent. AICA, superior cerebellar, posterior cerebral arteries patent bilaterally. Venous sinuses: Minimal venous contrast due to arterial phase scanning Anatomic variants: None Review of the MIP images confirms the above findings CT Brain Perfusion Findings: ASPECTS: 10 CBF (<30%) Volume: 13mL Perfusion (Tmax>6.0s) volume: Mismatch Volume: Infarction Location:Right MCA territory shows diffuse delayed perfusion. Core infarct in the right watershed territory in the deep white matter. IMPRESSION: 1. 13 mL core infarct in the watershed territory on the right. Large area of delayed perfusion right MCA territory due to occlusion of the right internal carotid artery. Right anterior middle cerebral arteries are patent and supplied through the anterior communicating artery. Collateral circulation is most likely the reason for  delayed perfusion in the right MCA territory. 2. No other significant intracranial stenosis. 3. These results were called by telephone at the time of interpretation on 01/28/2019 at 9:54 pm to provider [facility] , who verbally acknowledged these results. Electronically Signed   By: Marlan Palau M.D.   On: 01/28/2019 21:56   DG CHEST PORT 1 VIEW  Result Date: 01/29/2019 CLINICAL DATA:  Stroke due to embolism EXAM: PORTABLE CHEST 1 VIEW COMPARISON:  01/15/2019 FINDINGS: Interstitial coarsening with Charyl Dancer lines. No visible effusion or pneumothorax. Normal heart size and stable mediastinal contours. Enteric tube tip and side-port reaches the stomach. IMPRESSION: Interstitial pulmonary edema. Electronically Signed   By: Marnee Spring M.D.   On: 01/29/2019 05:08   DG Abd Portable 1V  Result Date: 01/29/2019 CLINICAL DATA:  OG tube placement. EXAM: PORTABLE ABDOMEN - 1 VIEW COMPARISON:  None. FINDINGS: Tip of the enteric tube is below the diaphragm in the stomach, the side port is in the region of the gastroesophageal junction. Recommend advancement of least 3 cm for optimal placement. Excreted IV contrast in both renal collecting systems and the urinary bladder from prior IV contrast. Lobular contour of the bladder suggests bladder diverticula or chronic bladder outlet obstruction no bowel obstruction. Surgical clips in the right upper quadrant likely from cholecystectomy. IMPRESSION: Tip of the enteric tube below the diaphragm in the stomach, the side-port in the region of the gastroesophageal junction. Recommend advancement of least 3 cm for optimal placement. Electronically Signed   By: Narda Rutherford M.D.   On: 01/29/2019 03:27   ECHOCARDIOGRAM COMPLETE  Result Date: 01/29/2019   ECHOCARDIOGRAM REPORT   Patient Name:   Adam Leon Date of Exam: 01/29/2019 Medical Rec #:  [id number]       Height:       72.0 in Accession #:    [id number]      Weight:       195.0 lb Date of Birth:  [birth year]        BSA:  2.11 m Patient Age:    85 years        BP:           120/63 mmHg Patient Gender: M               HR:           71 bpm. Exam Location:  Inpatient Procedure: 2D Echo        STAT ECHO Reported to: Dr. Mayford Knifeurner. Indications:     stroke  History:         Patient has no prior history of Echocardiogram examinations.                  Signs/Symptoms:elevated troponin.  Sonographer:     Delcie RochLauren Pennington Referring Phys:  [phone] TRACI R TURNER Diagnosing Phys: Armanda Magicraci Turner MD IMPRESSIONS  1. Left ventricular ejection fraction, by visual estimation, is <20%. The left ventricle has severely decreased function. Left ventricular septal wall thickness was mildly increased. Mildly increased left ventricular posterior wall thickness.  2. There is apical ballooning with akinesis of the entire apical wall. There is akinesis of the mid infero and anteroseptum. There is akinesis of the mid and apical inferior, anterior and lateral walls and akineseis of the apical inferolateral wall.  3. Moderate, fixed thrombus on the apical wall of the left ventricle.  4. Definity contrast agent was given IV to delineate the left ventricular endocardial borders.  5. Global right ventricle has normal systolic function.The right ventricular size is normal. No increase in right ventricular wall thickness.  6. Left atrial size was normal.  7. Right atrial size was normal.  8. The mitral valve is normal in structure. Trivial mitral valve regurgitation. No evidence of mitral stenosis.  9. The tricuspid valve is normal in structure. Tricuspid valve regurgitation is mild. 10. The aortic valve is tricuspid. Aortic valve regurgitation is not visualized. No evidence of aortic valve sclerosis or stenosis. 11. The pulmonic valve was normal in structure. Pulmonic valve regurgitation is not visualized. 12. Normal pulmonary artery systolic pressure. 13. The inferior vena cava is normal in size with greater than 50% respiratory variability, suggesting  right atrial pressure of 3 mmHg. 14. Left ventricular diastolic parameters are consistent with Grade I diastolic dysfunction (impaired relaxation). FINDINGS  Left Ventricle: Left ventricular ejection fraction, by visual estimation, is <20%. The left ventricle has severely decreased function. Definity contrast agent was given IV to delineate the left ventricular endocardial borders. Mildly increased left ventricular posterior wall thickness. Left ventricular diastolic parameters are consistent with Grade I diastolic dysfunction (impaired relaxation). There is a moderate, fixed, apical left ventricular thrombus. The thrombus appears flat (mural) in shape.  There is apical ballooning with akinesis of the entire apical wall. There is akinesis of the mid infero and anteroseptum. There is akinesis of the mid and apical inferior, anterior and lateral walls and akineseis of the apical inferolateral wall. Right Ventricle: The right ventricular size is normal. No increase in right ventricular wall thickness. Global RV systolic function is has normal systolic function. The tricuspid regurgitant velocity is 2.47 m/s, and with an assumed right atrial pressure  of 3 mmHg, the estimated right ventricular systolic pressure is normal at 27.4 mmHg. Left Atrium: Left atrial size was normal in size. Right Atrium: Right atrial size was normal in size Pericardium: There is no evidence of pericardial effusion. Mitral Valve: The mitral valve is normal in structure. Trivial mitral valve regurgitation. No evidence of mitral valve stenosis by observation.  Tricuspid Valve: The tricuspid valve is normal in structure. Tricuspid valve regurgitation is mild. Aortic Valve: The aortic valve is tricuspid. . There is moderate thickening and moderate calcification of the aortic valve. Aortic valve regurgitation is not visualized. The aortic valve is structurally normal, with no evidence of sclerosis or stenosis. There is moderate thickening of the  aortic valve. There is moderate calcification of the aortic valve. Pulmonic Valve: The pulmonic valve was normal in structure. Pulmonic valve regurgitation is not visualized. Pulmonic regurgitation is not visualized. Aorta: The aortic root, ascending aorta and aortic arch are all structurally normal, with no evidence of dilitation or obstruction. Venous: The inferior vena cava is normal in size with greater than 50% respiratory variability, suggesting right atrial pressure of 3 mmHg. IAS/Shunts: No atrial level shunt detected by color flow Doppler. There is no evidence of a patent foramen ovale. No ventricular septal defect is seen or detected. There is no evidence of an atrial septal defect.  LEFT VENTRICLE PLAX 2D LVIDd:         4.20 cm  Diastology LVIDs:         3.00 cm  LV e' lateral: 13.40 cm/s LV PW:         1.20 cm  LV e' medial:  6.20 cm/s LV IVS:        1.20 cm LVOT diam:     1.90 cm LV SV:         44 ml LV SV Index:   20.45 LVOT Area:     2.84 cm  RIGHT VENTRICLE RV S prime:     13.30 cm/s TAPSE (M-mode): 2.3 cm LEFT ATRIUM             Index       RIGHT ATRIUM           Index LA diam:        3.40 cm 1.61 cm/m  RA Area:     12.10 cm LA Vol (A2C):   56.0 ml 26.57 ml/m RA Volume:   28.80 ml  13.66 ml/m LA Vol (A4C):   33.7 ml 15.99 ml/m LA Biplane Vol: 44.3 ml 21.02 ml/m  AORTIC VALVE LVOT Vmax:   54.80 cm/s LVOT Vmean:  41.400 cm/s LVOT VTI:    0.150 m  AORTA Ao Root diam: 2.80 cm TRICUSPID VALVE TR Peak grad:   24.4 mmHg TR Vmax:        247.00 cm/s  SHUNTS Systemic VTI:  0.15 m Systemic Diam: 1.90 cm  Armanda Magic MD Electronically signed by Armanda Magic MD Signature Date/Time: 01/29/2019/4:31:22 AM    Final (Updated)    CT HEAD CODE STROKE WO CONTRAST  Result Date: 01/28/2019 CLINICAL DATA:  Code stroke. Stroke. Slurred speech left facial droop EXAM: CT HEAD WITHOUT CONTRAST TECHNIQUE: Contiguous axial images were obtained from the base of the skull through the vertex without intravenous  contrast. COMPARISON:  CT head 12/11/2017 FINDINGS: Brain: Moderate atrophy unchanged. Negative for hydrocephalus. Negative for acute cortical infarct, hemorrhage, mass. Hypodensity in the central lower pons could represent artifact versus acute infarct. Vascular: Negative for hyperdense vessel Skull: Negative Sinuses/Orbits: Chronic opacification left maxillary sinus with bony thickening unchanged. Mucosal edema left frontal and ethmoid sinus. Negative orbit. Other: None ASPECTS ([place] Stroke Program Early CT Score) - Ganglionic level infarction (caudate, lentiform nuclei, internal capsule, insula, M1-M3 cortex): 7 - Supraganglionic infarction (M4-M6 cortex): 3 Total score (0-10 with 10 being normal): 10 IMPRESSION: 1. No acute cortical infarct or hemorrhage.  2. Hypodensity in the pons could be artifact. 3. ASPECTS is 10 4. These results were called by telephone at the time of interpretation on 01/28/2019 at 9:36 pm to provider [place] , who verbally acknowledged these results. Electronically Signed   By: Marlan Palau M.D.   On: 01/28/2019 21:37   VAS US CAROTID  Result Date: 01/29/2019 Carotid Arterial Duplex Study Indications:       Post thrombectomy. Comparison Study:  No prior study. Performing Technologist: Gertie Fey MHA, RDMS, RVT, RDCS  Examination Guidelines: A complete evaluation includes B-mode imaging, spectral Doppler, color Doppler, and power Doppler as needed of all accessible portions of each vessel. Bilateral testing is considered an integral part of a complete examination. Limited examinations for reoccurring indications may be performed as noted.  Right Carotid Findings: +----------+--------+--------+--------+-----------------------+--------+           PSV cm/sEDV cm/sStenosisPlaque Description     Comments +----------+--------+--------+--------+-----------------------+--------+ CCA Prox  60      16                                               +----------+--------+--------+--------+-----------------------+--------+ CCA Distal52      12              smooth and heterogenous         +----------+--------+--------+--------+-----------------------+--------+ ICA Prox  49      11              smooth and heterogenous         +----------+--------+--------+--------+-----------------------+--------+ ICA Distal90      28                                              +----------+--------+--------+--------+-----------------------+--------+ ECA       60      11                                              +----------+--------+--------+--------+-----------------------+--------+ +---------+--------+--+--------+--+---------+ VertebralPSV cm/s49EDV cm/s13Antegrade +---------+--------+--+--------+--+---------+  Summary: Right Carotid: Velocities in the right ICA are consistent with a 1-39% stenosis.  *See table(s) above for measurements and observations.    Preliminary    [place] EKG SITE RITE  Result Date: 01/29/2019 If Site Rite image not attached, placement could not be confirmed due to current cardiac rhythm.  Cerebral Angiogram S/P bilateral common carotid and RT Vert arteriogram followed by complete revascularization of occluded extracranial and intracranial RT ICA with x 2 passes with 40 mm solitaireX retriever device and penumbra aspiration achieving a TICI 3  Rt MCA revascularization.   PHYSICAL EXAM  Temp:  [97.2 F (36.2 C)-98.8 F (37.1 C)] 97.2 F (36.2 C) (12/18 0800) Pulse Rate:  [27-117] 29 (12/18 0215) Resp:  [15-37] 20 (12/18 0800) BP: (81-125)/(45-94) 88/58 (12/18 0800) SpO2:  [72 %-100 %] 87 % (12/18 0215) Arterial Line BP: (99-135)/(33-67) 100/45 (12/18 0800)  General - Well nourished, well developed, in no apparent distress.  Ophthalmologic - fundi not visualized due to noncooperation.  Cardiovascular - Regular rhythm and rate, not in afib.  Neuro - awake alert, fluent language, however,  intermittently confused and incoherent. He  is orientated to self, place and month and people, but not orientated to year and age. Able to name without difficulty, able to repeat simple sentences. Able to follow simple commands. No gaze deviation, no hemianopia. However, more right side preference and mild left sided neglect. Tracking on both sides, PERRL. Mild left facial droop. Tongue midline. Left UE 4/5. LLE proximal 4+/5, distal 5/5 with toe DF and PF. Sensation symmtrical subjectively. FTN intact on the right but dysmetria on the left. Gait not tested.  ASSESSMENT/PLAN Adam Leon is a 85 y.o. male with history of HLD, HTN, DM presenting following a fall with L sided weakness face, arm and leg.   Stroke: bilateral anterior and posterior infarcts with L ICA occlusion s/p IR w TICI3 revascularization, likely embolic d/t new AF w/ RVR and LV thrombus in the setting of undiagnosed recent MI  Code Stroke CT head No acute abnormality. pontiue hypodensity. ASPECTS 10.     CTA head & neck R ICA occlusion. Decreased R MCA flow w/ good cross-filling.  CT perfusion 13 mL core infarct R watershed w/ delayed perfusion R MCA d/t R ICA occlusion   Cerebral Angio occlusion intra and extracranial ICA with TICI3 reperfusion    MRI  Scattered small cerebrum and cerebellar bilateral infarcts (greatest R frontoparietal)  MRA  Limited unremarkable  Carotid Doppler  R 1-39% stenosis    2D Echo severe LV dysfunction < 20% w/ possible stress cardiomyopathy w/ apical ballooning and LV thrombus  LDL NTC due to TG 244 and low HDL < 10  direct LDL pending   HgbA1c 8.8  IV heparin for VTE prophylaxis  aspirin 81 mg daily prior to admission, now on aspirin 325 mg daily and heparin IV.   Therapy recommendations:  CIR  Disposition:  pending   PAF w/ RVR, new diagnosis  Home anticoagulation:  none   CHA2DS2-VASc Score = at least 6, ?2 oral anticoagulation recommended  Age in Years:  ?7   +2     Sex:  Male   0    Hypertension History:  yes   +1     Diabetes Mellitus:  yes   +1  Congestive Heart Failure History:  0  Vascular Disease History:  yes   +1     Stroke/TIA/Thromboembolism History:  yes   +2 . Placed on IV heparin also given LV clot . Started on amiodarone w/ bolus early am due to RVR . Plan AC at d/c  Abnormal EKG w/ ST elevation LV Systolic Dysunction, LV thrombus  Felt likely reflective of ICA occlusion w/ demand ischemia  EKG w/ concern for anterior MI  Trop 15519->15715->11893   2D Echo severe LV dysfunction < 20% w/ possible stress cardiomyopathy w/ apical ballooning and LV thrombus  ? Recent cardiac event w/ continued troponin elevation  No BB d/t low BP  BNP 4321.1  Cardiology Dr. Katrinka Blazing on board   Hypotension Hx Hypertension  Home meds:  atenolol 25 daily, lisinopril 5  Low BP felt to be related to AF in setting of severe LV dysfunction  BP goal 120-140 by artery line  PRN levophed for BP goal  Stopped IVF d/t severe LV dysfunction . Long-term BP goal normotensive  Hyperlipidemia  Home meds:  pravachol 40  LDL NTC (TG 244 and HDL < 10), goal < 70  Direct LDL pending  Statin held d/t elevated LFTs - pending in am  Continue statin once LFTs improved  Diabetes type II Uncontrolled  Home meds:  Metformin 500 bid  HgbA1c 8.8, goal < 7.0  CBGs  SSI  Close PCP follow up  UGIB, resolved - ? Stress ulcer  Coffee ground color emesis - resolved   Off NG suction  PPI IV Q12h  On diet now  Other Stroke Risk Factors  Advanced age  Other Active Problems  Mild cognitive decline  Physical deconditioning over past 2 weeks going from independent walking to using a walker. Had ER eval 2 wks ago for fatigue and decreased appetite.   AKI 2.32->2.2->1.94->1.91   Leukocytosis 15.0-17.4-18.6-15.5  Acute blood loss anemia 12.9-13.6-11.8-11.3-10.6   Mild transaminitis AST 67-> 68  ALT 49->50 ALP 133->146 (12/17) - pending  in am  Hospital day # 2  This patient is critically ill due to stroke, ICA occlusion s/p IR, MI, LV thrombus, afib RVR and at significant risk of neurological worsening, death form heart failure, cardiac shock, recurrent stroke, hemorrhagic conversion, seizure. This patient's care requires constant monitoring of vital signs, hemodynamics, respiratory and cardiac monitoring, review of multiple databases, neurological assessment, discussion with family, other specialists and medical decision making of high complexity. I spent 35 minutes of neurocritical care time in the care of this patient.   Rosalin Hawking, MD PhD Stroke Neurology 01/30/2019 8:53 AM    To contact Stroke Continuity provider, please refer to [email or web address] After hours, contact General Neurology

## 2019-01-30 NOTE — Progress Notes (Addendum)
Physical Therapy Treatment Patient Details Name: Adam Leon MRN: [id number] DOB: 1930/05/24 Today's Date: 01/30/2019    History of Present Illness [name] is a 85 y.o. male with history of HLD, HTN, DM presenting following a fall with L sided weakness face, arm and leg. R MCA infarct due to L ICA occlusion s/p IR w TICI3 revascularization, likely embolic d/t new AF w/ RVR and LV thrombus in the setting of undiagnosed recent MI    PT Comments    Pt admitted for above. Pt's left sided-weakness apparent but he was able to perform AROM of L UE and L LE for all tasks and demonstrated fair grip strength. Pt required significant cueing for exercise instruction, attention to task, sequencing, and walker placement throughout and seems most limited by cognition. He required mod assist +2 to initiate bed mobility and manage B LE, but was able to lift his trunk with min assist. He demonstrated improvement in functional mobility by increasing his gait distance and decreasing assistance level with transfers. He had tendency to veer to the left for most of gait training and required physical assistance to move walker away from obstacles. Pt reported that he became tired after ambulating 50 ft in hall and had to bring recliner to pt to sit. Pt pleasantly confused throughout session. Pt would benefit from continued skilled acute PT intervention in order to address deficits.   Starting vitals: 114/47 mmHg, 75 bpm Ending vitals: 125/58 mmHg, 78 bpm   Follow Up Recommendations  CIR     Equipment Recommendations  None recommended by PT(TBD next venue of care)    Recommendations for Other Services       Precautions / Restrictions Precautions Precautions: Fall Restrictions Weight Bearing Restrictions: No    Mobility  Bed Mobility Overal bed mobility: Needs Assistance Bed Mobility: Supine to Sit Rolling: Mod assist   Supine to sit: Mod assist;+2 for physical assistance;+2 for  safety/equipment     General bed mobility comments: modA +2 for initiating movements and B LE management; pt able to lift his trunk once feet off bed  Transfers Overall transfer level: Needs assistance Equipment used: Rolling walker (2 wheeled) Transfers: Sit to/from Stand Sit to Stand: Min assist;+2 physical assistance;+2 safety/equipment         General transfer comment: assist for stability and to reduce impulsivity  Ambulation/Gait Ambulation/Gait assistance: Min assist;+2 safety/equipment Gait Distance (Feet): 50 Feet Assistive device: Rolling walker (2 wheeled) Gait Pattern/deviations: Shuffle;Decreased stride length;Step-through pattern Gait velocity: decreased   General Gait Details: pt able to ambulate into the hall with min assist +2 for safety/chair follow; pt veered to left and required max cueing for redirection of walker; no LOB or unsteadiness; L LE did not buckle   Stairs             Wheelchair Mobility    Modified Rankin (Stroke Patients Only)       Balance Overall balance assessment: Needs assistance Sitting-balance support: No upper extremity supported;Feet supported Sitting balance-Leahy Scale: Fair Sitting balance - Comments: pt able to sit EOB but likely would not tolerate challenge   Standing balance support: During functional activity;Bilateral upper extremity supported Standing balance-Leahy Scale: Poor Standing balance comment: required BUE on walker for support                            Cognition Arousal/Alertness: Awake/alert Behavior During Therapy: Flat affect Overall Cognitive Status: Impaired/Different from baseline Area of Impairment:  Orientation;Attention;Memory;Following commands;Safety/judgement;Awareness;Problem solving                 Orientation Level: Disoriented to;Place;Time;Situation Current Attention Level: Sustained Memory: Decreased short-term memory Following Commands: Follows one step  commands inconsistently Safety/Judgement: Decreased awareness of safety;Decreased awareness of deficits Awareness: Intellectual Problem Solving: Slow processing;Decreased initiation;Difficulty sequencing;Requires verbal cues;Requires tactile cues General Comments: Pt pleasantly confused throughout      Exercises General Exercises - Lower Extremity Ankle Circles/Pumps: AROM;Both;10 reps;Supine Heel Slides: AROM;Both;10 reps;Supine    General Comments        Pertinent Vitals/Pain Pain Assessment: Faces Faces Pain Scale: No hurt Pain Intervention(s): Monitored during session    Home Living                      Prior Function            PT Goals (current goals can now be found in the care plan section) Acute Rehab PT Goals Patient Stated Goal: to go home safely PT Goal Formulation: With patient Time For Goal Achievement: 02/12/19 Potential to Achieve Goals: Good Progress towards PT goals: Progressing toward goals    Frequency    Min 3X/week      PT Plan Current plan remains appropriate    Co-evaluation              AM-PAC PT "6 Clicks" Mobility   Outcome Measure  Help needed turning from your back to your side while in a flat bed without using bedrails?: A Lot Help needed moving from lying on your back to sitting on the side of a flat bed without using bedrails?: A Lot Help needed moving to and from a bed to a chair (including a wheelchair)?: A Little Help needed standing up from a chair using your arms (e.g., wheelchair or bedside chair)?: A Little Help needed to walk in hospital room?: A Little Help needed climbing 3-5 steps with a railing? : A Lot 6 Click Score: 15    End of Session Equipment Utilized During Treatment: Gait belt Activity Tolerance: Patient tolerated treatment well;Patient limited by fatigue Patient left: in chair;with call bell/phone within reach;with chair alarm set Nurse Communication: Mobility status PT Visit Diagnosis:  Unsteadiness on feet (R26.81);Other abnormalities of gait and mobility (R26.89);Other symptoms and signs involving the nervous system (R29.898)     Time: 5053-9767 PT Time Calculation (min) (ACUTE ONLY): 33 min  Charges:  $Gait Training: 8-22 mins $Therapeutic Exercise: 8-22 mins                     Christel Mormon, SPT   [place] Josephine Rudnick 01/30/2019, 12:56 PM

## 2019-01-31 DIAGNOSIS — I513 Intracardiac thrombosis, not elsewhere classified: Secondary | ICD-10-CM | POA: Diagnosis present

## 2019-01-31 DIAGNOSIS — I2109 ST elevation (STEMI) myocardial infarction involving other coronary artery of anterior wall: Secondary | ICD-10-CM | POA: Diagnosis present

## 2019-01-31 LAB — BASIC METABOLIC PANEL
Anion gap: 15 (ref 5–15)
BUN: 50 mg/dL — ABNORMAL HIGH (ref 8–23)
CO2: 16 mmol/L — ABNORMAL LOW (ref 22–32)
Calcium: 8 mg/dL — ABNORMAL LOW (ref 8.9–10.3)
Chloride: 106 mmol/L (ref 98–111)
Creatinine, Ser: 1.8 mg/dL — ABNORMAL HIGH (ref 0.61–1.24)
GFR calc Af Amer: 39 mL/min — ABNORMAL LOW (ref >60)
GFR calc non Af Amer: 33 mL/min — ABNORMAL LOW (ref >60)
Glucose, Bld: 151 mg/dL — ABNORMAL HIGH (ref 70–99)
Potassium: 3.6 mmol/L (ref 3.5–5.1)
Sodium: 137 mmol/L (ref 135–145)

## 2019-01-31 LAB — CBC
HCT: 32.1 % — ABNORMAL LOW (ref 39.0–52.0)
Hemoglobin: 10.6 g/dL — ABNORMAL LOW (ref 13.0–17.0)
MCH: 27.5 pg (ref 26.0–34.0)
MCHC: 33 g/dL (ref 30.0–36.0)
MCV: 83.4 fL (ref 80.0–100.0)
Platelets: 409 10*3/uL — ABNORMAL HIGH (ref 150–400)
RBC: 3.85 MIL/uL — ABNORMAL LOW (ref 4.22–5.81)
RDW: 13.6 % (ref 11.5–15.5)
WBC: 15.1 10*3/uL — ABNORMAL HIGH (ref 4.0–10.5)
nRBC: 0 % (ref 0.0–0.2)

## 2019-01-31 LAB — HEPATIC FUNCTION PANEL
ALT: 105 U/L — ABNORMAL HIGH (ref 0–44)
AST: 114 U/L — ABNORMAL HIGH (ref 15–41)
Albumin: 2 g/dL — ABNORMAL LOW (ref 3.5–5.0)
Alkaline Phosphatase: 118 U/L (ref 38–126)
Bilirubin, Direct: 0.3 mg/dL — ABNORMAL HIGH (ref 0.0–0.2)
Indirect Bilirubin: 0.6 mg/dL (ref 0.3–0.9)
Total Bilirubin: 0.9 mg/dL (ref 0.3–1.2)
Total Protein: 5.4 g/dL — ABNORMAL LOW (ref 6.5–8.1)

## 2019-01-31 LAB — GLUCOSE, CAPILLARY
Glucose-Capillary: 114 mg/dL — ABNORMAL HIGH (ref 70–99)
Glucose-Capillary: 147 mg/dL — ABNORMAL HIGH (ref 70–99)
Glucose-Capillary: 130 mg/dL — ABNORMAL HIGH (ref 70–99)
Glucose-Capillary: 142 mg/dL — ABNORMAL HIGH (ref 70–99)
Glucose-Capillary: 131 mg/dL — ABNORMAL HIGH (ref 70–99)
Glucose-Capillary: 109 mg/dL — ABNORMAL HIGH (ref 70–99)

## 2019-01-31 LAB — PROTIME-INR
INR: 1.4 — ABNORMAL HIGH (ref 0.8–1.2)
Prothrombin Time: 17.2 seconds — ABNORMAL HIGH (ref 11.4–15.2)

## 2019-01-31 LAB — HEPARIN LEVEL (UNFRACTIONATED): Heparin Unfractionated: 0.37 IU/mL (ref 0.30–0.70)

## 2019-01-31 MED ORDER — CARVEDILOL 3.125 MG PO TABS
3.1250 mg | ORAL_TABLET | Freq: Two times a day (BID) | ORAL | Status: DC
  Administered 2019-01-31 – 2019-02-03 (×7): 3.125 mg via ORAL
  Filled 2019-01-31 (×9): qty 1

## 2019-01-31 MED ORDER — ASPIRIN 325 MG PO TABS
325.0000 mg | ORAL_TABLET | Freq: Every day | ORAL | Status: DC
  Administered 2019-01-31 – 2019-02-01 (×2): 325 mg via ORAL
  Filled 2019-01-31: qty 1

## 2019-01-31 MED ORDER — WARFARIN - PHARMACIST DOSING INPATIENT: Freq: Every day | Status: DC

## 2019-01-31 MED ORDER — WARFARIN SODIUM 5 MG PO TABS
5.0000 mg | ORAL_TABLET | Freq: Once | ORAL | Status: AC
  Administered 2019-01-31: 5 mg via ORAL
  Filled 2019-01-31: qty 1

## 2019-01-31 NOTE — Progress Notes (Addendum)
NAME:  Zymarion Favorite, MRN:  [id number], DOB:  [birth year], LOS: 3 ADMISSION DATE:  01/28/2019, CONSULTATION DATE:  01/29/19 REFERRING MD:  Wilford Corner  CHIEF COMPLAINT:  R ICA occlusion   Brief History   Arvind Mexicano is a 85 y.o. male who was admitted 12/16 with R ICA occlusion.  He was taken to IR for revascularization.  History of present illness   Mavis Fichera is a 85 y.o. male who has a PMH including but not limited to syncope, HLD.  He presented to [facility] ED 12/16 with left hemiplegia.  CTA showed R ICA occlusion.  He was subsequently taken to IR for arteriogram with complete revascularization of occluded extracranial and intracranial R ICA.  Post IR, he had hypotension.  Goal SBP was 120 - 140; therefore, he was started on neosynephrine and PCCM was consulted for assistance.  Of note, he had trop of > [id number].  Initial EKG concerning for anterior infarct.  Cards consulted by EDP who felt might represent subacute insult.  Recommended supportive care and echo following carotid revascularization.    Past Medical History  has Acute ischemic stroke (HCC); Internal carotid artery occlusion, right; AKI (acute kidney injury) (HCC); Hypotension due to hypovolemia; Troponin level elevated; Acute CVA (cerebrovascular accident) (HCC); Dyslipidemia; Essential hypertension; Syncope; New onset atrial fibrillation (HCC); Coronary artery disease involving native coronary artery of native heart without angina pectoris; Uncontrolled type 2 diabetes mellitus with hyperglycemia (HCC); and Leukocytosis on their problem list.  Significant Hospital Events   12/16 > admitted, had IR revascularization.  Consults:  PCCM, cardiology.  Procedures:  ETT 12/16 > 12/16. Left radial art line 12/16 >   Significant Diagnostic Tests:  CT head 12/16 > no acute infarct. CTA head / neck 12/16 > 24ml core infarct on right.  Occlusion of right ICA.  MRI/MRA brain 12/17   IMPRESSION: 1. Severely motion degraded  examination. 2. Scattered small acute to early subacute infarcts in the cerebrum and cerebellum bilaterally, greatest in the right frontoparietal region. 3. Limited head MRA due to motion with patency of the large intracranial arteries as above.  Echo 12/17   1. Left ventricular ejection fraction, by visual estimation, is <20%. The left ventricle has severely decreased function. Left ventricular septal wall thickness was mildly increased. Mildly increased left ventricular posterior wall thickness.  2. There is apical ballooning with akinesis of the entire apical wall. There is akinesis of the mid infero and anteroseptum. There is akinesis of the mid and apical inferior, anterior and lateral walls and akineseis of the apical inferolateral wall.  3. Moderate, fixed thrombus on the apical wall of the left ventricle.  4. Definity contrast agent was given IV to delineate the left ventricular endocardial borders.  5. Global right ventricle has normal systolic function.The right ventricular size is normal. No increase in right ventricular wall thickness.  6. Left atrial size was normal.  7. Right atrial size was normal.  8. The mitral valve is normal in structure. Trivial mitral valve regurgitation. No evidence of mitral stenosis.  9. The tricuspid valve is normal in structure. Tricuspid valve regurgitation is mild. 10. The aortic valve is tricuspid. Aortic valve regurgitation is not visualized. No evidence of aortic valve sclerosis or stenosis. 11. The pulmonic valve was normal in structure. Pulmonic valve regurgitation is not visualized. 12. Normal pulmonary artery systolic pressure. 13. The inferior vena cava is normal in size with greater than 50% respiratory variability, suggesting right atrial pressure of 3 mmHg. 14. Left ventricular diastolic  parameters are consistent with Grade I diastolic dysfunction (impaired relaxation).   Micro Data:  SARS CoV2 12/16 > neg. Flu 12/16 >  neg.  Antimicrobials:  None.   Interim history/subjective:  Remains off pressors.  Speech varies.  Objective:  Blood pressure (!) 102/55, pulse 76, temperature (!) 97.4 F (36.3 C), temperature source Axillary, resp. rate (!) 28, height 6' (1.829 m), weight 88.5 kg, SpO2 100 %.        Intake/Output Summary (Last 24 hours) at 01/31/2019 0944 Last data filed at 01/31/2019 0800 Gross per 24 hour  Intake 868.02 ml  Output 280 ml  Net 588.02 ml   Filed Weights   01/28/19 2155  Weight: 88.5 kg   Physical Exam: General: Well-developed, well nourished elderly male. NAD HENT: Normocephalic, PERRL. Moist mucus membranes Neck: No JVD. Trachea midline.  CV: RRR. S1S2. No MRG. +2 distal pulses Lungs: BBS present, clear, FNL, symmetrical ABD: +BS x4. SNT/ND. No masses, guarding or rigidity GU: Foley EXT: MAE well 4/5 strength upper and lower. No edema Skin: PWD. In tact. No rashes or lesions Neuro: A&Ox1-2. Word finding and speech varies from difficulty and jumbled to readily and clearly comprehensible. Jovial. Slight L tongue drift    Assessment & Plan:   Hypotension - resolved. Initially thought to be related to sedation overnight however remains persistent, likely 2/2 recent MI and age. Remains off pressors   Acute right CVA secondary to R ICA occlusion s/p embolectomy. Likely embolic CVA in setting of LV thrombus -per Neuro -ASA  Anterior STEMI - Peak trop 15K Stress cardiomyopathy AFRVR LV Thrombus -Cardiology following. Not a candidate for cath due to CVA -Heparin gtt/C per cardiology  AKI likely pre-renal Metabolic acidosis -Continue to Monitor UOP/Cr - sodium bicarbonate tablets TID  Best Practice:  Diet: Dysphagia diet Pain/Anxiety/Delirium protocol (if indicated): N/A. VAP protocol (if indicated): N/A. DVT prophylaxis: SCD's. GI prophylaxis: N/A. Glucose control: SSI. Mobility: BPT /OT per stroke pathway  Code Status: Full. Family Communication:  per primary  Disposition: continue ICU for BP monitoring.   PCCM will sign off. Thank you for the opportunity to participate in this patient's care. Please contact if we can be of further assistance.   Labs   CBC: Recent Labs  Lab 01/28/19 2120 01/29/19 0240 01/29/19 1336 01/29/19 1355 01/30/19 0334 01/31/19 0637  WBC 15.0* 17.4*  --  18.6* 15.5* 15.1*  NEUTROABS 12.1* 14.5*  --  14.8*  --   --   HGB 12.9* 11.8* 11.2* 11.3* 10.6* 10.6*  HCT 38.8* 35.8* 33.0* 33.2* 31.6* 32.1*  MCV 83.3 83.1  --  81.4 83.2 83.4  PLT 359 406*  --  428* 358 409*   Basic Metabolic Panel: Recent Labs  Lab 01/28/19 2120 01/28/19 2126 01/29/19 0240 01/29/19 1336 01/30/19 0334 01/30/19 1125 01/30/19 1729 01/31/19 0637  NA 133* 134* 130* 137 137  --  137 137  K 4.6 4.6 4.0 3.8 4.1  --  3.9 3.6  CL 100 98 103  --  109  --  109 106  CO2 18*  --  12*  --  11*  --  15* 16*  GLUCOSE 225* 224* 285*  --  174*  --  179* 151*  BUN 54* 68* 47*  --  47*  --  50* 50*  CREATININE 2.32* 2.20* 1.94*  --  1.91*  --  1.70* 1.80*  CALCIUM 8.1*  --  7.4*  --  8.1*  --  8.3* 8.0*  MG  --   --   --   --   --  2.0  --   --    GFR: Estimated Creatinine Clearance: 32.3 mL/min (A) (by C-G formula based on SCr of 1.8 mg/dL (H)). Recent Labs  Lab 01/29/19 0240 01/29/19 1330 01/29/19 1355 01/29/19 1554 01/30/19 0334 01/30/19 1729 01/31/19 0637  WBC 17.4*  --  18.6*  --  15.5*  --  15.1*  LATICACIDVEN  --  2.2*  --  2.6* 2.1* 1.7  --    Liver Function Tests: Recent Labs  Lab 01/28/19 2120 01/29/19 0240 01/31/19 0637  AST 67* 68* 114*  ALT 49* 50* 105*  ALKPHOS 133* 146* 118  BILITOT 1.6* 1.6* 0.9  PROT 6.0* 5.4* 5.4*  ALBUMIN 2.3* 2.0* 2.0*   No results for input(s): LIPASE, AMYLASE in the last 168 hours. No results for input(s): AMMONIA in the last 168 hours. ABG    Component Value Date/Time   PHART 7.434 01/29/2019 1336   PCO2ART 26.3 (L) 01/29/2019 1336   PO2ART 74.0 (L) 01/29/2019 1336    HCO3 17.7 (L) 01/29/2019 1336   TCO2 19 (L) 01/29/2019 1336   ACIDBASEDEF 5.0 (H) 01/29/2019 1336   O2SAT 96.0 01/29/2019 1336    Coagulation Profile: Recent Labs  Lab 01/31/19 0637  INR 1.4*   Cardiac Enzymes: No results for input(s): CKTOTAL, CKMB, CKMBINDEX, TROPONINI in the last 168 hours. HbA1C: Hgb A1c MFr Bld  Date/Time Value Ref Range Status  01/29/2019 02:22 AM 8.8 (H) 4.8 - 5.6 % Final    Comment:    (NOTE) Pre diabetes:          5.7%-6.4% Diabetes:              >6.4% Glycemic control for   <7.0% adults with diabetes    CBG: Recent Labs  Lab 01/30/19 1549 01/30/19 1934 01/30/19 2320 01/31/19 0314 01/31/19 0756  GLUCAP 153* 177* 167* 114* 147*    PCCM will sign off. Thank you for the opportunity to participate in this patient's care. Please contact if we can be of further assistance.  Francine Graven, MSN, AGACNP  [facility]   Agree with above Hypotension has resolved Cardiology assisting for management of LV apical thrombus and acute systolic heart failure. Embolic CVA being managed by neurology, some residual acute encephalopathy with dysphasia Mild AG metabolic acidosis due to AKI, baseline creatinine 1.1  Mandela Bello V. Elsworth Soho MD

## 2019-01-31 NOTE — Progress Notes (Signed)
Progress Note  Patient Name: Adam Leon Date of Encounter: 01/31/2019  Primary Cardiologist: new, Dr. Katrinka BlazingSmith  Subjective   Not very talkative.  However the son was in the room with the patient and I was able to speak at length with the son.  Inpatient Medications    Scheduled Meds: . aspirin  325 mg Oral Daily  . chlorhexidine  15 mL Mouth Rinse BID  . Chlorhexidine Gluconate Cloth  6 each Topical Daily  . insulin aspart  0-15 Units Subcutaneous Q4H  . mouth rinse  15 mL Mouth Rinse q12n4p  . pantoprazole (PROTONIX) IV  40 mg Intravenous Q12H  . sodium bicarbonate  650 mg Oral TID  . sodium chloride flush  10-40 mL Intracatheter Q12H  . warfarin  5 mg Oral ONCE-1800  . Warfarin - Pharmacist Dosing Inpatient   Does not apply q1800   Continuous Infusions: . amiodarone 30 mg/hr (01/31/19 1100)  . heparin 1,200 Units/hr (01/31/19 1100)   PRN Meds: acetaminophen **OR** acetaminophen (TYLENOL) oral liquid 160 mg/5 mL **OR** acetaminophen, iohexol, ondansetron (ZOFRAN) IV, senna-docusate, sodium chloride flush   Vital Signs    Vitals:   01/31/19 0900 01/31/19 1000 01/31/19 1100 01/31/19 1200  BP: (!) 104/59 (!) 100/58 95/80   Pulse: 73  66   Resp: (!) 22 18 (!) 24   Temp:    97.6 F (36.4 C)  TempSrc:    Oral  SpO2: 98%  (!) 87%   Weight:      Height:        Intake/Output Summary (Last 24 hours) at 01/31/2019 1306 Last data filed at 01/31/2019 1100 Gross per 24 hour  Intake 728.64 ml  Output 380 ml  Net 348.64 ml    I/O since admission: +3685  [facility]   01/28/19 2155  Weight: 88.5 kg    Telemetry    Sinus at 69 - Personally Reviewed  ECG    01/30/2019 ECG (independently read by me): Normal sinus rhythm at 80 bpm.  ST elevation V2 through V5 consistent with anterior STEMI, incomplete left bundle branch block.  Physical Exam   BP 95/80   Pulse 66   Temp 97.6 F (36.4 C) (Oral)   Resp (!) 24   Ht 6' (1.829 m)   Wt 88.5 kg   SpO2 (!)  87%   BMI 26.45 kg/m  General: Alert, no distress.  Skin: normal turgor, no rashes, warm and dry HEENT: Normocephalic, atraumatic. Pupils equal round and reactive to light; sclera anicteric; extraocular muscles intact;  Nose without nasal septal hypertrophy Mouth/Parynx benign; Neck: No JVD, no carotid bruits; normal carotid upstroke Lungs: clear to ausculatation and percussion; no wheezing or rales Chest wall: without tenderness to palpitation Heart: PMI not displaced, RRR, s1 s2 normal, 1/6 systolic murmur, no diastolic murmur, no rubs, gallops, thrills, or heaves Abdomen: soft, nontender; no hepatosplenomehaly, BS+; abdominal aorta nontender and not dilated by palpation. Back: no CVA tenderness Pulses 2+ Extremities: no clubbing cyanosis or edema, Homan's sign negative  Neurologic: Left hemiparesis Psychologic: Normal mood and affect  Labs    Chemistry Recent Labs  Lab 01/28/19 2120 01/29/19 0240 01/30/19 0334 01/30/19 1729 01/31/19 0637  NA 133* 130* 137 137 137  K 4.6 4.0 4.1 3.9 3.6  CL 100 103 109 109 106  CO2 18* 12* 11* 15* 16*  GLUCOSE 225* 285* 174* 179* 151*  BUN 54* 47* 47* 50* 50*  CREATININE 2.32* 1.94* 1.91* 1.70* 1.80*  CALCIUM 8.1* 7.4* 8.1*  8.3* 8.0*  PROT 6.0* 5.4*  --   --  5.4*  ALBUMIN 2.3* 2.0*  --   --  2.0*  AST 67* 68*  --   --  114*  ALT 49* 50*  --   --  105*  ALKPHOS 133* 146*  --   --  118  BILITOT 1.6* 1.6*  --   --  0.9  GFRNONAA 25* 30* 31* 36* 33*  GFRAA 28* 35* 36* 41* 39*  ANIONGAP 15 15 17* 13 15     Hematology Recent Labs  Lab 01/29/19 1355 01/30/19 0334 01/31/19 0637  WBC 18.6* 15.5* 15.1*  RBC 4.08* 3.80* 3.85*  HGB 11.3* 10.6* 10.6*  HCT 33.2* 31.6* 32.1*  MCV 81.4 83.2 83.4  MCH 27.7 27.9 27.5  MCHC 34.0 33.5 33.0  RDW 12.7 13.1 13.6  PLT 428* 358 409*      Lab 01/28/19 2120 01/28/19 2313 01/29/19 1107  TROPONINIHS 15,519* 95,638* 11,893*           Cardiac EnzymesNo results for input(s): TROPONINI  in the last 168 hours. No results for input(s): TROPIPOC in the last 168 hours.   BNP Recent Labs  Lab 01/29/19 1107  BNP 4,321.1*     DDimer No results for input(s): DDIMER in the last 168 hours.   Lipid Panel     Component Value Date/Time   CHOL 123 01/29/2019 0222   TRIG 244 (H) 01/29/2019 0222   HDL <10 (L) 01/29/2019 0222   CHOLHDL NOT CALCULATED 01/29/2019 0222   VLDL 49 (H) 01/29/2019 0222   LDLCALC NOT CALCULATED 01/29/2019 0222   LDLDIRECT 58.5 01/30/2019 1125     Radiology    MR MRA HEAD WO CONTRAST  Result Date: 01/29/2019 CLINICAL DATA:  Stroke follow-up. Left-sided weakness. Status post endovascular revascularization of occluded right ICA. EXAM: MRI HEAD WITHOUT CONTRAST MRA HEAD WITHOUT CONTRAST TECHNIQUE: Multiplanar, multiecho pulse sequences of the brain and surrounding structures were obtained without intravenous contrast. Angiographic images of the head were obtained using MRA technique without contrast. COMPARISON:  Head CT, CTA, and CTP 01/28/2019 FINDINGS: MRI HEAD FINDINGS The study is motion degraded throughout with some sequences being severely degraded (including nondiagnostic susceptibility weighted imaging). Brain: There are scattered small acute to early subacute infarcts in the right cerebral hemisphere most notably involving frontoparietal cortex in the perirolandic region. Small infarcts are also present more inferiorly in the right parietal lobe and in the right globus pallidus, and there is a single punctate subcortical infarct in the left parietal lobe. There are small acute to early subacute bilateral cerebellar infarcts as well. Motion artifact limits assessment for intracranial hemorrhage. There is no midline shift or sizable extra-axial fluid collection. No age advanced chronic white matter disease is evident. Cerebral atrophy is not greater than expected for age. Vascular: Major intracranial vascular flow voids are grossly preserved. Skull and upper  cervical spine: No gross marrow lesion. Sinuses/Orbits: Unremarkable orbits. Left maxillary sinusitis. Left anterior ethmoid air cell opacification. No significant mastoid fluid. Other: None. MRA HEAD FINDINGS The study is intermittently severely motion degraded limiting detailed evaluation. The visualized distal vertebral arteries are patent to the basilar and codominant. Cerebellar arteries are not well seen due to motion. The basilar artery is patent with focal severe motion artifact through its midportion and no evidence of significant stenosis elsewhere. Both PCAs are patent proximally. The included distal cervical and intracranial portions of both internal carotid arteries are patent with assessment for stenosis limited by motion.  A1 and M1 segments are patent bilaterally with limited assessment of the branch vessels. IMPRESSION: 1. Severely motion degraded examination. 2. Scattered small acute to early subacute infarcts in the cerebrum and cerebellum bilaterally, greatest in the right frontoparietal region. 3. Limited head MRA due to motion with patency of the large intracranial arteries as above. Electronically Signed   By: Sebastian Ache M.D.   On: 01/29/2019 14:04   MR BRAIN WO CONTRAST  Result Date: 01/29/2019 CLINICAL DATA:  Stroke follow-up. Left-sided weakness. Status post endovascular revascularization of occluded right ICA. EXAM: MRI HEAD WITHOUT CONTRAST MRA HEAD WITHOUT CONTRAST TECHNIQUE: Multiplanar, multiecho pulse sequences of the brain and surrounding structures were obtained without intravenous contrast. Angiographic images of the head were obtained using MRA technique without contrast. COMPARISON:  Head CT, CTA, and CTP 01/28/2019 FINDINGS: MRI HEAD FINDINGS The study is motion degraded throughout with some sequences being severely degraded (including nondiagnostic susceptibility weighted imaging). Brain: There are scattered small acute to early subacute infarcts in the right cerebral  hemisphere most notably involving frontoparietal cortex in the perirolandic region. Small infarcts are also present more inferiorly in the right parietal lobe and in the right globus pallidus, and there is a single punctate subcortical infarct in the left parietal lobe. There are small acute to early subacute bilateral cerebellar infarcts as well. Motion artifact limits assessment for intracranial hemorrhage. There is no midline shift or sizable extra-axial fluid collection. No age advanced chronic white matter disease is evident. Cerebral atrophy is not greater than expected for age. Vascular: Major intracranial vascular flow voids are grossly preserved. Skull and upper cervical spine: No gross marrow lesion. Sinuses/Orbits: Unremarkable orbits. Left maxillary sinusitis. Left anterior ethmoid air cell opacification. No significant mastoid fluid. Other: None. MRA HEAD FINDINGS The study is intermittently severely motion degraded limiting detailed evaluation. The visualized distal vertebral arteries are patent to the basilar and codominant. Cerebellar arteries are not well seen due to motion. The basilar artery is patent with focal severe motion artifact through its midportion and no evidence of significant stenosis elsewhere. Both PCAs are patent proximally. The included distal cervical and intracranial portions of both internal carotid arteries are patent with assessment for stenosis limited by motion. A1 and M1 segments are patent bilaterally with limited assessment of the branch vessels. IMPRESSION: 1. Severely motion degraded examination. 2. Scattered small acute to early subacute infarcts in the cerebrum and cerebellum bilaterally, greatest in the right frontoparietal region. 3. Limited head MRA due to motion with patency of the large intracranial arteries as above. Electronically Signed   By: Sebastian Ache M.D.   On: 01/29/2019 14:04   VAS US CAROTID  Result Date: 01/31/2019 Carotid Arterial Duplex Study  Indications:       Post thrombectomy. Comparison Study:  No prior study. Performing Technologist: Gertie Fey MHA, RDMS, RVT, RDCS  Examination Guidelines: A complete evaluation includes B-mode imaging, spectral Doppler, color Doppler, and power Doppler as needed of all accessible portions of each vessel. Bilateral testing is considered an integral part of a complete examination. Limited examinations for reoccurring indications may be performed as noted.  Right Carotid Findings: +----------+--------+--------+--------+-----------------------+--------+           PSV cm/sEDV cm/sStenosisPlaque Description     Comments +----------+--------+--------+--------+-----------------------+--------+ CCA Prox  60      16                                              +----------+--------+--------+--------+-----------------------+--------+  CCA Distal52      12              smooth and heterogenous         +----------+--------+--------+--------+-----------------------+--------+ ICA Prox  49      11              smooth and heterogenous         +----------+--------+--------+--------+-----------------------+--------+ ICA Distal90      28                                              +----------+--------+--------+--------+-----------------------+--------+ ECA       60      11                                              +----------+--------+--------+--------+-----------------------+--------+ +---------+--------+--+--------+--+---------+ VertebralPSV cm/s49EDV cm/s13Antegrade +---------+--------+--+--------+--+---------+  Summary: Right Carotid: Velocities in the right ICA are consistent with a 1-39% stenosis.                This is a limited carotid study and only right side was studied.  *See table(s) above for measurements and observations.  Electronically signed by Delia Heady MD on 01/31/2019 at 12:36:58 PM.   Final    [place] EKG SITE RITE  Result Date: 01/29/2019 If Site Rite  image not attached, placement could not be confirmed due to current cardiac rhythm.   Cardiac Studies   CT head 12/16 > no acute infarct. CTA head / neck 12/16 > 13ml core infarct on right.  Occlusion of right ICA.  MRI/MRA brain 12/17   IMPRESSION: 1. Severely motion degraded examination. 2. Scattered small acute to early subacute infarcts in the cerebrum and cerebellum bilaterally, greatest in the right frontoparietal region. 3. Limited head MRA due to motion with patency of the large intracranial arteries as above.  Echo 12/17  1. Left ventricular ejection fraction, by visual estimation, is <20%. The left ventricle has severely decreased function. Left ventricular septal wall thickness was mildly increased. Mildly increased left ventricular posterior wall thickness. 2. There is apical ballooning with akinesis of the entire apical wall. There is akinesis of the mid infero and anteroseptum. There is akinesis of the mid and apical inferior, anterior and lateral walls and akineseis of the apical inferolateral wall. 3. Moderate, fixed thrombus on the apical wall of the left ventricle. 4. Definity contrast agent was given IV to delineate the left ventricular endocardial borders. 5. Global right ventricle has normal systolic function.The right ventricular size is normal. No increase in right ventricular wall thickness. 6. Left atrial size was normal. 7. Right atrial size was normal. 8. The mitral valve is normal in structure. Trivial mitral valve regurgitation. No evidence of mitral stenosis. 9. The tricuspid valve is normal in structure. Tricuspid valve regurgitation is mild. 10. The aortic valve is tricuspid. Aortic valve regurgitation is not visualized. No evidence of aortic valve sclerosis or stenosis. 11. The pulmonic valve was normal in structure. Pulmonic valve regurgitation is not visualized. 12. Normal pulmonary artery systolic pressure. 13. The inferior vena cava is  normal in size with greater than 50% respiratory variability, suggesting right atrial pressure of 3 mmHg. 14. Left ventricular diastolic parameters are consistent with Grade I diastolic dysfunction (impaired relaxation).  Patient Profile    85 y.o. male with hyperlipidemia, hypertension, diabetes, dementia who presented with who presented with right-sided gaze preference and left-sided hemiplegia.  Subsequently underwent thrombectomy of left carotid/internal carotid.  Cardiac high-sensitivity troponins were elevated on admission and EKG demonstrated anterior ST elevation.  ST elevation has persisted, enzymes are decreasing, thrombus in LV apex, felt to be the source of the patient's stroke on an embolic basis.  Assessment & Plan    1.  Probable anterolateral MI prior to admission resulting in marked wall motion abnormality with apical thrombus and EF less than 20%.  Troponins are on the downturn.  ECG with ST elevation anterolaterally.  Will repeat EKG today.  2.  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  Status post recanalization.  Residual left hemiparesis.  3.  Ischemic cardiomyopathy with EF estimate on echo less than 20%.  Blood pressure on arterial line is 125/50.  Will initiate very low-dose carvedilol post MI at 3.125 mg twice a day.  Will ultimately need ARB therapy but with acute kidney injury and elevated creatinines will hold off on ARB therapy presently.  Initial BNP 4321.  4.  AKI: Creatinine 2.32 >>> 1.8 today.  5.  Atrial fibrillation: Converted to sinus rhythm on IV amiodarone which was started yesterday.  We will continue IV amiodarone today with plans to transition to oral therapy tomorrow.  6.  LFT elevation: May be secondary to MI with transient hepatic congestion.  Must watch closely with amiodarone.  Since LFTs are elevated at present we will recheck in morning and defer initiation of statin today.  7.  Hyperlipidemia:  Target LDL less than 70.  If LV function normalizes, initiate statin therapy   Signed, Lennette Bihari, MD, [facility] 01/31/2019, 1:06 PM

## 2019-01-31 NOTE — Progress Notes (Addendum)
[place] for Heparin/Coumadin Indication: ACS s/p carotid embolectomy and LV thrombus  No Known Allergies  Patient Measurements: Height: 6' (182.9 cm) Weight: 195 lb (88.5 kg) IBW/kg (Calculated) : 77.6  Vital Signs: Temp: 97.4 F (36.3 C) (12/19 0800) Temp Source: Axillary (12/19 0800) BP: 102/55 (12/19 0800) Pulse Rate: 76 (12/19 0800)  Labs: Recent Labs    01/28/19 2120 01/28/19 2126 01/28/19 2313 01/29/19 1107 01/29/19 1336 01/29/19 1355 01/30/19 0334 01/30/19 1125 01/30/19 1729 01/31/19 0637  HGB 12.9*  --   --   --   --  11.3* 10.6*  --   --  10.6*  HCT 38.8*  --   --   --   --  33.2* 31.6*  --   --  32.1*  PLT 359   < >  --   --   --  428* 358  --   --  409*  LABPROT  --   --   --   --   --   --   --   --   --  17.2*  INR  --   --   --   --   --   --   --   --   --  1.4*  HEPARINUNFRC  --   --   --   --    < >  --  0.48 0.30  --  0.37  CREATININE 2.32*  --   --   --   --   --  1.91*  --  1.70* 1.80*  TROPONINIHS 15,519*  --  18,841* 11,893*  --   --   --   --   --   --    < > = values in this interval not displayed.    Estimated Creatinine Clearance: 32.3 mL/min (A) (by C-G formula based on SCr of 1.8 mg/dL (H)).    Assessment: 85 y.o. male with EKG changes, elevated cardiac markers and LV thrombus s/p CVA and carotid thrombectomy continues on IV heparin -heparin level at goal -hg= 10.6, plt= 409  Goal of Therapy:  Heparin level 0.3-0.5 units/mL Monitor platelets by anticoagulation protocol: Yes   Plan:  -Continue heparin drip at 1200 units/hr -Daily heparin level and CBC  Alanda Slim, PharmD, [facility] Clinical Pharmacist Please see AMION for all Pharmacists' Contact Phone Numbers 01/31/2019, 9:22 AM   Addendum:  Consulted to start transition to Coumadin therapy. INR today 1.4  Goal of Therapy:  INR 2 - 3  Plan: Warfarin 5 mg po x 1 tonight Daily INR and CBC  Alanda Slim, PharmD, [facility] Clinical  Pharmacist Please see AMION for all Pharmacists' Contact Phone Numbers 01/31/2019, 9:58 AM

## 2019-01-31 NOTE — Progress Notes (Signed)
eLink Physician-Brief Progress Note Patient Name: Adam Leon DOB: 01/23/1931 MRN: [id number]   Date of Service  01/31/2019  HPI/Events of Note  Agitation - Patient keeps climbing out of bed. Request for Posey belt.   eICU Interventions  Will order Posey belt X 7 hours.      Intervention Category Major Interventions: Delirium, psychosis, severe agitation - evaluation and management  Julie-Ann Vanmaanen [place] 01/31/2019, 1:53 AM

## 2019-01-31 NOTE — Progress Notes (Signed)
STROKE TEAM PROGRESS NOTE   INTERVAL HISTORY Pt lying in bed comfortably, RN at bedside. He remains  on amiodarone drip. BP on the low end.Cardiology helping with AfIb management. Pt right sided weakness much improved but still not fully orientated. Will still need ICU care today.    Vitals:   01/31/19 0500 01/31/19 0600 01/31/19 0700 01/31/19 0800  BP: (!) 100/51 (!) 94/55 100/72 (!) 102/55  Pulse:   80 76  Resp: (!) 23 20 (!) 23 (!) 28  Temp:    (!) 97.4 F (36.3 C)  TempSrc:    Axillary  SpO2:   99% 100%  Weight:      Height:        CBC:  Recent Labs  Lab 01/29/19 0240 01/29/19 1355 01/30/19 0334 01/31/19 0637  WBC 17.4* 18.6* 15.5* 15.1*  NEUTROABS 14.5* 14.8*  --   --   HGB 11.8* 11.3* 10.6* 10.6*  HCT 35.8* 33.2* 31.6* 32.1*  MCV 83.1 81.4 83.2 83.4  PLT 406* 428* 358 409*    Basic Metabolic Panel:  Recent Labs  Lab 01/30/19 1125 01/30/19 1729 01/31/19 0637  NA  --  137 137  K  --  3.9 3.6  CL  --  109 106  CO2  --  15* 16*  GLUCOSE  --  179* 151*  BUN  --  50* 50*  CREATININE  --  1.70* 1.80*  CALCIUM  --  8.3* 8.0*  MG 2.0  --   --    Lipid Panel:     Component Value Date/Time   CHOL 123 01/29/2019 0222   TRIG 244 (H) 01/29/2019 0222   HDL <10 (L) 01/29/2019 0222   CHOLHDL NOT CALCULATED 01/29/2019 0222   VLDL 49 (H) 01/29/2019 0222   LDLCALC NOT CALCULATED 01/29/2019 0222   HgbA1c:  Lab Results  Component Value Date   HGBA1C 8.8 (H) 01/29/2019   Urine Drug Screen: No results found for: LABOPIA, COCAINSCRNUR, LABBENZ, AMPHETMU, THCU, LABBARB  Alcohol Level     Component Value Date/Time   ETH <10 01/28/2019 2120    IMAGING  MR MRA HEAD WO CONTRAST  Result Date: 01/29/2019 CLINICAL DATA:  Stroke follow-up. Left-sided weakness. Status post endovascular revascularization of occluded right ICA. EXAM: MRI HEAD WITHOUT CONTRAST MRA HEAD WITHOUT CONTRAST TECHNIQUE: Multiplanar, multiecho pulse sequences of the brain and surrounding  structures were obtained without intravenous contrast. Angiographic images of the head were obtained using MRA technique without contrast. COMPARISON:  Head CT, CTA, and CTP 01/28/2019 FINDINGS: MRI HEAD FINDINGS The study is motion degraded throughout with some sequences being severely degraded (including nondiagnostic susceptibility weighted imaging). Brain: There are scattered small acute to early subacute infarcts in the right cerebral hemisphere most notably involving frontoparietal cortex in the perirolandic region. Small infarcts are also present more inferiorly in the right parietal lobe and in the right globus pallidus, and there is a single punctate subcortical infarct in the left parietal lobe. There are small acute to early subacute bilateral cerebellar infarcts as well. Motion artifact limits assessment for intracranial hemorrhage. There is no midline shift or sizable extra-axial fluid collection. No age advanced chronic white matter disease is evident. Cerebral atrophy is not greater than expected for age. Vascular: Major intracranial vascular flow voids are grossly preserved. Skull and upper cervical spine: No gross marrow lesion. Sinuses/Orbits: Unremarkable orbits. Left maxillary sinusitis. Left anterior ethmoid air cell opacification. No significant mastoid fluid. Other: None. MRA HEAD FINDINGS The study is intermittently severely  motion degraded limiting detailed evaluation. The visualized distal vertebral arteries are patent to the basilar and codominant. Cerebellar arteries are not well seen due to motion. The basilar artery is patent with focal severe motion artifact through its midportion and no evidence of significant stenosis elsewhere. Both PCAs are patent proximally. The included distal cervical and intracranial portions of both internal carotid arteries are patent with assessment for stenosis limited by motion. A1 and M1 segments are patent bilaterally with limited assessment of the  branch vessels. IMPRESSION: 1. Severely motion degraded examination. 2. Scattered small acute to early subacute infarcts in the cerebrum and cerebellum bilaterally, greatest in the right frontoparietal region. 3. Limited head MRA due to motion with patency of the large intracranial arteries as above. Electronically Signed   By: Sebastian Ache M.D.   On: 01/29/2019 14:04   MR BRAIN WO CONTRAST  Result Date: 01/29/2019 CLINICAL DATA:  Stroke follow-up. Left-sided weakness. Status post endovascular revascularization of occluded right ICA. EXAM: MRI HEAD WITHOUT CONTRAST MRA HEAD WITHOUT CONTRAST TECHNIQUE: Multiplanar, multiecho pulse sequences of the brain and surrounding structures were obtained without intravenous contrast. Angiographic images of the head were obtained using MRA technique without contrast. COMPARISON:  Head CT, CTA, and CTP 01/28/2019 FINDINGS: MRI HEAD FINDINGS The study is motion degraded throughout with some sequences being severely degraded (including nondiagnostic susceptibility weighted imaging). Brain: There are scattered small acute to early subacute infarcts in the right cerebral hemisphere most notably involving frontoparietal cortex in the perirolandic region. Small infarcts are also present more inferiorly in the right parietal lobe and in the right globus pallidus, and there is a single punctate subcortical infarct in the left parietal lobe. There are small acute to early subacute bilateral cerebellar infarcts as well. Motion artifact limits assessment for intracranial hemorrhage. There is no midline shift or sizable extra-axial fluid collection. No age advanced chronic white matter disease is evident. Cerebral atrophy is not greater than expected for age. Vascular: Major intracranial vascular flow voids are grossly preserved. Skull and upper cervical spine: No gross marrow lesion. Sinuses/Orbits: Unremarkable orbits. Left maxillary sinusitis. Left anterior ethmoid air cell  opacification. No significant mastoid fluid. Other: None. MRA HEAD FINDINGS The study is intermittently severely motion degraded limiting detailed evaluation. The visualized distal vertebral arteries are patent to the basilar and codominant. Cerebellar arteries are not well seen due to motion. The basilar artery is patent with focal severe motion artifact through its midportion and no evidence of significant stenosis elsewhere. Both PCAs are patent proximally. The included distal cervical and intracranial portions of both internal carotid arteries are patent with assessment for stenosis limited by motion. A1 and M1 segments are patent bilaterally with limited assessment of the branch vessels. IMPRESSION: 1. Severely motion degraded examination. 2. Scattered small acute to early subacute infarcts in the cerebrum and cerebellum bilaterally, greatest in the right frontoparietal region. 3. Limited head MRA due to motion with patency of the large intracranial arteries as above. Electronically Signed   By: Sebastian Ache M.D.   On: 01/29/2019 14:04   VAS US CAROTID  Result Date: 01/29/2019 Carotid Arterial Duplex Study Indications:       Post thrombectomy. Comparison Study:  No prior study. Performing Technologist: Gertie Fey MHA, RDMS, RVT, RDCS  Examination Guidelines: A complete evaluation includes B-mode imaging, spectral Doppler, color Doppler, and power Doppler as needed of all accessible portions of each vessel. Bilateral testing is considered an integral part of a complete examination. Limited examinations for reoccurring indications  may be performed as noted.  Right Carotid Findings: +----------+--------+--------+--------+-----------------------+--------+           PSV cm/sEDV cm/sStenosisPlaque Description     Comments +----------+--------+--------+--------+-----------------------+--------+ CCA Prox  60      16                                               +----------+--------+--------+--------+-----------------------+--------+ CCA Distal52      12              smooth and heterogenous         +----------+--------+--------+--------+-----------------------+--------+ ICA Prox  49      11              smooth and heterogenous         +----------+--------+--------+--------+-----------------------+--------+ ICA Distal90      28                                              +----------+--------+--------+--------+-----------------------+--------+ ECA       60      11                                              +----------+--------+--------+--------+-----------------------+--------+ +---------+--------+--+--------+--+---------+ VertebralPSV cm/s49EDV cm/s13Antegrade +---------+--------+--+--------+--+---------+  Summary: Right Carotid: Velocities in the right ICA are consistent with a 1-39% stenosis.  *See table(s) above for measurements and observations.    Preliminary    [place] EKG SITE RITE  Result Date: 01/29/2019 If Site Rite image not attached, placement could not be confirmed due to current cardiac rhythm.  Cerebral Angiogram S/P bilateral common carotid and RT Vert arteriogram followed by complete revascularization of occluded extracranial and intracranial RT ICA with x 2 passes with 40 mm solitaireX retriever device and penumbra aspiration achieving a TICI 3  Rt MCA revascularization.   PHYSICAL EXAM  Temp:  [96.6 F (35.9 C)-97.4 F (36.3 C)] 97.4 F (36.3 C) (12/19 0800) Pulse Rate:  [25-84] 76 (12/19 0800) Resp:  [17-29] 28 (12/19 0800) BP: (80-113)/(51-99) 102/55 (12/19 0800) SpO2:  [86 %-100 %] 100 % (12/19 0800) Arterial Line BP: (96-149)/(46-80) 133/55 (12/19 0800)  General - frail elderly Caucasian male in no apparent distress.  Ophthalmologic - fundi not visualized due to noncooperation.  Cardiovascular - Regular rhythm and rate, not in afib.  Neuro - awake alert, fluent language, however,  intermittently confused and incoherent. He is oriented to self, place and month and people, but not  to year and age. Able to name without difficulty, able to repeat simple sentences. Able to follow simple commands. No gaze deviation, no hemianopia. However, more right side preference and mild left sided neglect. Tracking on both sides, PERRL. Mild left facial droop. Tongue midline. Left UE 4/5. LLE proximal 4+/5, distal 5/5 with toe DF and PF. Sensation symmtrical subjectively. FTN intact on the right but dysmetria on the left. Gait not tested.  ASSESSMENT/PLAN Adam Leon is a 85 y.o. male with history of HLD, HTN, DM presenting after a fall with L sided weakness face, arm and leg.   Stroke: bilateral anterior and posterior infarcts with L ICA occlusion s/p IR w Shawn Route  revascularization, likely embolic d/t new AF w/ RVR and LV thrombus in the setting of undiagnosed recent MI  Code Stroke CT head No acute abnormality. pontiue hypodensity. ASPECTS 10.     CTA head & neck R ICA occlusion. Decreased R MCA flow w/ good cross-filling.  CT perfusion 13 mL core infarct R watershed w/ delayed perfusion R MCA d/t R ICA occlusion   Cerebral Angio occlusion intra and extracranial ICA with TICI3 reperfusion    MRI  Scattered small cerebrum and cerebellar bilateral infarcts (greatest R frontoparietal)  MRA  Limited unremarkable  Carotid Doppler  R 1-39% stenosis    2D Echo severe LV dysfunction < 20% w/ possible stress cardiomyopathy w/ apical ballooning and LV thrombus  LDL NTC due to TG 244 and low HDL < 10  direct LDL - 58.5  HgbA1c 8.8  IV heparin for VTE prophylaxis  aspirin 81 mg daily prior to admission, now on aspirin 325 mg daily and heparin IV. warfarin started 01/31/19  Therapy recommendations:  CIR  Disposition:  pending   PAF w/ RVR, new diagnosis  Home anticoagulation:  none   CHA2DS2-VASc Score = at least 6, ?2 oral anticoagulation recommended  Age in Years:  ?[age]    +2    Sex:  Male   0    Hypertension History:  yes   +1     Diabetes Mellitus:  yes   +1  Congestive Heart Failure History:  0  Vascular Disease History:  yes   +1     Stroke/TIA/Thromboembolism History:  yes   +2 . Placed on IV heparin also given LV clot . Started on amiodarone w/ bolus early am due to RVR . Plan AC at d/c  Abnormal EKG w/ ST elevation LV Systolic Dysunction, LV thrombus  Felt likely reflective of ICA occlusion w/ demand ischemia  EKG w/ concern for anterior MI  Trop 15519->15715->11893   2D Echo severe LV dysfunction < 20% w/ possible stress cardiomyopathy w/ apical ballooning and LV thrombus  ? Recent cardiac event w/ continued troponin elevation  No BB d/t low BP  BNP 4321.1  Cardiology Dr. Katrinka BlazingSmith on board   Hypotension Hx Hypertension  Home meds:  atenolol 25 daily, lisinopril 5  Low BP felt to be related to AF in setting of severe LV dysfunction  BP goal 120-140 by artery line  PRN levophed for BP goal  Stopped IVF d/t severe LV dysfunction . Long-term BP goal normotensive  Hyperlipidemia  Home meds:  pravachol 40  LDL NTC (TG 244 and HDL < 10), goal < 70  Direct LDL - 58.5  Statin held d/t elevated LFTs - pending - (see below)  Continue statin once LFTs improved  Diabetes type II Uncontrolled  Home meds:  Metformin 500 bid  HgbA1c 8.8, goal < 7.0  CBGs  SSI  Close PCP follow up  UGIB, resolved - ? Stress ulcer  Coffee ground color emesis - resolved   Off NG suction  PPI IV Q12h  On diet now  Other Stroke Risk Factors  Advanced age  Other Active Problems  Mild cognitive decline  Physical deconditioning over past 2 weeks going from independent walking to using a walker. Had ER eval 2 wks ago for fatigue and decreased appetite.   AKI 2.32->2.2->1.94->1.91->1.70->1.80  Leukocytosis 15.0-17.4-18.6-15.5->15.1  Acute blood loss anemia 12.9-13.6-11.8-11.3-10.6->10.6  Mild transaminitis AST 67-> 68->114  ALT  49->50 ALP 133->146->105  Direct Bilirubin 0.3  Hospital day # 3 Plan mobilize up in  bed.  Therapy consults.  Wean midodrine drip and start p.o. if okay with cardiology.  Start warfarin and continue heparin bridge till INR is therapeutic. This patient is critically ill due to stroke, ICA occlusion s/p IR, MI, LV thrombus, afib RVR and at significant risk of neurological worsening, death form heart failure, cardiac shock, recurrent stroke, hemorrhagic conversion, seizure. This patient's care requires constant monitoring of vital signs, hemodynamics, respiratory and cardiac monitoring, review of multiple databases, neurological assessment, discussion with family, other specialists and medical decision making of high complexity. I spent 35 minutes of neurocritical care time in the care of this patient.   Delia Heady, MD To contact Stroke Continuity provider, please refer to [email or web address]. After hours, contact General Neurology

## 2019-02-01 DIAGNOSIS — I480 Paroxysmal atrial fibrillation: Secondary | ICD-10-CM

## 2019-02-01 LAB — COMPREHENSIVE METABOLIC PANEL
ALT: 64 U/L — ABNORMAL HIGH (ref 0–44)
AST: 44 U/L — ABNORMAL HIGH (ref 15–41)
Albumin: 1.9 g/dL — ABNORMAL LOW (ref 3.5–5.0)
Alkaline Phosphatase: 111 U/L (ref 38–126)
Anion gap: 13 (ref 5–15)
BUN: 44 mg/dL — ABNORMAL HIGH (ref 8–23)
CO2: 17 mmol/L — ABNORMAL LOW (ref 22–32)
Calcium: 7.9 mg/dL — ABNORMAL LOW (ref 8.9–10.3)
Chloride: 106 mmol/L (ref 98–111)
Creatinine, Ser: 1.78 mg/dL — ABNORMAL HIGH (ref 0.61–1.24)
GFR calc Af Amer: 39 mL/min — ABNORMAL LOW (ref >60)
GFR calc non Af Amer: 34 mL/min — ABNORMAL LOW (ref >60)
Glucose, Bld: 131 mg/dL — ABNORMAL HIGH (ref 70–99)
Potassium: 3.5 mmol/L (ref 3.5–5.1)
Sodium: 136 mmol/L (ref 135–145)
Total Bilirubin: 0.7 mg/dL (ref 0.3–1.2)
Total Protein: 5.2 g/dL — ABNORMAL LOW (ref 6.5–8.1)

## 2019-02-01 LAB — CBC
HCT: 31.8 % — ABNORMAL LOW (ref 39.0–52.0)
Hemoglobin: 10.5 g/dL — ABNORMAL LOW (ref 13.0–17.0)
MCH: 27.6 pg (ref 26.0–34.0)
MCHC: 33 g/dL (ref 30.0–36.0)
MCV: 83.7 fL (ref 80.0–100.0)
Platelets: 424 10*3/uL — ABNORMAL HIGH (ref 150–400)
RBC: 3.8 MIL/uL — ABNORMAL LOW (ref 4.22–5.81)
RDW: 13.7 % (ref 11.5–15.5)
WBC: 12.1 10*3/uL — ABNORMAL HIGH (ref 4.0–10.5)
nRBC: 0 % (ref 0.0–0.2)

## 2019-02-01 LAB — GLUCOSE, CAPILLARY
Glucose-Capillary: 121 mg/dL — ABNORMAL HIGH (ref 70–99)
Glucose-Capillary: 136 mg/dL — ABNORMAL HIGH (ref 70–99)
Glucose-Capillary: 216 mg/dL — ABNORMAL HIGH (ref 70–99)
Glucose-Capillary: 224 mg/dL — ABNORMAL HIGH (ref 70–99)
Glucose-Capillary: 141 mg/dL — ABNORMAL HIGH (ref 70–99)

## 2019-02-01 LAB — BRAIN NATRIURETIC PEPTIDE: B Natriuretic Peptide: 2302.2 pg/mL — ABNORMAL HIGH (ref 0.0–100.0)

## 2019-02-01 LAB — PROTIME-INR
INR: 2.2 — ABNORMAL HIGH (ref 0.8–1.2)
Prothrombin Time: 24 seconds — ABNORMAL HIGH (ref 11.4–15.2)

## 2019-02-01 LAB — HEPARIN LEVEL (UNFRACTIONATED): Heparin Unfractionated: 0.2 IU/mL — ABNORMAL LOW (ref 0.30–0.70)

## 2019-02-01 MED ORDER — ASPIRIN 81 MG PO CHEW
81.0000 mg | CHEWABLE_TABLET | Freq: Every day | ORAL | Status: DC
  Administered 2019-02-02 – 2019-02-05 (×4): 81 mg via ORAL
  Filled 2019-02-01 (×5): qty 1

## 2019-02-01 MED ORDER — AMIODARONE HCL 200 MG PO TABS
200.0000 mg | ORAL_TABLET | Freq: Two times a day (BID) | ORAL | Status: DC
  Administered 2019-02-01 – 2019-02-05 (×10): 200 mg via ORAL
  Filled 2019-02-01 (×12): qty 1

## 2019-02-01 NOTE — Progress Notes (Signed)
[place] for Heparin/Coumadin Indication: ACS s/p carotid embolectomy and LV thrombus  No Known Allergies  Patient Measurements: Height: 6' (182.9 cm) Weight: 195 lb (88.5 kg) IBW/kg (Calculated) : 77.6  Vital Signs: Temp: 97.2 F (36.2 C) (12/20 0400) Temp Source: Oral (12/20 0400) BP: 94/59 (12/20 0700) Pulse Rate: 57 (12/20 0700)  Labs: Recent Labs    01/29/19 1107 01/29/19 1336 01/30/19 0334 01/30/19 1125 01/30/19 1729 01/31/19 0637 02/01/19 0547  HGB  --   --  10.6*  --   --  10.6* 10.5*  HCT  --   --  31.6*  --   --  32.1* 31.8*  PLT  --    < > 358  --   --  409* 424*  LABPROT  --   --   --   --   --  17.2* 24.0*  INR  --   --   --   --   --  1.4* 2.2*  HEPARINUNFRC  --    < > 0.48 0.30  --  0.37 0.20*  CREATININE  --    < > 1.91*  --  1.70* 1.80* 1.78*  TROPONINIHS 11,893*  --   --   --   --   --   --    < > = values in this interval not displayed.    Estimated Creatinine Clearance: 32.7 mL/min (A) (by C-G formula based on SCr of 1.78 mg/dL (H)).    Assessment: 85 y.o. male with EKG changes, elevated cardiac markers and LV thrombus s/p CVA and carotid thrombectomy continues on IV heparin. Consulted to start transition to Coumadin therapy.  -heparin level below goal this am.  Need to continue overlap od heparin and Coumadin for 3 to 5 days despite INR of 2.2.  - Because there was such a large jump in the INR yesterday will hold warfarin tonight and reassess tomorrow  Goal of Therapy:  Heparin level 0.3-0.5 units/mL Monitor platelets by anticoagulation protocol: Yes   Plan:  -Increase heparin drip to 1300 units/hr -Hold Coumadin tonight -Daily heparin level, INR and CBC  Alanda Slim, PharmD, [facility] Clinical Pharmacist Please see AMION for all Pharmacists' Contact Phone Numbers 02/01/2019, 7:57 AM

## 2019-02-01 NOTE — Progress Notes (Signed)
Progress Note  Patient Name: Adam Leon Date of Encounter: 02/01/2019  Primary Cardiologist: new, Dr. Katrinka Blazing  Subjective   Talking more today.  Feels well.  No chest pain.  The patient's daughter is here and had discussion with her.  Inpatient Medications    Scheduled Meds: . aspirin  325 mg Oral Daily  . carvedilol  3.125 mg Oral BID WC  . chlorhexidine  15 mL Mouth Rinse BID  . Chlorhexidine Gluconate Cloth  6 each Topical Daily  . insulin aspart  0-15 Units Subcutaneous Q4H  . mouth rinse  15 mL Mouth Rinse q12n4p  . pantoprazole (PROTONIX) IV  40 mg Intravenous Q12H  . sodium bicarbonate  650 mg Oral TID  . sodium chloride flush  10-40 mL Intracatheter Q12H  . Warfarin - Pharmacist Dosing Inpatient   Does not apply q1800   Continuous Infusions: . amiodarone 30 mg/hr (02/01/19 1000)  . heparin 1,300 Units/hr (02/01/19 1013)   PRN Meds: acetaminophen **OR** acetaminophen (TYLENOL) oral liquid 160 mg/5 mL **OR** acetaminophen, iohexol, ondansetron (ZOFRAN) IV, senna-docusate, sodium chloride flush   Vital Signs    Vitals:   02/01/19 0900 02/01/19 1000 02/01/19 1100 02/01/19 1200  BP: (!) 104/56 (!) 87/54 (!) 91/56 (!) 89/57  Pulse: 63 (!) 56 (!) 56 (!) 56  Resp: 18 17 20 20   Temp:      TempSrc:      SpO2: 98% 93% 96% 95%  Weight:      Height:        Intake/Output Summary (Last 24 hours) at 02/01/2019 1245 Last data filed at 02/01/2019 1059 Gross per 24 hour  Intake 640.63 ml  Output 525 ml  Net 115.63 ml    I/O since admission: +3831  Filed Weights   01/28/19 2155  Weight: 88.5 kg    Telemetry    Sinus at 60 - Personally Reviewed  ECG   01/31/2019 ECG (independently read by me): Normal sinus rhythm at 68 bpm.  QS complex V1 through V3 with residual ST elevation V2 through V5.  Right bundle branch block with repolarization changes.  Prolonged QTc interval    01/30/2019 ECG (independently read by me): Normal sinus rhythm at 80 bpm.  ST  elevation V2 through V5 consistent with anterior STEMI, incomplete left bundle branch block.  Physical Exam   BP (!) 89/57   Pulse (!) 56   Temp (!) 97.2 F (36.2 C) (Oral)   Resp 20   Ht 6' (1.829 m)   Wt 88.5 kg   SpO2 95%   BMI 26.45 kg/m    Arterial line BP 110/50  General: Alert, oriented, no distress.  Skin: normal turgor, no rashes, warm and dry HEENT: Normocephalic, atraumatic. Pupils equal round and reactive to light; sclera anicteric; extraocular muscles intact;  Nose without nasal septal hypertrophy Mouth/Parynx benign;  Neck: No JVD, no carotid bruits; normal carotid upstroke Lungs: clear to ausculatation and percussion; no wheezing or rales Chest wall: without tenderness to palpitation Heart: PMI not displaced, RRR, s1 s2 normal, 1/6 systolic murmur, no diastolic murmur, no rubs, gallops, thrills, or heaves Abdomen: soft, nontender; no hepatosplenomehaly, BS+; abdominal aorta nontender and not dilated by palpation. Back: no CVA tenderness Pulses 2+ Musculoskeletal: full range of motion, normal strength, no joint deformities Extremities: no clubbing cyanosis or edema, Homan's sign negative  Neurologic: Mild residual left hemiparesis Psychologic: Normal mood and affect   Labs    Chemistry Recent Labs  Lab 01/29/19 0240 01/29/19 1336 01/30/19 1729  01/31/19 0637 02/01/19 0547  NA 130*  --  137 137 136  K 4.0  --  3.9 3.6 3.5  CL 103   < > 109 106 106  CO2 12*   < > 15* 16* 17*  GLUCOSE 285*   < > 179* 151* 131*  BUN 47*   < > 50* 50* 44*  CREATININE 1.94*   < > 1.70* 1.80* 1.78*  CALCIUM 7.4*   < > 8.3* 8.0* 7.9*  PROT 5.4*  --   --  5.4* 5.2*  ALBUMIN 2.0*  --   --  2.0* 1.9*  AST 68*  --   --  114* 44*  ALT 50*  --   --  105* 64*  ALKPHOS 146*  --   --  118 111  BILITOT 1.6*  --   --  0.9 0.7  GFRNONAA 30*   < > 36* 33* 34*  GFRAA 35*   < > 41* 39* 39*  ANIONGAP 15   < > 13 15 13    < > = values in this interval not displayed.     Hematology  Recent Labs  Lab 01/30/19 0334 01/31/19 0637 02/01/19 0547  WBC 15.5* 15.1* 12.1*  RBC 3.80* 3.85* 3.80*  HGB 10.6* 10.6* 10.5*  HCT 31.6* 32.1* 31.8*  MCV 83.2 83.4 83.7  MCH 27.9 27.5 27.6  MCHC 33.5 33.0 33.0  RDW 13.1 13.6 13.7  PLT 358 409* 424*      Lab 01/28/19 2120 01/28/19 2313 01/29/19 1107  TROPONINIHS 15,519* 16,10915,715* 11,893*           Cardiac EnzymesNo results for input(s): TROPONINI in the last 168 hours. No results for input(s): TROPIPOC in the last 168 hours.   BNP Recent Labs  Lab 01/29/19 1107 02/01/19 0547  BNP 4,321.1* 2,302.2*     DDimer No results for input(s): DDIMER in the last 168 hours.   Lipid Panel     Component Value Date/Time   CHOL 123 01/29/2019 0222   TRIG 244 (H) 01/29/2019 0222   HDL <10 (L) 01/29/2019 0222   CHOLHDL NOT CALCULATED 01/29/2019 0222   VLDL 49 (H) 01/29/2019 0222   LDLCALC NOT CALCULATED 01/29/2019 0222   LDLDIRECT 58.5 01/30/2019 1125     Radiology    No results found.  Cardiac Studies   CT head 12/16 > no acute infarct. CTA head / neck 12/16 > 13ml core infarct on right.  Occlusion of right ICA.  MRI/MRA brain 12/17   IMPRESSION: 1. Severely motion degraded examination. 2. Scattered small acute to early subacute infarcts in the cerebrum and cerebellum bilaterally, greatest in the right frontoparietal region. 3. Limited head MRA due to motion with patency of the large intracranial arteries as above.  Echo 12/17  1. Left ventricular ejection fraction, by visual estimation, is <20%. The left ventricle has severely decreased function. Left ventricular septal wall thickness was mildly increased. Mildly increased left ventricular posterior wall thickness. 2. There is apical ballooning with akinesis of the entire apical wall. There is akinesis of the mid infero and anteroseptum. There is akinesis of the mid and apical inferior, anterior and lateral walls and akineseis of the apical inferolateral  wall. 3. Moderate, fixed thrombus on the apical wall of the left ventricle. 4. Definity contrast agent was given IV to delineate the left ventricular endocardial borders. 5. Global right ventricle has normal systolic function.The right ventricular size is normal. No increase in right ventricular wall thickness. 6. Left atrial  size was normal. 7. Right atrial size was normal. 8. The mitral valve is normal in structure. Trivial mitral valve regurgitation. No evidence of mitral stenosis. 9. The tricuspid valve is normal in structure. Tricuspid valve regurgitation is mild. 10. The aortic valve is tricuspid. Aortic valve regurgitation is not visualized. No evidence of aortic valve sclerosis or stenosis. 11. The pulmonic valve was normal in structure. Pulmonic valve regurgitation is not visualized. 12. Normal pulmonary artery systolic pressure. 13. The inferior vena cava is normal in size with greater than 50% respiratory variability, suggesting right atrial pressure of 3 mmHg. 14. Left ventricular diastolic parameters are consistent with Grade I diastolic dysfunction (impaired relaxation).   Patient Profile    85 y.o. male with hyperlipidemia, hypertension, diabetes, dementia who presented with who presented with right-sided gaze preference and left-sided hemiplegia.  Subsequently underwent thrombectomy of left carotid/internal carotid.  Cardiac high-sensitivity troponins were elevated on admission and EKG demonstrated anterior ST elevation.  ST elevation has persisted, enzymes are decreasing, thrombus in LV apex, felt to be the source of the patient's stroke on an embolic basis.  Assessment & Plan    1.  Probable anterolateral MI prior to admission resulting in marked wall motion abnormality with apical thrombus and EF less than 20%.  Troponins are on the downturn.  ECG with ST elevation anterolaterally.  Repeat ECG shows Q waves V1 through V3 with persistent ST elevation anterolaterally.  Depending upon renal function and future course and symptom development, at some point may consider cardiac cath, but not presently.  Discussed with Dr. Leonie Man, will change aspirin from 325 mg down to 81 mg  with plan for anticoagulation  2.  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  Status post recanalization.  Residual left hemiparesis.  Warfarin started for anticoagulation, continue heparin until therapeutic.  3.  Ischemic cardiomyopathy with EF estimate on echo less than 20%.  Carvedilol was instituted yesterday at 3.125 mg twice a day.  Resting pulse now in the 60s.  There is a disparity between 59-year-old blood pressure in blood pressure cuff.   Will ultimately need ARB therapy but with acute kidney injury and elevated creatinines will hold off on ARB therapy presently.  Initial BNP 4321, elevated but slightly improved at 2302.  4.  AKI: Creatinine 2.32 >>> 1.8 > 1.78.  5.  Atrial fibrillation: Converted to sinus rhythm on IV amiodarone which was started 01/30/2019.  Maintaining sinus rhythm.  Will DC IV amiodarone today and transition to oral therapy 200 mg twice daily initially.  6.  LFT elevation: May be secondary to MI with transient hepatic congestion.  Must watch closely with amiodarone.  Since LFTs are elevated , but significantly improved today at  AST/ALT 44/65.  7.  Hyperlipidemia: Target LDL less than 70.  Initiate statin therapy when LFTs normalize.   If transferred out of 4 N, will need cardiac telemetry on the floor.   Signed, Troy Sine, MD, [facility] 02/01/2019, 12:45 PM

## 2019-02-01 NOTE — Progress Notes (Signed)
  Speech Language Pathology Treatment: Dysphagia;Cognitive-Linquistic  Patient Details Name: Adam Leon MRN: [id number] DOB: 09/03/1930 Today's Date: 02/01/2019 Time: 2202-5427 SLP Time Calculation (min) (ACUTE ONLY): 23 min  Assessment / Plan / Recommendation Clinical Impression  Patient seen for skilled dysphagia therapy as well as cognitive therapy. Pt arouses but seems tired/weak. Observed trials of crackers and juice (thin) and patient presented with no s/s of aspiration. Prolonged mastication and bolus manipulation, but good oral clearance with liquid wash following eat solid.  Continued clear vocal quality throughout session. Recommend continuing Dys 2, thin liquid diet due to continued oral impairments.   SLP oriented pt to date, place and current situation. He acknowledge understanding, however with a time lapse of 5 min., pt unable to recall only current place even with written cues. Pt able to make request for his own needs/wants during session. Continue Cognitive therapy to address memory and awareness.    HPI HPI: Adam Leon is a 85 y.o. male who has a PMH including but not limited to syncope, HLD.  He presented to [facility] ED 12/16 with left hemiplegia.  CTA showed R ICA occlusion.  He was subsequently taken to IR for arteriogram with complete revascularization of occluded extracranial and intracranial R ICA. CXR 12/17: interstitial edema.  CT 12/16: R MCA infarct in deep white matter      SLP Plan  Continue with current plan of care       Recommendations  Diet recommendations: Dysphagia 2 (fine chop);Thin liquid Liquids provided via: Cup;Straw Medication Administration: Whole meds with puree Supervision: Patient able to self feed;Intermittent supervision to cue for compensatory strategies Compensations: Slow rate;Small sips/bites;Follow solids with liquid Postural Changes and/or Swallow Maneuvers: Seated upright 90 degrees;Upright 30-60 min after meal                 Oral Care Recommendations: Oral care BID SLP Visit Diagnosis: Cognitive communication deficit (C62.376) Plan: Continue with current plan of care       GO                Wynelle Bourgeois., MA, CCC-SLP 02/01/2019, 12:00 PM

## 2019-02-01 NOTE — Progress Notes (Addendum)
STROKE TEAM PROGRESS NOTE   INTERVAL HISTORY Pt lying in bed comfortably, RN at bedside. He remains  on amiodarone drip but cardiology plan to stop it and changed to p.o. today.. BP on the low end.Cardiology started him on low dose carvedilol. Pt right sided weakness much improved.Will plan to transfer out of ICU when off amiodarone drip. On heparin and warfarin and INR is therepeautic.  Vitals:   02/01/19 0400 02/01/19 0500 02/01/19 0600 02/01/19 0700  BP: (!) 91/54 (!) 95/57 (!) 99/58 (!) 94/59  Pulse: (!) 56 (!) 57 61 (!) 57  Resp: 19 19 19 19   Temp: (!) 97.2 F (36.2 C)     TempSrc: Oral     SpO2: 95% 95% 94% 95%  Weight:      Height:        CBC:  Recent Labs  Lab 01/29/19 0240 01/29/19 1355 01/31/19 0637 02/01/19 0547  WBC 17.4* 18.6* 15.1* 12.1*  NEUTROABS 14.5* 14.8*  --   --   HGB 11.8* 11.3* 10.6* 10.5*  HCT 35.8* 33.2* 32.1* 31.8*  MCV 83.1 81.4 83.4 83.7  PLT 406* 428* 409* 424*    Basic Metabolic Panel:  Recent Labs  Lab 01/30/19 1125 01/31/19 0637 02/01/19 0547  NA  --  137 136  K  --  3.6 3.5  CL  --  106 106  CO2  --  16* 17*  GLUCOSE  --  151* 131*  BUN  --  50* 44*  CREATININE  --  1.80* 1.78*  CALCIUM  --  8.0* 7.9*  MG 2.0  --   --    Lipid Panel:     Component Value Date/Time   CHOL 123 01/29/2019 0222   TRIG 244 (H) 01/29/2019 0222   HDL <10 (L) 01/29/2019 0222   CHOLHDL NOT CALCULATED 01/29/2019 0222   VLDL 49 (H) 01/29/2019 0222   LDLCALC NOT CALCULATED 01/29/2019 0222   HgbA1c:  Lab Results  Component Value Date   HGBA1C 8.8 (H) 01/29/2019   Urine Drug Screen: No results found for: LABOPIA, COCAINSCRNUR, LABBENZ, AMPHETMU, THCU, LABBARB  Alcohol Level     Component Value Date/Time   ETH <10 01/28/2019 2120    IMAGING  No results found. Cerebral Angiogram S/P bilateral common carotid and RT Vert arteriogram followed by complete revascularization of occluded extracranial and intracranial RT ICA with x 2 passes with 72mmx  40 mm solitaireX retriever device and penumbra aspiration achieving a TICI 3  Rt MCA revascularization.   PHYSICAL EXAM  Temp:  [97 F (36.1 C)-97.6 F (36.4 C)] 97.2 F (36.2 C) (12/20 0400) Pulse Rate:  [55-76] 57 (12/20 0700) Resp:  [17-28] 19 (12/20 0700) BP: (81-112)/(51-80) 94/59 (12/20 0700) SpO2:  [87 %-100 %] 95 % (12/20 0700) Arterial Line BP: (101-141)/(41-59) 118/50 (12/20 0700)  General - frail elderly Caucasian male in no apparent distress.  Ophthalmologic - fundi not visualized due to noncooperation.  Cardiovascular - Regular rhythm and rate, not in afib.  Neuro - awake alert, fluent language, however, intermittently confused and incoherent. He is oriented to self, place and month and people, but not  to year and age. Able to name without difficulty, able to repeat simple sentences. Able to follow simple commands. No gaze deviation, no hemianopia. However, more right side preference and mild left sided neglect. Tracking on both sides, PERRL. Mild left facial droop. Tongue midline. Left UE 4/5. LLE proximal 4+/5, distal 5/5 with toe DF and PF. Sensation symmtrical subjectively. FTN  intact on the right but dysmetria on the left. Gait not tested.  ASSESSMENT/PLAN Mr. Mayer Vondrak is a 85 y.o. male with history of HLD, HTN, DM presenting after a fall with L sided weakness face, arm and leg.   Stroke: bilateral anterior and posterior infarcts with L ICA occlusion s/p IR w TICI3 revascularization, likely embolic d/t new AF w/ RVR and LV thrombus in the setting of undiagnosed recent MI  Code Stroke CT head No acute abnormality. pontiue hypodensity. ASPECTS 10.     CTA head & neck R ICA occlusion. Decreased R MCA flow w/ good cross-filling.  CT perfusion 13 mL core infarct R watershed w/ delayed perfusion R MCA d/t R ICA occlusion   Cerebral Angio occlusion intra and extracranial ICA with TICI3 reperfusion    MRI  Scattered small cerebrum and cerebellar bilateral  infarcts (greatest R frontoparietal)  MRA  Limited unremarkable  Carotid Doppler  R 1-39% stenosis    2D Echo severe LV dysfunction < 20% w/ possible stress cardiomyopathy w/ apical ballooning and LV thrombus  LDL NTC due to TG 244 and low HDL < 10  Direct LDL - 58.5  HgbA1c 8.8  IV heparin for VTE prophylaxis  aspirin 81 mg daily prior to admission, now on aspirin 325 mg daily and heparin IV. warfarin started 01/31/19  Therapy recommendations:  CIR  Disposition:  pending   PAF w/ RVR, new diagnosis  Home anticoagulation:  none   CHA2DS2-VASc Score = at least 6, ?2 oral anticoagulation recommended  Age in Years:  ?25   +2    Sex:  Male   0    Hypertension History:  yes   +1     Diabetes Mellitus:  yes   +1  Congestive Heart Failure History:  0  Vascular Disease History:  yes   +1     Stroke/TIA/Thromboembolism History:  yes   +2 . Placed on IV heparin / coumadin given LV clot and atrial fibrillation . Started on amiodarone w/ bolus early am due to RVR -> Per Dr Tresa Endo - plan to convert to PO Amio on 02/01/19 . Converted to sinus rhythm    Abnormal EKG w/ ST elevation LV Systolic Dysunction, LV thrombus  Felt likely reflective of ICA occlusion w/ demand ischemia  EKG w/ concern for anterior MI  Trop 15519->15715->11893   2D Echo severe LV dysfunction < 20% w/ possible stress cardiomyopathy w/ apical ballooning and LV thrombus  No BB d/t low BP  BNP 4321.1->2,302  Cardiology Dr. Katrinka Blazing on board   Probable anterolateral MI prior to admission per cardiology  Hypotension Hx Hypertension  Home meds:  atenolol 25 daily, lisinopril 5  Low BP felt to be related to AF in setting of severe LV dysfunction  BP goal 120-140 by artery line  PRN levophed for BP goal  Stopped IVF d/t severe LV dysfunction . Long-term BP goal normotensive  Hyperlipidemia  Home meds:  pravachol 40  LDL NTC (TG 244 and HDL < 10), goal < 70  Direct LDL - 58.5  Statin held d/t  elevated LFTs - improving - (see below)  Continue statin once LFTs improved  Diabetes type II Uncontrolled  Home meds:  Metformin 500 bid  HgbA1c 8.8, goal < 7.0  CBGs  SSI  Close PCP follow up  UGIB, resolved - ? Stress ulcer  Coffee ground color emesis - resolved   Off NG suction  PPI IV Q12h  On diet now  Other Stroke Risk  Factors  Advanced age  Other Active Problems  Mild cognitive decline - agitation early AM 12/19  Physical deconditioning over past 2 weeks going from independent walking to using a walker. Had ER eval 2 wks ago for fatigue and decreased appetite.   AKI 2.32->2.2->1.94->1.91->1.70->1.80->1.78  Leukocytosis 15.0-17.4-18.6-15.5->15.1->12.1  Acute blood loss anemia 12.9-13.6-11.8-11.3-10.6->10.6->10.5  Mild transaminitis ; AST 67-> 68->114->44 ALT 49->50 ALP 133->146->105->64 - improving  CCM signed off 01/31/19  Anticoagulation - Heparin / Coumadin per pharmacy for afib and thrombus - INR - 1.4->2.2  Moderate, fixed thrombus on the apical wall of the left ventricle by echo 01/29/19  Potassium - 3.5  Hospital day # 4 Plan mobilize up in bed.  Therapy consults.  Wean amidodrine drip . if okay with cardiology. Continuet warfarin and continue heparin overlap x 3 days per pharmacy.. I had a long discussion with patient and her daughter at the bedside and answered questions about his condition.  Hopefully transfer to rehab in a few days and he may need elective cardiac catheterization and revascularization procedure if necessary after he improves.  Reduce dose of aspirin from 325 to 81 mg daily.  Discussed with Dr. Tresa EndoKelly cardiologist This patient is critically ill due to stroke, ICA occlusion s/p IR, MI, LV thrombus, afib RVR and at significant risk of neurological worsening, death form heart failure, cardiac shock, recurrent stroke, hemorrhagic conversion, seizure. This patient's care requires constant monitoring of vital signs, hemodynamics,  respiratory and cardiac monitoring, review of multiple databases, neurological assessment, discussion with family, other specialists and medical decision making of high complexity. I spent 32 minutes of neurocritical care time in the care of this patient.  Delia HeadyPramod Korry Dalgleish, MD  To contact Stroke Continuity provider, please refer to [email or web address]. After hours, contact General Neurology

## 2019-02-02 ENCOUNTER — Inpatient Hospital Stay (HOSPITAL_COMMUNITY): Payer: Medicare HMO

## 2019-02-02 LAB — COMPREHENSIVE METABOLIC PANEL
ALT: 49 U/L — ABNORMAL HIGH (ref 0–44)
AST: 33 U/L (ref 15–41)
Albumin: 1.9 g/dL — ABNORMAL LOW (ref 3.5–5.0)
Alkaline Phosphatase: 103 U/L (ref 38–126)
Anion gap: 13 (ref 5–15)
BUN: 36 mg/dL — ABNORMAL HIGH (ref 8–23)
CO2: 20 mmol/L — ABNORMAL LOW (ref 22–32)
Calcium: 7.7 mg/dL — ABNORMAL LOW (ref 8.9–10.3)
Chloride: 104 mmol/L (ref 98–111)
Creatinine, Ser: 1.57 mg/dL — ABNORMAL HIGH (ref 0.61–1.24)
GFR calc Af Amer: 46 mL/min — ABNORMAL LOW (ref >60)
GFR calc non Af Amer: 39 mL/min — ABNORMAL LOW (ref >60)
Glucose, Bld: 142 mg/dL — ABNORMAL HIGH (ref 70–99)
Potassium: 3.6 mmol/L (ref 3.5–5.1)
Sodium: 137 mmol/L (ref 135–145)
Total Bilirubin: 0.8 mg/dL (ref 0.3–1.2)
Total Protein: 5 g/dL — ABNORMAL LOW (ref 6.5–8.1)

## 2019-02-02 LAB — CBC
HCT: 33.5 % — ABNORMAL LOW (ref 39.0–52.0)
Hemoglobin: 10.7 g/dL — ABNORMAL LOW (ref 13.0–17.0)
MCH: 27.6 pg (ref 26.0–34.0)
MCHC: 31.9 g/dL (ref 30.0–36.0)
MCV: 86.6 fL (ref 80.0–100.0)
Platelets: 444 10*3/uL — ABNORMAL HIGH (ref 150–400)
RBC: 3.87 MIL/uL — ABNORMAL LOW (ref 4.22–5.81)
RDW: 14.1 % (ref 11.5–15.5)
WBC: 11.8 10*3/uL — ABNORMAL HIGH (ref 4.0–10.5)
nRBC: 0.2 % (ref 0.0–0.2)

## 2019-02-02 LAB — PROTIME-INR
INR: 2.8 — ABNORMAL HIGH (ref 0.8–1.2)
Prothrombin Time: 29.8 seconds — ABNORMAL HIGH (ref 11.4–15.2)

## 2019-02-02 LAB — GLUCOSE, CAPILLARY
Glucose-Capillary: 134 mg/dL — ABNORMAL HIGH (ref 70–99)
Glucose-Capillary: 159 mg/dL — ABNORMAL HIGH (ref 70–99)
Glucose-Capillary: 134 mg/dL — ABNORMAL HIGH (ref 70–99)
Glucose-Capillary: 158 mg/dL — ABNORMAL HIGH (ref 70–99)
Glucose-Capillary: 162 mg/dL — ABNORMAL HIGH (ref 70–99)
Glucose-Capillary: 93 mg/dL (ref 70–99)

## 2019-02-02 LAB — HEPARIN LEVEL (UNFRACTIONATED)
Heparin Unfractionated: 0.17 IU/mL — ABNORMAL LOW (ref 0.30–0.70)
Heparin Unfractionated: 0.12 IU/mL — ABNORMAL LOW (ref 0.30–0.70)

## 2019-02-02 MED ORDER — WARFARIN SODIUM 1 MG PO TABS
0.5000 mg | ORAL_TABLET | Freq: Once | ORAL | Status: AC
  Administered 2019-02-02: 0.5 mg via ORAL
  Filled 2019-02-02: qty 0.5
  Filled 2019-02-02: qty 1

## 2019-02-02 MED ORDER — ENSURE ENLIVE PO LIQD
237.0000 mL | Freq: Two times a day (BID) | ORAL | Status: DC
  Administered 2019-02-03 – 2019-02-10 (×11): 237 mL via ORAL

## 2019-02-02 MED ORDER — POLYETHYLENE GLYCOL 3350 17 G PO PACK
17.0000 g | PACK | Freq: Every day | ORAL | Status: DC | PRN
  Administered 2019-02-02: 17 g via ORAL
  Filled 2019-02-02: qty 1

## 2019-02-02 NOTE — Progress Notes (Signed)
Attempted giving 10am aspirin with apple sauce per speech recommendation- pt had a hard time swallowing pill, seemed as though it was stuck. Attempted to give applesauce and thin liquids to follow. Speech is at bedside at this time. Pt to be NPO for now until further eval of swallowing. Family, pharmacy, and MD Leonie Man are aware.

## 2019-02-02 NOTE — Progress Notes (Signed)
Inpatient Rehabilitation Admissions Coordinator  I me with patient at bedside with his daughter. We discussed goals and expectations of an inpt rehab admit and they prefer CIR. I will begin insurance authorization with Bernadene Person for a possible admit pending insurance approval.   Danne Baxter, RN, MSN Rehab Admissions Coordinator [phone] 02/02/2019 2:34 PM

## 2019-02-02 NOTE — Discharge Instructions (Addendum)
Acute Kidney Injury, Adult  Acute kidney injury is a sudden worsening of kidney function. The kidneys are organs that have several jobs. They filter the blood to remove waste products and extra fluid. They also maintain a healthy balance of minerals and hormones in the body, which helps control blood pressure and keep bones strong. With this condition, your kidneys do not do their jobs as well as they should. This condition ranges from mild to severe. Over time it may develop into long-lasting (chronic) kidney disease. Early detection and treatment may prevent acute kidney injury from developing into a chronic condition. What are the causes? Common causes of this condition include:  A problem with blood flow to the kidneys. This may be caused by: ? Low blood pressure (hypotension) or shock. ? Blood loss. ? Heart and blood vessel (cardiovascular) disease. ? Severe burns. ? Liver disease.  Direct damage to the kidneys. This may be caused by: ? Certain medicines. ? A kidney infection. ? Poisoning. ? Being around or in contact with toxic substances. ? A surgical wound. ? A hard, direct hit to the kidney area.  A sudden blockage of urine flow. This may be caused by: ? Cancer. ? Kidney stones. ? An enlarged prostate in males. What are the signs or symptoms? Symptoms of this condition may not be obvious until the condition becomes severe. Symptoms of this condition can include:  Tiredness (lethargy), or difficulty staying awake.  Nausea or vomiting.  Swelling (edema) of the face, legs, ankles, or feet.  Problems with urination, such as: ? Abdominal pain, or pain along the side of your stomach (flank). ? Decreased urine production. ? Decrease in the force of urine flow.  Muscle twitches and cramps, especially in the legs.  Confusion or trouble concentrating.  Loss of appetite.  Fever. How is this diagnosed? This condition may be diagnosed with tests, including:  Blood  tests.  Urine tests.  Imaging tests.  A test in which a sample of tissue is removed from the kidneys to be examined under a microscope (kidney biopsy). How is this treated? Treatment for this condition depends on the cause and how severe the condition is. In mild cases, treatment may not be needed. The kidneys may heal on their own. In more severe cases, treatment will involve:  Treating the cause of the kidney injury. This may involve changing any medicines you are taking or adjusting your dosage.  Fluids. You may need specialized IV fluids to balance your body's needs.  Having a catheter placed to drain urine and prevent blockages.  Preventing problems from occurring. This may mean avoiding certain medicines or procedures that can cause further injury to the kidneys. In some cases treatment may also require:  A procedure to remove toxic wastes from the body (dialysis or continuous renal replacement therapy - CRRT).  Surgery. This may be done to repair a torn kidney, or to remove the blockage from the urinary system. Follow these instructions at home: Medicines  Take over-the-counter and prescription medicines only as told by your health care provider.  Do not take any new medicines without your health care provider's approval. Many medicines can worsen your kidney damage.  Do not take any vitamin and mineral supplements without your health care provider's approval. Many nutritional supplements can worsen your kidney damage. Lifestyle  If your health care provider prescribed changes to your diet, follow them. You may need to decrease the amount of protein you eat.  Achieve and maintain a  healthy weight. If you need help with this, ask your health care provider.  Start or continue an exercise plan. Try to exercise at least 30 minutes a day, 5 days a week.  Do not use any tobacco products, such as cigarettes, chewing tobacco, and e-cigarettes. If you need help quitting, ask your  health care provider. General instructions  Keep track of your blood pressure. Report changes in your blood pressure as told by your health care provider.  Stay up to date with immunizations. Ask your health care provider which immunizations you need.  Keep all follow-up visits as told by your health care provider. This is important. Where to find more information  American Association of Kidney Patients: BombTimer.gl  National Kidney Foundation: www.kidney.[place]: https://mathis.com/  Life Options Rehabilitation Program: ? www.lifeoptions.org ? www.kidneyschool.org Contact a health care provider if:  Your symptoms get worse.  You develop new symptoms. Get help right away if:  You develop symptoms of worsening kidney disease, which include: ? Headaches. ? Abnormally dark or light skin. ? Easy bruising. ? Frequent hiccups. ? Chest pain. ? Shortness of breath. ? End of menstruation in women. ? Seizures. ? Confusion or altered mental status. ? Abdominal or back pain. ? Itchiness.  You have a fever.  Your body is producing less urine.  You have pain or bleeding when you urinate. Summary  Acute kidney injury is a sudden worsening of kidney function.  Acute kidney injury can be caused by problems with blood flow to the kidneys, direct damage to the kidneys, and sudden blockage of urine flow.  Symptoms of this condition may not be obvious until it becomes severe. Symptoms may include edema, lethargy, confusion, nausea or vomiting, and problems passing urine.  This condition can usually be diagnosed with blood tests, urine tests, and imaging tests. Sometimes a kidney biopsy is done to diagnose this condition.  Treatment for this condition often involves treating the underlying cause. It is treated with fluids, medicines, dialysis, diet changes, or surgery. This information is not intended to replace advice given to you by your health care provider. Make  sure you discuss any questions you have with your health care provider. Document Released: 08/14/2010 Document Revised: 01/11/2017 Document Reviewed: 01/20/2016 Elsevier Patient Education  2020 [place] on my medicine - ELIQUIS (apixaban)   Why was Eliquis prescribed for you? Eliquis was prescribed for you to reduce the risk of a blood clot forming that can cause a stroke if you have a medical condition called atrial fibrillation (a type of irregular heartbeat).  What do You need to know about Eliquis ? Take your Eliquis TWICE DAILY - one tablet in the morning and one tablet in the evening with or without food. If you have difficulty swallowing the tablet whole please discuss with your pharmacist how to take the medication safely.  Take Eliquis exactly as prescribed by your doctor and DO NOT stop taking Eliquis without talking to the doctor who prescribed the medication.  Stopping may increase your risk of developing a stroke.  Refill your prescription before you run out.  After discharge, you should have regular check-up appointments with your healthcare provider that is prescribing your Eliquis.  In the future your dose may need to be changed if your kidney function or weight changes by a significant amount or as you get older.  What do you do if you miss a dose? If you miss a dose, take it as soon  as you remember on the same day and resume taking twice daily.  Do not take more than one dose of ELIQUIS at the same time to make up a missed dose.  Important Safety Information A possible side effect of Eliquis is bleeding. You should call your healthcare provider right away if you experience any of the following: ? Bleeding from an injury or your nose that does not stop. ? Unusual colored urine (red or dark brown) or unusual colored stools (red or black). ? Unusual bruising for unknown reasons. ? A serious fall or if you hit your head (even if there is no  bleeding).  Some medicines may interact with Eliquis and might increase your risk of bleeding or clotting while on Eliquis. To help avoid this, consult your healthcare provider or pharmacist prior to using any new prescription or non-prescription medications, including herbals, vitamins, non-steroidal anti-inflammatory drugs (NSAIDs) and supplements.  This website has more information on Eliquis (apixaban): http://www.eliquis.com/eliquis/home

## 2019-02-02 NOTE — Progress Notes (Signed)
Modified Barium Swallow Progress Note  Patient Details  Name: Adam Leon MRN: [id number] Date of Birth: 1931-02-10  Today's Date: 02/02/2019  Modified Barium Swallow completed.  Full report located under Chart Review in the Imaging Section.  Brief recommendations include the following:  Clinical Impression Pt seen in radiology for MBS to objectively assess swallow function and safety, based on clinical presentation at bedside. Pt was uprght in chair, awake and alert. He is hard of hearing. Pt was given trials of nectar thick liquid, thin liquid, puree, and solid textures. He was also given a barium tablet in water.    Orally, pt exhibited oral holding and piecemeal swallow, with premature spillage to the vallecular sinus.   Pharyngeal swallow is characterized by trigger of swallow reflex at the vallecular sinus across consistencies. Very trace subepiglottic penetration was noted during the swallow of thin and nectar thick liquids, however, penetrate cleared completely and spontaeously and was not aspirated. Mild lateral channel and vallecular residue was noted after the swallow on most consistencies, which cleared with second/dry swallow. Barium tablet cleared the oral cavity without delay, but was noted to pause in the vallecular sinus before clearing with second swallow. Esophageal sweep revealed it to be clear.   Recommend continuing with Dys 2 solids and thin liquids, meds either whole or crushed in puree (based on pt mentation, which is likely to vary due to dx dementia). Recommend assistance with feeding as needed, again based on pt mentation. Safe swallow precautions were reviewed with RN, and sent in written form with transport to be placed at [facility]. SLP will follow up to assess diet tolerance and continue education.   Swallow Evaluation Recommendations  SLP Diet Recommendations: Dysphagia 2 (Fine chop) solids;Thin liquid   Liquid Administration via: Cup;Straw   Medication  Administration: Other (Comment)   Supervision: Patient able to self feed;Staff to assist with self feeding;Full supervision/cueing for compensatory strategies   Compensations: Minimize environmental distractions;Slow rate;Small sips/bites;Multiple dry swallows after each bite/sip   Postural Changes: Remain semi-upright after after feeds/meals (Comment);Seated upright at 90 degrees   Oral Care Recommendations: Oral care QID  Enriqueta Shutter, [facility], [place] Pathologist Office: [phone] Pager: [phone]  Shonna Chock 02/02/2019,1:56 PM

## 2019-02-02 NOTE — Progress Notes (Addendum)
Progress Note  Patient Name: Adam Leon Date of Encounter: 02/02/2019  Primary Cardiologist: No primary care provider on file.   Subjective   Sitting up eating breakfast. No complications noted overnight.   Inpatient Medications    Scheduled Meds: . amiodarone  200 mg Oral BID  . aspirin  81 mg Oral Daily  . carvedilol  3.125 mg Oral BID WC  . chlorhexidine  15 mL Mouth Rinse BID  . Chlorhexidine Gluconate Cloth  6 each Topical Daily  . insulin aspart  0-15 Units Subcutaneous Q4H  . mouth rinse  15 mL Mouth Rinse q12n4p  . pantoprazole (PROTONIX) IV  40 mg Intravenous Q12H  . sodium bicarbonate  650 mg Oral TID  . sodium chloride flush  10-40 mL Intracatheter Q12H  . Warfarin - Pharmacist Dosing Inpatient   Does not apply q1800   Continuous Infusions:  PRN Meds: acetaminophen **OR** acetaminophen (TYLENOL) oral liquid 160 mg/5 mL **OR** acetaminophen, iohexol, ondansetron (ZOFRAN) IV, senna-docusate, sodium chloride flush   Vital Signs    Vitals:   02/02/19 0700 02/02/19 0749 02/02/19 0800 02/02/19 0900  BP: 96/64 92/61 95/61  (!) 90/56  Pulse: 65 65 66 63  Resp: 15  (!) 23 17  Temp:   (!) 97.3 F (36.3 C)   TempSrc:   Oral   SpO2: 96%  96% 97%  Weight:      Height:        Intake/Output Summary (Last 24 hours) at 02/02/2019 0933 Last data filed at 02/02/2019 0800 Gross per 24 hour  Intake 404.14 ml  Output 1000 ml  Net -595.86 ml   Last 3 Weights 01/28/2019  Weight (lbs) 195 lb  Weight (kg) 88.451 kg      Telemetry    SR - Personally Reviewed  ECG    No new tracing this morning.   Physical Exam  Pleasant older WM, sitting up in bed eating.  GEN: No acute distress.   Neck: No JVD Cardiac: RRR, no murmurs, rubs, or gallops.  Respiratory: Clear to auscultation bilaterally. GI: Soft, nontender, non-distended  MS: No edema; No deformity. Neuro:  Nonfocal  Psych: Normal affect   Labs    High Sensitivity Troponin:   Recent Labs  Lab  01/28/19 2120 01/28/19 2313 01/29/19 1107  TROPONINIHS 15,519* 15,715* 11,893*      Chemistry Recent Labs  Lab 01/31/19 0637 02/01/19 0547 02/02/19 0502  NA 137 136 137  K 3.6 3.5 3.6  CL 106 106 104  CO2 16* 17* 20*  GLUCOSE 151* 131* 142*  BUN 50* 44* 36*  CREATININE 1.80* 1.78* 1.57*  CALCIUM 8.0* 7.9* 7.7*  PROT 5.4* 5.2* 5.0*  ALBUMIN 2.0* 1.9* 1.9*  AST 114* 44* 33  ALT 105* 64* 49*  ALKPHOS 118 111 103  BILITOT 0.9 0.7 0.8  GFRNONAA 33* 34* 39*  GFRAA 39* 39* 46*  ANIONGAP 15 13 13      Hematology Recent Labs  Lab 01/31/19 0637 02/01/19 0547 02/02/19 0502  WBC 15.1* 12.1* 11.8*  RBC 3.85* 3.80* 3.87*  HGB 10.6* 10.5* 10.7*  HCT 32.1* 31.8* 33.5*  MCV 83.4 83.7 86.6  MCH 27.5 27.6 27.6  MCHC 33.0 33.0 31.9  RDW 13.6 13.7 14.1  PLT 409* 424* 444*    BNP Recent Labs  Lab 01/29/19 1107 02/01/19 0547  BNP 4,321.1* 2,302.2*     DDimer No results for input(s): DDIMER in the last 168 hours.   Radiology    No results found.  Cardiac  Studies   CT head 12/16 > no acute infarct. CTA head / neck 12/16 >66ml core infarct on right. Occlusion of right ICA.  MRI/MRAbrain 12/17  IMPRESSION: 1. Severely motion degraded examination. 2. Scattered small acute to early subacute infarcts in the cerebrum and cerebellum bilaterally, greatest in the right frontoparietal region. 3. Limited head MRA due to motion with patency of the large intracranial arteries as above.  Echo 12/17 1. Left ventricular ejection fraction, by visual estimation, is <20%. The left ventricle has severely decreased function. Left ventricular septal wall thickness was mildly increased. Mildly increased left ventricular posterior wall thickness. 2. There is apical ballooning with akinesis of the entire apical wall. There is akinesis of the mid infero and anteroseptum. There is akinesis of the mid and apical inferior, anterior and lateral walls and akineseis of the apical  inferolateral wall. 3. Moderate, fixed thrombus on the apical wall of the left ventricle. 4. Definity contrast agent was given IV to delineate the left ventricular endocardial borders. 5. Global right ventricle has normal systolic function.The right ventricular size is normal. No increase in right ventricular wall thickness. 6. Left atrial size was normal. 7. Right atrial size was normal. 8. The mitral valve is normal in structure. Trivial mitral valve regurgitation. No evidence of mitral stenosis. 9. The tricuspid valve is normal in structure. Tricuspid valve regurgitation is mild. 10. The aortic valve is tricuspid. Aortic valve regurgitation is not visualized. No evidence of aortic valve sclerosis or stenosis. 11. The pulmonic valve was normal in structure. Pulmonic valve regurgitation is not visualized. 12. Normal pulmonary artery systolic pressure. 13. The inferior vena cava is normal in size with greater than 50% respiratory variability, suggesting right atrial pressure of 3 mmHg. 14. Left ventricular diastolic parameters are consistent with Grade I diastolic dysfunction (impaired relaxation).   Patient Profile     85 y.o. male with hyperlipidemia, hypertension, diabetes, dementia who presented with who presented with right-sided gaze preference and left-sided hemiplegia. Subsequently underwent thrombectomy of left carotid/internal carotid. Cardiac high-sensitivity troponins were elevated on admission and EKG demonstrated anterior ST elevation. ST elevation has persisted, enzymes are decreasing, thrombus in LV apex, felt to be the source of the patient's stroke on an embolic basis.  Assessment & Plan    1.  Probable anterolateral MI: prior to admission resulting in marked wall motion abnormality with apical thrombus and EF less than 20%. HsT peaked at [address].  ECG with ST elevation anterolaterally.  Repeat ECG showed Q waves V1 through V3 with persistent ST elevation  anterolaterally. Current plan is for medical management.  -- ASA was reduced to 81mg  yesterday after discussion with neurology.   2.  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  Residual left hemiparesis.  Warfarin started for anticoagulation, INR 2.8 today. With concern for hemorraghic conversion, and age will plan to DC heparin today.  -- PharmD following  3.  Ischemic cardiomyopathy with EF estimate on echo less than 20%: tolerating low dose of coreg, but no room to further titrate with soft blood pressures.  -- no signs of volume overload.  4.  AKI: Creatinine 2.32 >>> 1.8 > 1.78>>1.57.  5.  Atrial fibrillation: Converted to sinus rhythm on IV amiodarone which was started 01/30/2019.  Maintaining sinus rhythm. Now converted to amiodarone 200mg  BID.   6.  LFT elevation: May be secondary to MI with transient hepatic congestion.  Must watch closely with amiodarone.  Since LFTs  are elevated , but significantly improved  AST/ALT 33/49  7.  Hyperlipidemia: Target LDL less than 70.  Initiate statin therapy when LFTs normalize.   Planned to work with PT and begin mobilizing today.  For questions or updates, please contact [facility] Please consult [email or web address] for contact info under        Signed, Laverda PageLindsay Roberts, NP  02/02/2019, 9:33 AM    Agree with note by Laverda PageLindsay Roberts NP-C  .  Patient mid on 17th of the stroke.  He underwent acute stroke intervention.  His EF is in the 20% range.  His high-sensitivity troponins were positive in the 13,000 range.  His echo did reveal an apical mural thrombus.  His EKG shows anterior MI probably delayed presentation out of hospital.  He denies chest pain or shortness of breath.  He did have A. fib transiently converted to sinus rhythm on IV amiodarone now on p.o. amiodarone.  He is relatively hypotensive on carvedilol 3.125 mg p.o. twice daily with blood pressure  in the 80-90 range systolic.  His serum creatinine is slowly improving.  He was on heparin which which was discontinued and has been on Coumadin and INR of 2.8.  His mural thrombus, optimal INR is in the 3 range.  He is followed by the neurology service.  It is unclear whether her stroke was related to his apical mural thrombus or from his PAF.  I do not think there are any plans to pursue cardiac catheterization at this time.  Recommend conservative medical therapy for LV dysfunction as well as anticoagulation.  The neurology service.  Runell GessJonathan J. Bryton Waight, M.D., FACP, [facility] Medical CenterFACC, Earl LagosFAHA, [facility] HospitalFSCAI [facility] [address] [place], [place]  [id number]  [phone] 02/02/2019 10:18 AM

## 2019-02-02 NOTE — Progress Notes (Addendum)
  Speech Language Pathology Treatment: Dysphagia  Patient Details Name: Adam Leon MRN: [id number] DOB: 1930-12-08 Today's Date: 02/02/2019 Time: 8295-6213 SLP Time Calculation (min) (ACUTE ONLY): 19 min  Assessment / Plan / Recommendation Clinical Impression  Wet voice, throat clearing with expectoration of viscous secretions *frothy* concerning for UES and esophageal dyfunction, Cues to throat clear and expectorate helpful to expel frothy secretions and clear wet voice.  Pt does admit to h/o dysphagia but reports only occuring x2 weeks, SLP then provided pt with thin water - after which he conducts mulitple swallows with audible swallow and throat clearing- again pt expectorated frothy secretions.    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  Pt will likely benefit from UGI/esophagram during hospital coarse (he was scheduled for OP UGI study per Adam Leon).  Advised RN to keep pt NPO pending his MBS.   Pt, RN, xray and SLP conducting MBS informed and agreeable to plan.  Pt on schedule for approx noon today.  SlP messaged Adam Adam Leon with findings/plan.  HPI HPI: Adam Leon is a 85 y.o. male who has a PMH including but not limited to syncope, HLD.  He presented to [facility] ED 12/16 with left hemiplegia.  CTA showed R ICA occlusion.  He was subsequently taken to IR for arteriogram with complete revascularization of occluded extracranial and intracranial R ICA. CXR 12/17: interstitial edema.  CT 12/16: R MCA infarct in deep white matter      SLP Plan  MBS       Recommendations  Diet recommendations: NPO(npo pending MBS) Liquids provided via: Cup;Straw Postural Changes and/or Swallow Maneuvers: Seated upright 90  degrees;Upright 30-60 min after meal                Oral Care Recommendations: Oral care BID SLP Visit Diagnosis: Dysphagia, pharyngoesophageal phase (R13.14) Plan: MBS       GO                Adam Leon 02/02/2019, 11:30 AM  Adam Lime, MS [place] Office [phone]

## 2019-02-02 NOTE — Progress Notes (Addendum)
STROKE TEAM PROGRESS NOTE   INTERVAL HISTORY Pt seen with RN at bedsdie. He is pleasant and has no complaints. Now off Amioderone gtt. Transfer orders to neuro floor placed. BP still on the low end. Cardiology started him on low dose carvedilol. Pt right sided weakness much improved. On heparin gtt per pharmacy for bridge w/warfarin. INR is now therepeautic.  Vitals:   02/02/19 0900 02/02/19 1000 02/02/19 1100 02/02/19 1130  BP: (!) 90/56 (!) 85/51 (!) 89/68 (!) 90/57  Pulse: 63 62 61 62  Resp: 17 18 15 19   Temp:      TempSrc:      SpO2: 97% 97% 98% 98%  Weight:      Height:        CBC:  Recent Labs  Lab 01/29/19 0240 01/29/19 1355 02/01/19 0547 02/02/19 0502  WBC 17.4* 18.6* 12.1* 11.8*  NEUTROABS 14.5* 14.8*  --   --   HGB 11.8* 11.3* 10.5* 10.7*  HCT 35.8* 33.2* 31.8* 33.5*  MCV 83.1 81.4 83.7 86.6  PLT 406* 428* 424* 444*    Basic Metabolic Panel:  Recent Labs  Lab 01/30/19 1125 02/01/19 0547 02/02/19 0502  NA  --  136 137  K  --  3.5 3.6  CL  --  106 104  CO2  --  17* 20*  GLUCOSE  --  131* 142*  BUN  --  44* 36*  CREATININE  --  1.78* 1.57*  CALCIUM  --  7.9* 7.7*  MG 2.0  --   --    Lipid Panel:     Component Value Date/Time   CHOL 123 01/29/2019 0222   TRIG 244 (H) 01/29/2019 0222   HDL <10 (L) 01/29/2019 0222   CHOLHDL NOT CALCULATED 01/29/2019 0222   VLDL 49 (H) 01/29/2019 0222   LDLCALC NOT CALCULATED 01/29/2019 0222   HgbA1c:  Lab Results  Component Value Date   HGBA1C 8.8 (H) 01/29/2019   Urine Drug Screen: No results found for: LABOPIA, COCAINSCRNUR, LABBENZ, AMPHETMU, THCU, LABBARB  Alcohol Level     Component Value Date/Time   ETH <10 01/28/2019 2120    IMAGING  No results found. Cerebral Angiogram S/P bilateral common carotid and RT Vert arteriogram followed by complete revascularization of occluded extracranial and intracranial RT ICA with x 2 passes with 27mmx 40 mm solitaireX retriever device and penumbra aspiration  achieving a TICI 3  Rt MCA revascularization.   PHYSICAL EXAM  Temp:  [97.3 F (36.3 C)-98.6 F (37 C)] 97.3 F (36.3 C) (12/21 0800) Pulse Rate:  [45-66] 62 (12/21 1130) Resp:  [15-27] 19 (12/21 1130) BP: (85-109)/(51-68) 90/57 (12/21 1130) SpO2:  [95 %-98 %] 98 % (12/21 1130) Arterial Line BP: (106-114)/(45-50) 109/46 (12/20 1600)  General - frail elderly Caucasian male in no apparent distress.  Ophthalmologic - fundi not visualized due to noncooperation.  Cardiovascular - Regular rhythm and rate, not in afib.  Neuro - awake alert, fluent language, however, intermittently confused and incoherent. He is oriented to self, place and month and people, but not  to year and age. Able to name without difficulty, able to repeat simple sentences. Able to follow simple commands. No gaze deviation, no hemianopia. However, more right side preference and mild left sided neglect. Tracking on both sides, PERRL. Mild left facial droop. Tongue midline. Left UE 4/5. LLE proximal 4+/5, distal 5/5 with toe DF and PF. Sensation symmtrical subjectively. FTN intact on the right but dysmetria on the left. Gait not tested.  ASSESSMENT/PLAN [name] is a 85 y.o. male with history of HLD, HTN, DM presenting after a fall with L sided weakness face, arm and leg.   Stroke: bilateral anterior and posterior infarcts with L ICA occlusion s/p IR w TICI3 revascularization, likely embolic d/t new AF w/ RVR and LV thrombus in the setting of undiagnosed recent MI  Code Stroke CT head No acute abnormality. pontiue hypodensity. ASPECTS 10.     CTA head & neck R ICA occlusion. Decreased R MCA flow w/ good cross-filling.  CT perfusion 13 mL core infarct R watershed w/ delayed perfusion R MCA d/t R ICA occlusion   Cerebral Angio occlusion intra and extracranial ICA with TICI3 reperfusion    MRI  Scattered small cerebrum and cerebellar bilateral infarcts (greatest R frontoparietal)  MRA  Limited  unremarkable  Carotid Doppler  R 1-39% stenosis    2D Echo severe LV dysfunction < 20% w/ possible stress cardiomyopathy w/ apical ballooning and LV thrombus  LDL NTC due to TG 244 and low HDL < 10  Direct LDL - 58.5  HgbA1c 8.8  IV heparin for VTE prophylaxis  aspirin 81 mg daily prior to admission, now on aspirin 325 mg daily and heparin IV. warfarin started 01/31/19  Therapy recommendations:  CIR  Disposition:  pending   PAF w/ RVR, new diagnosis  Home anticoagulation:  none   CHA2DS2-VASc Score = at least 6, ?2 oral anticoagulation recommended  Age in Years:  ?589   +2    Sex:  Male   0    Hypertension History:  yes   +1     Diabetes Mellitus:  yes   +1  Congestive Heart Failure History:  0  Vascular Disease History:  yes   +1     Stroke/TIA/Thromboembolism History:  yes   +2 . Placed on IV heparin / coumadin given LV clot and atrial fibrillation . Started on amiodarone w/ bolus due to RVR -> Per Dr Tresa EndoKelly. Now PO Amio on 02/01/19 . Converted to sinus rhythm   Abnormal EKG w/ ST elevation LV Systolic Dysunction, LV thrombus  Felt likely reflective of ICA occlusion w/ demand ischemia  EKG w/ concern for anterior MI  Trop 15519->15715->11893   2D Echo severe LV dysfunction < 20% w/ possible stress cardiomyopathy w/ apical ballooning and LV thrombus  No BB d/t low BP  BNP 4321.1->2,302  Cardiology Dr. Katrinka BlazingSmith on board   Probable anterolateral MI prior to admission per cardiology  Hypotension Hx Hypertension  Home meds:  atenolol 25 daily, lisinopril 5  Low BP felt to be related to AF in setting of severe LV dysfunction  BP goal 120-140 by artery line  PRN levophed for BP goal  Stopped IVF d/t severe LV dysfunction . Long-term BP goal normotensive  Hyperlipidemia  Home meds:  pravachol 40  LDL NTC (TG 244 and HDL < 10), goal < 70  Direct LDL - 58.5  Statin held d/t elevated LFTs - improving - (see below)  Continue statin once LFTs  improved  Diabetes type II Uncontrolled  Home meds:  Metformin 500 bid  HgbA1c 8.8, goal < 7.0  CBGs  SSI  Close PCP follow up  UGIB, resolved - ? Stress ulcer  Coffee ground color emesis - resolved   Off NG suction  PPI IV Q12h  On diet now  Other Stroke Risk Factors  Advanced age  Other Active Problems  Mild cognitive decline - agitation early AM 12/19. Improved today  Physical deconditioning over past 2 weeks going from independent walking to using a walker. Had ER eval 2 wks ago for fatigue and decreased appetite.   AKI 2.32->2.2->1.94->1.91->1.70->1.80->1.78  Leukocytosis 15.0-17.4-18.6-15.5->15.1->12.1  Acute blood loss anemia 12.9-13.6-11.8-11.3-10.6->10.6->10.5  Mild transaminitis ; AST 67-> 68->114->44 ALT 49->50 ALP 133->146->105->64 - improving  CCM signed off 01/31/19  Anticoagulation - Heparin / Coumadin per pharmacy for afib and thrombus - INR - 1.4->2.2->2.8   Moderate, fixed thrombus on the apical wall of the left ventricle by echo 01/29/19  Potassium - 3.5->3.6  Hospital day # 5 Plan mobilize up in bed.  Therapy consults. Now off amidodrine drip. Plan will hold transfer to neuro floor today due to low BP. Continuet warfarin and discontinue heparin as overlap x 2 days  D/w pharmacy. Hopefully transfer to rehab in a few days and he may need elective cardiac catheterization and revascularization procedure if necessary after he improves.    Desiree Metzger-Cihelka, ARNP-C, ANVP-BC Pager: [phone] I have personally obtained history,examined this patient, reviewed notes, independently viewed imaging studies, participated in medical decision making and plan of care.ROS completed by me personally and pertinent positives fully documented  I have made any additions or clarifications directly to the above note. Agree with note above. This patient is critically ill and at significant risk of neurological worsening, death and care requires constant  monitoring of vital signs, hemodynamics,respiratory and cardiac monitoring, extensive review of multiple databases, frequent neurological assessment, discussion with family, other specialists and medical decision making of high complexity.I have made any additions or clarifications directly to the above note.This critical care time does not reflect procedure time, or teaching time or supervisory time of PA/NP/Med Resident etc but could involve care discussion time.  I spent 30 minutes of neurocritical care time  in the care of  this patient.     Delia Heady, MD Medical Director [facility] Pager: [phone] 02/02/2019 4:10 PM  To contact Stroke Continuity provider, please refer to [email or web address]. After hours, contact General Neurology

## 2019-02-02 NOTE — Plan of Care (Signed)
  Problem: Activity: Goal: Risk for activity intolerance will decrease Outcome: Progressing   Problem: Safety: Goal: Ability to remain free from injury will improve Outcome: Progressing   Problem: Skin Integrity: Goal: Risk for impaired skin integrity will decrease Outcome: Progressing   

## 2019-02-02 NOTE — Progress Notes (Signed)
Nutrition Follow-up  DOCUMENTATION CODES:   Not applicable  INTERVENTION:   Ensure Enlive po BID, each supplement provides 350 kcal and 20 grams of protein   NUTRITION DIAGNOSIS:   Inadequate oral intake related to dysphagia as evidenced by meal completion < 50%. Ongoing   GOAL:   Patient will meet greater than or equal to 90% of their needs Progressing  MONITOR:   Diet advancement, PO intake, Supplement acceptance  REASON FOR ASSESSMENT:   Malnutrition Screening Tool    ASSESSMENT:   Pt with PMH of HLD admitted with R MCA infarct secondary to R ICA occlusion s/p IR for revascularization.   Pt discussed during ICU rounds and with RN.  12/21 MBS, dysphagia 2 with thin liquids resumed  Medications reviewed Labs reviewed    Diet Order:   Diet Order            DIET DYS 2 Room service appropriate? Yes with Assist; Fluid consistency: Thin  Diet effective now              EDUCATION NEEDS:   No education needs have been identified at this time  Skin:  Skin Assessment: Reviewed RN Assessment  Last BM:  unknown  Height:   Ht Readings from Last 1 Encounters:  01/28/19 6' (1.829 m)    Weight:   Wt Readings from Last 1 Encounters:  01/28/19 88.5 kg    Ideal Body Weight:     BMI:  Body mass index is 26.45 kg/m.  Estimated Nutritional Needs:   Kcal:  2000-2200  Protein:  100-115 grams  Fluid:  >2 L/day  Maylon Peppers RD, LDN, CNSC [phone] Pager [phone] After Hours Pager

## 2019-02-02 NOTE — Progress Notes (Signed)
Physical Therapy Treatment Patient Details Name: Adam Leon MRN: [id number] DOB: May 17, 1930 Today's Date: 02/02/2019    History of Present Illness [name] is a 85 y.o. male with history of HLD, HTN, DM presenting following a fall with L sided weakness face, arm and leg. R MCA infarct due to L ICA occlusion s/p IR w TICI3 revascularization, likely embolic d/t new AF w/ RVR and LV thrombus in the setting of undiagnosed recent MI    PT Comments    Mentation has improved from the evaluation.  Pt needs extra time to follow instruction and is not consistent.  Emphasis on initiation, transitions to EOB, sitting balance, sit to stand and progressing gait stability and stamina.    Follow Up Recommendations  CIR     Equipment Recommendations  None recommended by PT    Recommendations for Other Services       Precautions / Restrictions Precautions Precautions: Fall Precaution Comments: had NG tube    Mobility  Bed Mobility Overal bed mobility: Needs Assistance Bed Mobility: Supine to Sit     Supine to sit: Mod assist     General bed mobility comments: pt initiated to tactile cuing with his LE's and pt assisted to come up and forward.  Transfers Overall transfer level: Needs assistance Equipment used: 1 person hand held assist;2 person hand held assist Transfers: Sit to/from Stand Sit to Stand: Min assist;+2 safety/equipment         General transfer comment: assist forward and boost.  stability assist once up.  Ambulation/Gait Ambulation/Gait assistance: Min assist;+2 safety/equipment Gait Distance (Feet): 75 Feet Assistive device: 2 person hand held assist Gait Pattern/deviations: Decreased stance time - left;Decreased step length - right;Decreased step length - left;Step-through pattern Gait velocity: decreased   General Gait Details: pt mildly unsteady overall, with need for w/shift and support for L side weakness   Stairs              Wheelchair Mobility    Modified Rankin (Stroke Patients Only) Modified Rankin (Stroke Patients Only) Pre-Morbid Rankin Score: No symptoms Modified Rankin: Moderately severe disability     Balance Overall balance assessment: Needs assistance   Sitting balance-Leahy Scale: Fair     Standing balance support: During functional activity;Bilateral upper extremity supported Standing balance-Leahy Scale: Poor Standing balance comment: bil external support                            Cognition Arousal/Alertness: Awake/alert Behavior During Therapy: Flat affect Overall Cognitive Status: Impaired/Different from baseline                   Orientation Level: Disoriented to;Place;Time;Situation Current Attention Level: Sustained Memory: Decreased short-term memory Following Commands: Follows one step commands inconsistently Safety/Judgement: Decreased awareness of safety;Decreased awareness of deficits Awareness: Intellectual Problem Solving: Slow processing;Decreased initiation;Difficulty sequencing;Requires verbal cues;Requires tactile cues General Comments: Pt pleasantly confused throughout      Exercises Other Exercises Other Exercises: bil LE warm up hip/knee flex/ext with resisted gross ext x10 reps    General Comments General comments (skin integrity, edema, etc.): vss      Pertinent Vitals/Pain Pain Assessment: Faces Faces Pain Scale: No hurt    Home Living                      Prior Function            PT Goals (current goals can now be found  in the care plan section) Acute Rehab PT Goals PT Goal Formulation: With patient Time For Goal Achievement: 02/12/19 Potential to Achieve Goals: Good Progress towards PT goals: Progressing toward goals    Frequency    Min 3X/week      PT Plan Current plan remains appropriate    Co-evaluation              AM-PAC PT "6 Clicks" Mobility   Outcome Measure  Help needed  turning from your back to your side while in a flat bed without using bedrails?: A Lot Help needed moving from lying on your back to sitting on the side of a flat bed without using bedrails?: A Lot Help needed moving to and from a bed to a chair (including a wheelchair)?: A Little Help needed standing up from a chair using your arms (e.g., wheelchair or bedside chair)?: A Little Help needed to walk in hospital room?: A Little Help needed climbing 3-5 steps with a railing? : A Lot 6 Click Score: 15    End of Session   Activity Tolerance: Patient tolerated treatment well;Patient limited by fatigue Patient left: in chair;with call bell/phone within reach;with chair alarm set Nurse Communication: Mobility status PT Visit Diagnosis: Unsteadiness on feet (R26.81);Other abnormalities of gait and mobility (R26.89);Other symptoms and signs involving the nervous system (R29.898)     Time: 2703-5009 PT Time Calculation (min) (ACUTE ONLY): 21 min  Charges:  $Gait Training: 8-22 mins                     02/02/2019  Ginger Carne., PT Acute Rehabilitation Services [phone]  (pager) [phone]  (office)   Tessie Fass Cindie Rajagopalan 02/02/2019, 4:44 PM

## 2019-02-02 NOTE — Progress Notes (Signed)
[place] for Heparin/Coumadin Indication: ACS s/p carotid embolectomy and LV thrombus   Patient Measurements: Height: 6' (182.9 cm) Weight: 195 lb (88.5 kg) IBW/kg (Calculated) : 77.6 Heparin weight: 88.5kg  Vital Signs: Temp: 97.3 F (36.3 C) (12/21 0800) Temp Source: Oral (12/21 0800) BP: 95/61 (12/21 0800) Pulse Rate: 66 (12/21 0800)  Labs: Recent Labs    01/31/19 0637 02/01/19 0547 02/02/19 0502 02/02/19 0724  HGB 10.6* 10.5* 10.7*  --   HCT 32.1* 31.8* 33.5*  --   PLT 409* 424* 444*  --   LABPROT 17.2* 24.0* 29.8*  --   INR 1.4* 2.2* 2.8*  --   HEPARINUNFRC 0.37 0.20* 0.17* 0.12*  CREATININE 1.80* 1.78* 1.57*  --     Estimated Creatinine Clearance: 37.1 mL/min (A) (by C-G formula based on SCr of 1.57 mg/dL (H)).    Assessment: 85 y.o. male with EKG changes, elevated cardiac markers and LV thrombus s/p CVA and carotid thrombectomy on IV heparin and warfarin.   Heparin level low and down trending despite up trending rate. Confirmed no issues with heparin infusion with RN, running in L PIV. Second HL today was drawn by phlebotomy, still low today (confirmed first level). On aspirin, heparin and warfarin, H/H & plt stable. Has risk for hemorrhagic conversion of CVA. INR 2.8 but may not be fully anticoagulated given factor VII has short half life and drives INR up but factor II half life is ~72hr and is major contributor to clotting. No overt bleeding, some bruising around PIV. Discussed with Neuro and Cards, decided to stop heparin and give small dose of warfarin.  Goal of Therapy:  Heparin level 0.3-0.5 units/mL Monitor platelets by anticoagulation protocol: Yes   Plan:  -Stop heparin drip  -Give warfarin 0.5mg  tonight -Daily INR and CBC -Monitor for bleeding  Benetta Spar, PharmD, BCPS, BCCP Clinical Pharmacist  Please check AMION for all [place] phone numbers After 10:00 PM, call [place]

## 2019-02-03 LAB — GLUCOSE, CAPILLARY
Glucose-Capillary: 111 mg/dL — ABNORMAL HIGH (ref 70–99)
Glucose-Capillary: 123 mg/dL — ABNORMAL HIGH (ref 70–99)
Glucose-Capillary: 191 mg/dL — ABNORMAL HIGH (ref 70–99)
Glucose-Capillary: 193 mg/dL — ABNORMAL HIGH (ref 70–99)
Glucose-Capillary: 258 mg/dL — ABNORMAL HIGH (ref 70–99)
Glucose-Capillary: 88 mg/dL (ref 70–99)

## 2019-02-03 LAB — BASIC METABOLIC PANEL
Anion gap: 13 (ref 5–15)
BUN: 32 mg/dL — ABNORMAL HIGH (ref 8–23)
CO2: 20 mmol/L — ABNORMAL LOW (ref 22–32)
Calcium: 7.7 mg/dL — ABNORMAL LOW (ref 8.9–10.3)
Chloride: 105 mmol/L (ref 98–111)
Creatinine, Ser: 1.48 mg/dL — ABNORMAL HIGH (ref 0.61–1.24)
GFR calc Af Amer: 49 mL/min — ABNORMAL LOW (ref >60)
GFR calc non Af Amer: 42 mL/min — ABNORMAL LOW (ref >60)
Glucose, Bld: 83 mg/dL (ref 70–99)
Potassium: 3.2 mmol/L — ABNORMAL LOW (ref 3.5–5.1)
Sodium: 138 mmol/L (ref 135–145)

## 2019-02-03 LAB — CBC
HCT: 32.9 % — ABNORMAL LOW (ref 39.0–52.0)
Hemoglobin: 10.5 g/dL — ABNORMAL LOW (ref 13.0–17.0)
MCH: 27.9 pg (ref 26.0–34.0)
MCHC: 31.9 g/dL (ref 30.0–36.0)
MCV: 87.3 fL (ref 80.0–100.0)
Platelets: 410 10*3/uL — ABNORMAL HIGH (ref 150–400)
RBC: 3.77 MIL/uL — ABNORMAL LOW (ref 4.22–5.81)
RDW: 14.2 % (ref 11.5–15.5)
WBC: 10.4 10*3/uL (ref 4.0–10.5)
nRBC: 0 % (ref 0.0–0.2)

## 2019-02-03 LAB — PROTIME-INR
INR: 1.9 — ABNORMAL HIGH (ref 0.8–1.2)
Prothrombin Time: 22 seconds — ABNORMAL HIGH (ref 11.4–15.2)

## 2019-02-03 MED ORDER — IPRATROPIUM-ALBUTEROL 0.5-2.5 (3) MG/3ML IN SOLN
RESPIRATORY_TRACT | Status: AC
  Administered 2019-02-04: 3 mL
  Filled 2019-02-03: qty 3

## 2019-02-03 MED ORDER — POTASSIUM CHLORIDE CRYS ER 20 MEQ PO TBCR
40.0000 meq | EXTENDED_RELEASE_TABLET | Freq: Two times a day (BID) | ORAL | Status: AC
  Administered 2019-02-03 (×2): 40 meq via ORAL
  Filled 2019-02-03 (×2): qty 2

## 2019-02-03 MED ORDER — ATORVASTATIN CALCIUM 40 MG PO TABS
40.0000 mg | ORAL_TABLET | Freq: Every day | ORAL | Status: DC
  Administered 2019-02-03 – 2019-02-05 (×3): 40 mg via ORAL
  Filled 2019-02-03 (×3): qty 1

## 2019-02-03 MED ORDER — WARFARIN SODIUM 2 MG PO TABS
2.0000 mg | ORAL_TABLET | Freq: Once | ORAL | Status: AC
  Administered 2019-02-03: 2 mg via ORAL
  Filled 2019-02-03 (×2): qty 1

## 2019-02-03 NOTE — Progress Notes (Signed)
SLP Cancellation Note  Patient Details Name: Adam Leon MRN: [id number] DOB: [birth year]   Cancelled treatment:       Reason Eval/Treat Not Completed: Patient  Unavailable - MD in room. Per RN, pt tolerates Dys 2/thin liquid diet with strict adherence to safe swallow precautions of small bites at slow rate. Continue to suspect esophageal issues given difficulty if bolus size and rate are increased. SLP will continue to follow to assess diet tolerance and provide education.  Carmela Rima, [place] Speech Language Pathologist Office: [phone] Pager: [phone]  Shonna Chock 02/03/2019, 2:54 PM

## 2019-02-03 NOTE — Progress Notes (Signed)
Inpatient Rehabilitation Admissions Coordinator  Insurance has given an initial denial for CIR admit. I contacted Dr. Leonie Man and I have arranged a peer to peer appeal with Dr. Jerene Pitch at [facility]. I contacted pt's daughter by phone and she is aware.  Danne Baxter, RN, MSN Rehab Admissions Coordinator [phone] 02/03/2019 4:23 PM

## 2019-02-03 NOTE — Progress Notes (Addendum)
[place] for Heparin/Coumadin Indication: ACS s/p carotid embolectomy and LV thrombus   Patient Measurements: Height: 6' (182.9 cm) Weight: 195 lb (88.5 kg) IBW/kg (Calculated) : 77.6 Heparin weight: 88.5kg  Vital Signs: Temp: 98.8 F (37.1 C) (12/22 0400) Temp Source: Axillary (12/22 0400) BP: 103/55 (12/22 0828) Pulse Rate: 73 (12/22 0828)  Labs: Recent Labs    02/01/19 0547 02/02/19 0502 02/02/19 0724 02/03/19 0621  HGB 10.5* 10.7*  --  10.5*  HCT 31.8* 33.5*  --  32.9*  PLT 424* 444*  --  410*  LABPROT 24.0* 29.8*  --  22.0*  INR 2.2* 2.8*  --  1.9*  HEPARINUNFRC 0.20* 0.17* 0.12*  --   CREATININE 1.78* 1.57*  --  1.48*    Estimated Creatinine Clearance: 39.3 mL/min (A) (by C-G formula based on SCr of 1.48 mg/dL (H)).    Assessment: 85 y.o. male with EKG changes, elevated cardiac markers and LV thrombus s/p CVA and carotid thrombectomy started on warfarin.   INR down to 1.9. Off heparin drip. If patient does not have bleeding, try to keep INR closer to 3 per cardiology for LV mural thrombus. Of note, had one episode of afib on 12/18 but as been NSR since. H/H & plt stable. Note, amiodarone was started 12/18 (IV load), now on PO.   Goal of Therapy:  Heparin level 0.3-0.5 units/mL Monitor platelets by anticoagulation protocol: Yes   Plan:   -Give warfarin 2mg  tonight -Daily INR and CBC -Monitor for bleeding  Benetta Spar, PharmD, BCPS, BCCP Clinical Pharmacist  Please check AMION for all [place] phone numbers After 10:00 PM, call [place]

## 2019-02-03 NOTE — Progress Notes (Signed)
Pt transferred from 4N ICU to 3W39. Pt is alert and oriented to self and place. Pt has some redness on sacral area that blanches. Sacral dressing is in place and foam dressing between shoulder blades as a prophylactic to prevent skin breakdown. Pt placed on telemetry box 14 with Sinus rhythm noted on the monitor. Pt oriented to the unit. Bed is placed in lowest position and call button is within reach. Pt daughter is at bedside and pleasant/cooperative.

## 2019-02-03 NOTE — Progress Notes (Addendum)
STROKE TEAM PROGRESS NOTE   INTERVAL HISTORY Pt seen with RN at bedsdie.Transfer orders to neuro floor placed. BP still on the low end, but improved. Pt right sided weakness much improved. INR had been therapeutic while bridging with IV Heparin. Now off heparin gtt, but INR down to 1.9 today.  Blood pressure doing slightly better and is in the low to mid 90s.  Vitals:   02/03/19 1000 02/03/19 1030 02/03/19 1100 02/03/19 1130  BP: (!) 93/53 (!) 91/59 (!) 91/58 (!) 91/55  Pulse: 67 67 65 65  Resp: (!) Temp:      TempSrc:      SpO2: 96% 96% 95% 97%  Weight:      Height:        CBC:  Recent Labs  Lab 01/29/19 0240 01/29/19 1355 02/02/19 0502 02/03/19 0621  WBC 17.4* 18.6* 11.8* 10.4  NEUTROABS 14.5* 14.8*  --   --   HGB 11.8* 11.3* 10.7* 10.5*  HCT 35.8* 33.2* 33.5* 32.9*  MCV 83.1 81.4 86.6 87.3  PLT 406* 428* 444* 410*    Basic Metabolic Panel:  Recent Labs  Lab 01/30/19 1125 02/02/19 0502 02/03/19 0621  NA  --  137 138  K  --  3.6 3.2*  CL  --  104 105  CO2  --  20* 20*  GLUCOSE  --  142* 83  BUN  --  36* 32*  CREATININE  --  1.57* 1.48*  CALCIUM  --  7.7* 7.7*  MG 2.0  --   --    Lipid Panel:     Component Value Date/Time   CHOL 123 01/29/2019 0222   TRIG 244 (H) 01/29/2019 0222   HDL <10 (L) 01/29/2019 0222   CHOLHDL NOT CALCULATED 01/29/2019 0222   VLDL 49 (H) 01/29/2019 0222   LDLCALC NOT CALCULATED 01/29/2019 0222   HgbA1c:  Lab Results  Component Value Date   HGBA1C 8.8 (H) 01/29/2019   Urine Drug Screen: No results found for: LABOPIA, COCAINSCRNUR, LABBENZ, AMPHETMU, THCU, LABBARB  Alcohol Level     Component Value Date/Time   [facility] <10 01/28/2019 2120    IMAGING  DG Swallowing Func-Speech Pathology  Result Date: 02/02/2019 Objective Swallowing Evaluation: Type of Study: MBS-Modified Barium Swallow Study  Patient Details Name: Adam Leon MRN: [id number] Date of Birth: [birth year] Today's Date: 02/02/2019 Time: SLP Start  Time (ACUTE ONLY): 1220 -SLP Stop Time (ACUTE ONLY): 1245 SLP Time Calculation (min) (ACUTE ONLY): 25 min Past Medical History: Past Medical History: Diagnosis Date . Hypercholesteremia  . Syncope  . Syncope  . Ventricular hypertrophy  Past Surgical History: Past Surgical History: Procedure Laterality Date . IR ANGIO INTRA EXTRACRAN SEL COM CAROTID INNOMINATE UNI L MOD SED  01/29/2019 . IR ANGIO VERTEBRAL SEL SUBCLAVIAN INNOMINATE UNI R MOD SED  01/29/2019 . IR CT HEAD LTD  01/29/2019 . IR PERCUTANEOUS ART THROMBECTOMY/INFUSION INTRACRANIAL INC DIAG ANGIO  01/29/2019 HPI: Adam Leon is a 85 y.o. male who has a PMH including but not limited to syncope, HLD.  [facility] ED [facility]/16 with left hemiplegia.  CTA showed R ICA occlusion.  He was subsequently taken to IR for arteriogram with complete revascularization of occluded extracranial and intracranial R ICA. CXR 12/17: interstitial edema.  CT 12/16: R MCA infarct in deep white matter  Subjective: Pt seen in radiology for MBS. Assessment / Plan / Recommendation CHL IP CLINICAL IMPRESSIONS 02/02/2019 Clinical Impression Pt seen in radiology for MBS to  objectively assess swallow function and safety, based on clinical presentation at bedside. Pt was uprght in chair, awake and alert. He is hard of hearing. Pt was given trials of nectar thick liquid, thin liquid, puree, and solid textures. He was also given a barium tablet in water.  Orally, pt exhibited oral holding and piecemeal swallow, with premature spillage to the vallecular sinus. Pharyngeal swallow is characterized by trigger of swallow reflex at the vallecular sinus across consistencies. Very trace subepiglottic penetration was noted during the swallow of thin and nectar thick liquids, however, penetrate cleared completely and spontaeously and was not aspirated. Mild lateral channel and vallecular residue was noted after the swallow on most consistencies, which cleared with second/dry swallow. Barium  tablet cleared the oral cavity without delay, but was noted to pause in the vallecular sinus before clearing with second swallow. Esophageal sweep revealed it to be clear. Recommend continuing with Dys 2 solids and thin liquids, meds either whole or crushed in puree (based on pt mentation, which is likely to vary due to dx dementia). Recommend assistance with feeding as needed, again based on pt mentation. Safe swallow precautions were reviewed with RN, and sent in written form with transport to be placed at [facility]. SLP will follow up to assess diet tolerance and continue education.  SLP Visit Diagnosis Dysphagia, oropharyngeal phase (R13.12)     Impact on safety and function Mild aspiration risk;Moderate aspiration risk   CHL IP TREATMENT RECOMMENDATION 02/02/2019 Treatment Recommendations Therapy as outlined in treatment plan below   Prognosis 02/02/2019 Prognosis for Safe Diet Advancement Fair Barriers to Reach Goals Cognitive deficits   CHL IP DIET RECOMMENDATION 02/02/2019 SLP Diet Recommendations Dysphagia 2 (Fine chop) solids;Thin liquid Liquid Administration via Cup;Straw Medication Administration Other (Comment) Compensations Minimize environmental distractions;Slow rate;Small sips/bites;Multiple dry swallows after each bite/sip Postural Changes Remain semi-upright after after feeds/meals (Comment);Seated upright at 90 degrees   CHL IP OTHER RECOMMENDATIONS 02/02/2019   Oral Care Recommendations Oral care QID     CHL IP FOLLOW UP RECOMMENDATIONS 01/30/2019 Follow up Recommendations Inpatient Rehab   CHL IP FREQUENCY AND DURATION 02/02/2019 Speech Therapy Frequency (ACUTE ONLY) min 2x/week Treatment Duration 2 weeks      CHL IP ORAL PHASE 02/02/2019 Oral Phase Impaired   Oral - Nectar Cup Holding of bolus;Delayed oral transit;Premature spillage   Oral - Thin Cup/Straw Piecemeal swallowing;Premature spillage   Oral - Puree Holding of bolus;Piecemeal swallowing;Premature spillage   Oral - Regular Piecemeal  swallowing;Nasal reflux;Premature spillage   Oral - Pill WFL    CHL IP PHARYNGEAL PHASE 02/02/2019 Pharyngeal Phase Impaired   Pharyngeal- Nectar Cup Delayed swallow initiation-vallecula;Reduced airway/laryngeal closure;Penetration/Aspiration during swallow;Pharyngeal residue - valleculae Pharyngeal Material does not enter airway;Material enters airway, remains ABOVE vocal cords then ejected out   Pharyngeal- Thin Cup Delayed swallow initiation-vallecula;Reduced airway/laryngeal closure;Penetration/Apiration after swallow;Pharyngeal residue - valleculae;Pharyngeal residue - pyriform;Lateral channel residue Pharyngeal Material does not enter airway;Material enters airway, remains ABOVE vocal cords then ejected out   Pharyngeal- Puree Delayed swallow initiation-vallecula   Pharyngeal- Regular Delayed swallow initiation-vallecula;Pharyngeal residue - valleculae   Pharyngeal- Pill Delayed swallow initiation-vallecula Pill paused in the vallecular sinus briefly before clearing the pharynx      CHL IP CERVICAL ESOPHAGEAL PHASE 02/02/2019 Cervical Esophageal Phase [facility] Harlow Asa, MSP, CCC-SLP Speech Language Pathologist Office: [phone] Pager: [phone] Leigh Aurora 02/02/2019, 1:52 PM              Cerebral Angiogram S/P bilateral common carotid and RT Vert arteriogram followed  by complete revascularization of occluded extracranial and intracranial RT ICA with x 2 passes with 19mmx 40 mm solitaireX retriever device and penumbra aspiration achieving a TICI 3  Rt MCA revascularization.   PHYSICAL EXAM  Temp:  [97.5 F (36.4 C)-98.8 F (37.1 C)] 98.8 F (37.1 C) (12/22 0400) Pulse Rate:  [29-78] 65 (12/22 1130) Resp:  [13-26] 18 (12/22 1130) BP: (83-117)/(52-69) 91/55 (12/22 1130) SpO2:  [91 %-100 %] 97 % (12/22 1130)  General - frail elderly caucasian male in no apparent distress.  Ophthalmologic - fundi not visualized due to noncooperation.  Cardiovascular - Regular rhythm and rate, not in  afib.  Neuro - awake alert, fluent language, however, intermittently confused per RN. He is oriented to self, place and month and people, but not  to year. Able to follow simple commands. No gaze deviation, no hemianopia. However, more right side preference and no further left sided neglect. Tracking on both sides, PERRL. Mild left facial droop. Tongue midline. Left UE 4/5. LLE proximal 4+/5, distal 5/5 with toe DF and PF. Sensation symmtrical subjectively. FTN intact on the right but dysmetria on the left. Gait not tested.  ASSESSMENT/PLAN Adam Leon is a 85 y.o. male with history of HLD, HTN, DM presenting after a fall with L sided weakness face, arm and leg.   Stroke: bilateral anterior and posterior infarcts with L ICA occlusion s/p IR w TICI3 revascularization, likely embolic d/t new AF w/ RVR and LV thrombus in the setting of undiagnosed recent MI  Code Stroke CT head No acute abnormality. pontiue hypodensity. ASPECTS 10.     CTA head & neck R ICA occlusion. Decreased R MCA flow w/ good cross-filling.  CT perfusion 13 mL core infarct R watershed w/ delayed perfusion R MCA d/t R ICA occlusion   Cerebral Angio occlusion intra and extracranial ICA with TICI3 reperfusion    MRI  Scattered small cerebrum and cerebellar bilateral infarcts (greatest R frontoparietal)  MRA  Limited unremarkable  Carotid Doppler  R 1-39% stenosis    2D Echo severe LV dysfunction < 20% w/ possible stress cardiomyopathy w/ apical ballooning and LV thrombus  LDL NTC due to TG 244 and low HDL < 10  Direct LDL - 58.5  HgbA1c 8.8  IV heparin for VTE prophylaxis  aspirin 81 mg daily prior to admission, now on aspirin 325 mg daily and heparin IV. warfarin started 01/31/19  Therapy recommendations:  CIR  Disposition:  pending   PAF w/ RVR, new diagnosis  Home anticoagulation:  none   CHA2DS2-VASc Score = at least 6, ?2 oral anticoagulation recommended  Age in Years:  ?89   +2    Sex:  Male    0    Hypertension History:  yes   +1     Diabetes Mellitus:  yes   +1  Congestive Heart Failure History:  0  Vascular Disease History:  yes   +1     Stroke/TIA/Thromboembolism History:  yes   +2 . Placed on IV heparin while bridging to therapeutic INR on coumadin given LV clot and atrial fibrillation. Appreciate pharmacy assisting. Heparin gtt now off, INR down to 1.9 . Started on amiodarone w/ bolus due to RVR -> Per Dr Claiborne Billings. Now PO Amio on 02/01/19 . Converted to sinus rhythm   Abnormal EKG w/ ST elevation LV Systolic Dysunction, LV thrombus  Felt likely reflective of ICA occlusion w/ demand ischemia  EKG w/ concern for anterior MI  Trop 15519->15715->11893   2D  Echo severe LV dysfunction < 20% w/ possible stress cardiomyopathy w/ apical ballooning and LV thrombus  No BB d/t low BP  BNP 4321.1->2,302  Cardiology Dr. Katrinka BlazingSmith on board   Probable anterolateral MI prior to admission per cardiology  Hypotension Hx Hypertension  Home meds:  atenolol 25 daily, lisinopril 5  Low BP felt to be related to AF in setting of severe LV dysfunction  BP goal 120-140 by artery line  PRN levophed for BP goal  Stopped IVF d/t severe LV dysfunction . Long-term BP goal normotensive  Hyperlipidemia  Home meds:  pravachol 40  LDL NTC (TG 244 and HDL < 10), goal < 70  Direct LDL - 58.5  Statin held d/t elevated LFTs - improving - (see below)  Continue statin once LFTs improved  Diabetes type II Uncontrolled  Home meds:  Metformin 500 bid  HgbA1c 8.8, goal < 7.0  CBGs  SSI  Close PCP follow up  UGIB, resolved - ? Stress ulcer  Coffee ground color emesis - resolved   Off NG suction  PPI IV Q12h  On diet now  Other Stroke Risk Factors  Advanced age  MI, LV thrombus and low EF  Other Active Problems  Mild cognitive decline - agitation early AM 12/19. Improved today  Physical deconditioning over past 2 weeks going from independent walking to using a walker.  Had ER eval 2 wks ago for fatigue and decreased appetite.   AKI 2.32->2.2->1.94->1.91->1.70->1.80->1.78->1.4  Leukocytosis 15.0-17.4-18.6-15.5->15.1->12.1->10.4  Acute blood loss anemia 12.9-13.6-11.8-11.3-10.6->10.6->10.5->10.4  Mild transaminitis ; AST 67-> 68->114->44 ALT 49->50 ALP 133->146->105->64 - improving  CCM signed off 01/31/19  Anticoagulation - Heparin / Coumadin per pharmacy for afib and thrombus - INR - 1.4->2.2->2.8 ->1.9  Moderate, fixed thrombus on the apical wall of the left ventricle by echo 01/29/19  Potassium - 3.5->3.6  Hospital day # 6 Plan to mobilize.Therapy consults. Now off amidodrine & Heparin gtt. Plan to transfer to neuro floor today. BP is still low, but improved from yesterday and stable. Continue warfarin.Hopefully transfer to rehab in a few days and he may need elective cardiac catheterization and revascularization procedure if necessary after he improves.    Desiree Metzger-Cihelka, ARNP-C, ANVP-BC Pager: [phone]  02/03/2019 12:55 PM I have personally obtained history,examined this patient, reviewed notes, independently viewed imaging studies, participated in medical decision making and plan of care.ROS completed by me personally and pertinent positives fully documented  I have made any additions or clarifications directly to the above note. Agree with note above.  Discussed with cardiology and answered questions.  Greater than 50% time during this 35-minute visit was spent on counseling and coordination of care about his embolic stroke and recent MI and cardiac status and discussion with care team  Delia HeadyPramod Chesni Vos, MD Medical Director [facility] Pager: [phone] 02/03/2019 2:34 PM  To contact Stroke Continuity provider, please refer to [email or web address]. After hours, contact General Neurology

## 2019-02-03 NOTE — Progress Notes (Signed)
Physical Therapy Treatment Patient Details Name: Dominyk Law MRN: [id number] DOB: March 25, 1930 Today's Date: 02/03/2019    History of Present Illness [name] is a 85 y.o. male with history of HLD, HTN, DM presenting following a fall with L sided weakness face, arm and leg. R MCA infarct due to L ICA occlusion s/p IR w TICI3 revascularization, likely embolic d/t new AF w/ RVR and LV thrombus in the setting of undiagnosed recent MI    PT Comments    Pt has a difficult time following direction with repetition and non verbal cues.  He can now stay on task.  Mobility in general is improving, with patient mostly mobilizing at a minimal assist with mod assist for gait once tired with muscle fatigue.      Follow Up Recommendations  CIR     Equipment Recommendations  None recommended by PT;Other (comment)(TBA)    Recommendations for Other Services       Precautions / Restrictions Precautions Precautions: Fall    Mobility  Bed Mobility Overal bed mobility: Needs Assistance Bed Mobility: Supine to Sit     Supine to sit: Min guard     General bed mobility comments: slow to roll over and up via R elbow, no rail or assist needed.  Transfers Overall transfer level: Needs assistance Equipment used: Rolling walker (2 wheeled) Transfers: Sit to/from Stand Sit to Stand: Min assist         General transfer comment: assist forward and up.  Ambulation/Gait Ambulation/Gait assistance: Min assist;Mod assist Gait Distance (Feet): 110 Feet Assistive device: Rolling walker (2 wheeled) Gait Pattern/deviations: Step-through pattern Gait velocity: decreased Gait velocity interpretation: <1.8 ft/sec, indicate of risk for recurrent falls General Gait Details: slow, mildly unsteady, flexed posture, short steps that degrade to a shuffle with fatigue   Stairs             Wheelchair Mobility    Modified Rankin (Stroke Patients Only) Modified Rankin (Stroke Patients  Only) Modified Rankin: Moderately severe disability     Balance Overall balance assessment: Needs assistance Sitting-balance support: No upper extremity supported;Feet supported Sitting balance-Leahy Scale: Fair Sitting balance - Comments: can manage minimal challenge, starting to reach outside BOS   Standing balance support: During functional activity;Bilateral upper extremity supported Standing balance-Leahy Scale: Poor Standing balance comment: bil external support                            Cognition Arousal/Alertness: Awake/alert Behavior During Therapy: WFL for tasks assessed/performed Overall Cognitive Status: Impaired/Different from baseline(NT formally)                   Orientation Level: Situation;Time Current Attention Level: Sustained     Safety/Judgement: Decreased awareness of safety;Decreased awareness of deficits Awareness: Intellectual Problem Solving: Slow processing;Decreased initiation;Difficulty sequencing;Requires verbal cues;Requires tactile cues        Exercises      General Comments General comments (skin integrity, edema, etc.): vss      Pertinent Vitals/Pain Pain Assessment: Faces Faces Pain Scale: No hurt Pain Intervention(s): Monitored during session    Home Living     Available Help at Discharge: Family;Available 24 hours/day                Prior Function            PT Goals (current goals can now be found in the care plan section) Acute Rehab PT Goals Patient Stated Goal: to  go home safely PT Goal Formulation: With patient Time For Goal Achievement: 02/12/19 Potential to Achieve Goals: Good Progress towards PT goals: Progressing toward goals    Frequency    Min 3X/week      PT Plan Current plan remains appropriate    Co-evaluation              AM-PAC PT "6 Clicks" Mobility   Outcome Measure  Help needed turning from your back to your side while in a flat bed without using  bedrails?: A Little Help needed moving from lying on your back to sitting on the side of a flat bed without using bedrails?: A Little Help needed moving to and from a bed to a chair (including a wheelchair)?: A Little Help needed standing up from a chair using your arms (e.g., wheelchair or bedside chair)?: A Little Help needed to walk in hospital room?: A Lot Help needed climbing 3-5 steps with a railing? : A Lot 6 Click Score: 16    End of Session   Activity Tolerance: Patient tolerated treatment well Patient left: in chair;with call bell/phone within reach;with chair alarm set Nurse Communication: Mobility status PT Visit Diagnosis: Unsteadiness on feet (R26.81);Other abnormalities of gait and mobility (R26.89);Other symptoms and signs involving the nervous system (H47.654)     Time: 6503-5465 PT Time Calculation (min) (ACUTE ONLY): 14 min  Charges:  $Gait Training: 8-22 mins                     02/03/2019  Ginger Carne., PT Acute Rehabilitation Services [phone]  (pager) [phone]  (office)   Tessie Fass Ambrie Carte 02/03/2019, 2:25 PM

## 2019-02-03 NOTE — Progress Notes (Addendum)
Progress Note  Patient Name: Adam Leon Date of Encounter: 02/03/2019  Primary Cardiologist: Armanda Magic, MD  Subjective   Sitting up in bed. No complaints.   Inpatient Medications    Scheduled Meds: . amiodarone  200 mg Oral BID  . aspirin  81 mg Oral Daily  . atorvastatin  40 mg Oral q1800  . carvedilol  3.125 mg Oral BID WC  . chlorhexidine  15 mL Mouth Rinse BID  . Chlorhexidine Gluconate Cloth  6 each Topical Daily  . feeding supplement (ENSURE ENLIVE)  237 mL Oral BID BM  . insulin aspart  0-15 Units Subcutaneous Q4H  . mouth rinse  15 mL Mouth Rinse q12n4p  . pantoprazole (PROTONIX) IV  40 mg Intravenous Q12H  . potassium chloride  40 mEq Oral BID WC  . sodium bicarbonate  650 mg Oral TID  . sodium chloride flush  10-40 mL Intracatheter Q12H  . warfarin  2 mg Oral ONCE-1800  . Warfarin - Pharmacist Dosing Inpatient   Does not apply q1800   Continuous Infusions:  PRN Meds: acetaminophen **OR** acetaminophen (TYLENOL) oral liquid 160 mg/5 mL **OR** acetaminophen, iohexol, ondansetron (ZOFRAN) IV, polyethylene glycol, senna-docusate, sodium chloride flush   Vital Signs    Vitals:   02/03/19 0400 02/03/19 0500 02/03/19 0600 02/03/19 0828  BP: (!) 94/57 (!) 92/55 (!) 95/56 (!) 103/55  Pulse: (!) 59 60 (!) 56 73  Resp: 15 20 15    Temp: 98.8 F (37.1 C)     TempSrc: Axillary     SpO2: 97% 97% 99%   Weight:      Height:        Intake/Output Summary (Last 24 hours) at 02/03/2019 0911 Last data filed at 02/03/2019 0600 Gross per 24 hour  Intake 27.39 ml  Output 400 ml  Net -372.61 ml   Last 3 Weights 01/28/2019  Weight (lbs) 195 lb  Weight (kg) 88.451 kg      Telemetry    SR - Personally Reviewed  ECG    No new tracing  Physical Exam  Pleasant older WM GEN: No acute distress.   Neck: No JVD Cardiac: RRR, no murmurs, rubs, or gallops.  Respiratory: Clear to auscultation bilaterally. GI: Soft, nontender, non-distended  MS: No edema; No  deformity. Neuro:  Nonfocal  Psych: Normal affect   Labs    High Sensitivity Troponin:   Recent Labs  Lab 01/28/19 2120 01/28/19 2313 01/29/19 1107  TROPONINIHS 15,519* 15,715* 11,893*      Chemistry Recent Labs  Lab 01/31/19 02/02/19 02/01/19 0547 02/02/19 0502 02/03/19 0621  NA 137 136 137 138  K 3.6 3.5 3.6 3.2*  CL 106 106 104 105  CO2 16* 17* 20* 20*  GLUCOSE 151* 131* 142* 83  BUN 50* 44* 36* 32*  CREATININE 1.80* 1.78* 1.57* 1.48*  CALCIUM 8.0* 7.9* 7.7* 7.7*  PROT 5.4* 5.2* 5.0*  --   ALBUMIN 2.0* 1.9* 1.9*  --   AST 114* 44* 33  --   ALT 105* 64* 49*  --   ALKPHOS 118 111 103  --   BILITOT 0.9 0.7 0.8  --   GFRNONAA 33* 34* 39* 42*  GFRAA 39* 39* 46* 49*  ANIONGAP 15 13 13 13      Hematology Recent Labs  Lab 02/01/19 0547 02/02/19 0502 02/03/19 0621  WBC 12.1* 11.8* 10.4  RBC 3.80* 3.87* 3.77*  HGB 10.5* 10.7* 10.5*  HCT 31.8* 33.5* 32.9*  MCV 83.7 86.6 87.3  MCH 27.6  27.6 27.9  MCHC 33.0 31.9 31.9  RDW 13.7 14.1 14.2  PLT 424* 444* 410*    BNP Recent Labs  Lab 01/29/19 1107 02/01/19 0547  BNP 4,321.1* 2,302.2*     DDimer No results for input(s): DDIMER in the last 168 hours.   Radiology    DG Swallowing Func-Speech Pathology  Result Date: 02/02/2019 Objective Swallowing Evaluation: Type of Study: MBS-Modified Barium Swallow Study  Patient Details Name: Adam Leon MRN: [id number] Date of Birth: [birth year] Today's Date: 02/02/2019 Time: SLP Start Time (ACUTE ONLY): 1220 -SLP Stop Time (ACUTE ONLY): 1245 SLP Time Calculation (min) (ACUTE ONLY): 25 min Past Medical History: Past Medical History: Diagnosis Date . Hypercholesteremia  . Syncope  . Syncope  . Ventricular hypertrophy  Past Surgical History: Past Surgical History: Procedure Laterality Date . IR ANGIO INTRA EXTRACRAN SEL COM CAROTID INNOMINATE UNI L MOD SED  01/29/2019 . IR ANGIO VERTEBRAL SEL SUBCLAVIAN INNOMINATE UNI R MOD SED  01/29/2019 . IR CT HEAD LTD  01/29/2019 . IR  PERCUTANEOUS ART THROMBECTOMY/INFUSION INTRACRANIAL INC DIAG ANGIO  01/29/2019 HPI: Barre Aydelott is a 85 y.o. male who has a PMH including but not limited to syncope, HLD.  He presented to [facility] ED 12/16 with left hemiplegia.  CTA showed R ICA occlusion.  He was subsequently taken to IR for arteriogram with complete revascularization of occluded extracranial and intracranial R ICA. CXR 12/17: interstitial edema.  CT 12/16: R MCA infarct in deep white matter  Subjective: Pt seen in radiology for MBS. Assessment / Plan / Recommendation CHL IP CLINICAL IMPRESSIONS 02/02/2019 Clinical Impression Pt seen in radiology for MBS to objectively assess swallow function and safety, based on clinical presentation at bedside. Pt was uprght in chair, awake and alert. He is hard of hearing. Pt was given trials of nectar thick liquid, thin liquid, puree, and solid textures. He was also given a barium tablet in water.  Orally, pt exhibited oral holding and piecemeal swallow, with premature spillage to the vallecular sinus. Pharyngeal swallow is characterized by trigger of swallow reflex at the vallecular sinus across consistencies. Very trace subepiglottic penetration was noted during the swallow of thin and nectar thick liquids, however, penetrate cleared completely and spontaeously and was not aspirated. Mild lateral channel and vallecular residue was noted after the swallow on most consistencies, which cleared with second/dry swallow. Barium tablet cleared the oral cavity without delay, but was noted to pause in the vallecular sinus before clearing with second swallow. Esophageal sweep revealed it to be clear. Recommend continuing with Dys 2 solids and thin liquids, meds either whole or crushed in puree (based on pt mentation, which is likely to vary due to dx dementia). Recommend assistance with feeding as needed, again based on pt mentation. Safe swallow precautions were reviewed with RN, and sent in written form with transport  to be placed at [facility]. SLP will follow up to assess diet tolerance and continue education.  SLP Visit Diagnosis Dysphagia, oropharyngeal phase (R13.12)     Impact on safety and function Mild aspiration risk;Moderate aspiration risk   CHL IP TREATMENT RECOMMENDATION 02/02/2019 Treatment Recommendations Therapy as outlined in treatment plan below   Prognosis 02/02/2019 Prognosis for Safe Diet Advancement Fair Barriers to Reach Goals Cognitive deficits   CHL IP DIET RECOMMENDATION 02/02/2019 SLP Diet Recommendations Dysphagia 2 (Fine chop) solids;Thin liquid Liquid Administration via Cup;Straw Medication Administration Other (Comment) Compensations Minimize environmental distractions;Slow rate;Small sips/bites;Multiple dry swallows after each bite/sip Postural Changes Remain semi-upright after after feeds/meals (Comment);Seated  upright at 90 degrees   CHL IP OTHER RECOMMENDATIONS 02/02/2019   Oral Care Recommendations Oral care QID     CHL IP FOLLOW UP RECOMMENDATIONS 01/30/2019 Follow up Recommendations Inpatient Rehab   CHL IP FREQUENCY AND DURATION 02/02/2019 Speech Therapy Frequency (ACUTE ONLY) min 2x/week Treatment Duration 2 weeks      CHL IP ORAL PHASE 02/02/2019 Oral Phase Impaired   Oral - Nectar Cup Holding of bolus;Delayed oral transit;Premature spillage   Oral - Thin Cup/Straw Piecemeal swallowing;Premature spillage   Oral - Puree Holding of bolus;Piecemeal swallowing;Premature spillage   Oral - Regular Piecemeal swallowing;Nasal reflux;Premature spillage   Oral - Pill WFL    CHL IP PHARYNGEAL PHASE 02/02/2019 Pharyngeal Phase Impaired   Pharyngeal- Nectar Cup Delayed swallow initiation-vallecula;Reduced airway/laryngeal closure;Penetration/Aspiration during swallow;Pharyngeal residue - valleculae Pharyngeal Material does not enter airway;Material enters airway, remains ABOVE vocal cords then ejected out   Pharyngeal- Thin Cup Delayed swallow initiation-vallecula;Reduced airway/laryngeal  closure;Penetration/Apiration after swallow;Pharyngeal residue - valleculae;Pharyngeal residue - pyriform;Lateral channel residue Pharyngeal Material does not enter airway;Material enters airway, remains ABOVE vocal cords then ejected out   Pharyngeal- Puree Delayed swallow initiation-vallecula   Pharyngeal- Regular Delayed swallow initiation-vallecula;Pharyngeal residue - valleculae   Pharyngeal- Pill Delayed swallow initiation-vallecula Pill paused in the vallecular sinus briefly before clearing the pharynx      CHL IP CERVICAL ESOPHAGEAL PHASE 02/02/2019 Cervical Esophageal Phase Leonarda SalonWFL Harlow Asaelia B, MSP, CCC-SLP Speech Language Pathologist Office: [phone] Pager: [phone] Leigh AuroraBueche, Celia Brown 02/02/2019, 1:52 PM               Cardiac Studies   CT head 12/16 > no acute infarct. CTA head / neck 12/16 >6713ml core infarct on right. Occlusion of right ICA.  MRI/MRAbrain 12/17  IMPRESSION: 1. Severely motion degraded examination. 2. Scattered small acute to early subacute infarcts in the cerebrum and cerebellum bilaterally, greatest in the right frontoparietal region. 3. Limited head MRA due to motion with patency of the large intracranial arteries as above.  Echo 12/17 1. Left ventricular ejection fraction, by visual estimation, is <20%. The left ventricle has severely decreased function. Left ventricular septal wall thickness was mildly increased. Mildly increased left ventricular posterior wall thickness. 2. There is apical ballooning with akinesis of the entire apical wall. There is akinesis of the mid infero and anteroseptum. There is akinesis of the mid and apical inferior, anterior and lateral walls and akineseis of the apical inferolateral wall. 3. Moderate, fixed thrombus on the apical wall of the left ventricle. 4. Definity contrast agent was given IV to delineate the left ventricular endocardial borders. 5. Global right ventricle has normal systolic function.The right  ventricular size is normal. No increase in right ventricular wall thickness. 6. Left atrial size was normal. 7. Right atrial size was normal. 8. The mitral valve is normal in structure. Trivial mitral valve regurgitation. No evidence of mitral stenosis. 9. The tricuspid valve is normal in structure. Tricuspid valve regurgitation is mild. 10. The aortic valve is tricuspid. Aortic valve regurgitation is not visualized. No evidence of aortic valve sclerosis or stenosis. 11. The pulmonic valve was normal in structure. Pulmonic valve regurgitation is not visualized. 12. Normal pulmonary artery systolic pressure. 13. The inferior vena cava is normal in size with greater than 50% respiratory variability, suggesting right atrial pressure of 3 mmHg. 14. Left ventricular diastolic parameters are consistent with Grade I diastolic dysfunction (impaired relaxation).  Patient Profile     85 y.o. male with hyperlipidemia, hypertension, diabetes, dementia who presented with  who presented with right-sided gaze preference and left-sided hemiplegia. Subsequently underwent thrombectomy of left carotid/internal carotid. Cardiac high-sensitivity troponins were elevated on admission and EKG demonstrated anterior ST elevation. ST elevation has persisted, enzymes are decreasing, thrombus in LV apex, felt to be the source of the patient's stroke on an embolic basis.  Assessment & Plan    1. Probable anterolateral MI: prior to admission resulting in marked wall motion abnormality with apical thrombus and EF less than 20%. [place] [address]. ECG with ST elevation anterolaterally. Repeat ECG showed Q waves V1 through V3 with persistent ST elevation anterolaterally. Current plan is for medical management. No reports of chest pain after working with PT yesterday. -- on ASA and will resume statin today.   2. 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 Residual left hemiparesis. Warfarin started for anticoagulation, INR 1.9 today. With concern for hemorraghic conversion, IV heparin was stopped.  -- PharmD following to dose coumadin.  3. Ischemic cardiomyopathy with EF estimate on echo less than 20%: tolerating low dose of coreg, but no room to further titrate with soft blood pressures.  -- no signs of volume overload.  4. AKI: Creatinine 2.32 >>1.8 >>1.78>>1.57>>1.48.  5. Atrial fibrillation: Converted to sinus rhythm on IV amiodarone which was started 01/30/2019.Maintaining sinus rhythm. Now converted to amiodarone  BID.   6. LFT elevation: now resolved.  7. Hyperlipidemia: Target LDL less than 70.  -- will restart statin therapy  8. Hypokalemia: replace  For questions or updates, please contact [facility] Please consult [email or web address] for contact info under    Signed, Laverda Page, NP  02/03/2019, 9:11 AM    Agree with note by Laverda Page NP-C  On low-dose carvedilol with systolic blood pressures in the low 90s.  Serum creatinine approximately 1.5.  At this point, I think we will hold off on starting an ACE inhibitor.  We will recheck an EKG.  LV EF in the 20% range with anteroapical wall motion normality and apical clot on Coumadin anticoagulation.  No plans on coronary angiography or intervention at this time.  Patient otherwise is asymptomatic and has had neurologic recovery.  Plans per the neurology service is a transfer to telemetry and ultimately to inpatient rehab.  Will follow EF by 2D echo as an outpatient in several months.  Runell Gess, M.D., FACP, [facility], [facility] [address] [place], [place]  [address]  [phone] 02/03/2019 9:54 AM

## 2019-02-04 ENCOUNTER — Inpatient Hospital Stay (HOSPITAL_COMMUNITY): Payer: Medicare HMO

## 2019-02-04 LAB — BASIC METABOLIC PANEL
Anion gap: 11 (ref 5–15)
BUN: 34 mg/dL — ABNORMAL HIGH (ref 8–23)
CO2: 23 mmol/L (ref 22–32)
Calcium: 8.1 mg/dL — ABNORMAL LOW (ref 8.9–10.3)
Chloride: 104 mmol/L (ref 98–111)
Creatinine, Ser: 1.76 mg/dL — ABNORMAL HIGH (ref 0.61–1.24)
GFR calc Af Amer: 40 mL/min — ABNORMAL LOW (ref >60)
GFR calc non Af Amer: 34 mL/min — ABNORMAL LOW (ref >60)
Glucose, Bld: 170 mg/dL — ABNORMAL HIGH (ref 70–99)
Potassium: 4.7 mmol/L (ref 3.5–5.1)
Sodium: 138 mmol/L (ref 135–145)

## 2019-02-04 LAB — CBC
HCT: 36.7 % — ABNORMAL LOW (ref 39.0–52.0)
Hemoglobin: 12 g/dL — ABNORMAL LOW (ref 13.0–17.0)
MCH: 28.1 pg (ref 26.0–34.0)
MCHC: 32.7 g/dL (ref 30.0–36.0)
MCV: 85.9 fL (ref 80.0–100.0)
Platelets: 474 10*3/uL — ABNORMAL HIGH (ref 150–400)
RBC: 4.27 MIL/uL (ref 4.22–5.81)
RDW: 14.6 % (ref 11.5–15.5)
WBC: 20.8 10*3/uL — ABNORMAL HIGH (ref 4.0–10.5)
nRBC: 0 % (ref 0.0–0.2)

## 2019-02-04 LAB — PROCALCITONIN: Procalcitonin: 0.57 ng/mL

## 2019-02-04 LAB — GLUCOSE, CAPILLARY
Glucose-Capillary: 140 mg/dL — ABNORMAL HIGH (ref 70–99)
Glucose-Capillary: 171 mg/dL — ABNORMAL HIGH (ref 70–99)
Glucose-Capillary: 241 mg/dL — ABNORMAL HIGH (ref 70–99)
Glucose-Capillary: 234 mg/dL — ABNORMAL HIGH (ref 70–99)
Glucose-Capillary: 231 mg/dL — ABNORMAL HIGH (ref 70–99)

## 2019-02-04 LAB — HEPARIN LEVEL (UNFRACTIONATED)
Heparin Unfractionated: 2.04 IU/mL — ABNORMAL HIGH (ref 0.30–0.70)
Heparin Unfractionated: 0.24 IU/mL — ABNORMAL LOW (ref 0.30–0.70)

## 2019-02-04 LAB — PROTIME-INR
INR: 1.8 — ABNORMAL HIGH (ref 0.8–1.2)
Prothrombin Time: 20.5 seconds — ABNORMAL HIGH (ref 11.4–15.2)

## 2019-02-04 LAB — MRSA PCR SCREENING: MRSA by PCR: NEGATIVE

## 2019-02-04 MED ORDER — WARFARIN SODIUM 2 MG PO TABS
2.0000 mg | ORAL_TABLET | Freq: Once | ORAL | Status: AC
  Administered 2019-02-04: 2 mg via ORAL
  Filled 2019-02-04: qty 1

## 2019-02-04 MED ORDER — IPRATROPIUM-ALBUTEROL 0.5-2.5 (3) MG/3ML IN SOLN
3.0000 mL | Freq: Once | RESPIRATORY_TRACT | Status: DC
  Filled 2019-02-04: qty 3

## 2019-02-04 MED ORDER — HEPARIN (PORCINE) 25000 UT/250ML-% IV SOLN
1000.0000 [IU]/h | INTRAVENOUS | Status: DC
  Administered 2019-02-04: 1200 [IU]/h via INTRAVENOUS
  Administered 2019-02-05: 1350 [IU]/h via INTRAVENOUS
  Administered 2019-02-06: 1050 [IU]/h via INTRAVENOUS
  Filled 2019-02-04 (×3): qty 250

## 2019-02-04 MED ORDER — FUROSEMIDE 10 MG/ML IJ SOLN
40.0000 mg | Freq: Once | INTRAMUSCULAR | Status: AC
  Administered 2019-02-04: 40 mg via INTRAVENOUS
  Filled 2019-02-04: qty 4

## 2019-02-04 MED ORDER — QUETIAPINE FUMARATE 25 MG PO TABS
12.5000 mg | ORAL_TABLET | Freq: Every day | ORAL | Status: DC
  Administered 2019-02-04 – 2019-02-08 (×5): 12.5 mg via ORAL
  Filled 2019-02-04 (×5): qty 1

## 2019-02-04 MED ORDER — HALOPERIDOL 1 MG PO TABS
1.0000 mg | ORAL_TABLET | Freq: Once | ORAL | Status: AC
  Administered 2019-02-04: 1 mg via ORAL
  Filled 2019-02-04: qty 1

## 2019-02-04 NOTE — Progress Notes (Addendum)
Progress Note  Patient Name: Adam Leon Date of Encounter: 02/04/2019  Primary Cardiologist: Fransico Him, MD   Subjective   Required Bipap overnight, but now weaned to [facility]. Mitten restraints.   Inpatient Medications    Scheduled Meds: . amiodarone  200 mg Oral BID  . aspirin  81 mg Oral Daily  . atorvastatin  40 mg Oral q1800  . carvedilol  3.125 mg Oral BID WC  . chlorhexidine  15 mL Mouth Rinse BID  . Chlorhexidine Gluconate Cloth  6 each Topical Daily  . feeding supplement (ENSURE ENLIVE)  237 mL Oral BID BM  . insulin aspart  0-15 Units Subcutaneous Q4H  . ipratropium-albuterol  3 mL Nebulization Once  . mouth rinse  15 mL Mouth Rinse q12n4p  . pantoprazole (PROTONIX) IV  40 mg Intravenous Q12H  . sodium bicarbonate  650 mg Oral TID  . sodium chloride flush  10-40 mL Intracatheter Q12H  . Warfarin - Pharmacist Dosing Inpatient   Does not apply q1800   Continuous Infusions:  PRN Meds: acetaminophen **OR** acetaminophen (TYLENOL) oral liquid 160 mg/5 mL **OR** acetaminophen, iohexol, ondansetron (ZOFRAN) IV, polyethylene glycol, senna-docusate, sodium chloride flush   Vital Signs    Vitals:   02/04/19 0357 02/04/19 0409 02/04/19 0742 02/04/19 0807  BP:  (!) 92/57 98/70   Pulse: 78 70 76 82  Resp:  18 (!) 22 (!) 21  Temp:  97.8 F (36.6 C) 98.2 F (36.8 C)   TempSrc:  Axillary Axillary   SpO2: 100% 100% 100% 98%  Weight:      Height:        Intake/Output Summary (Last 24 hours) at 02/04/2019 1012 Last data filed at 02/04/2019 0644 Gross per 24 hour  Intake --  Output 1575 ml  Net -1575 ml   Last 3 Weights 01/28/2019  Weight (lbs) 195 lb  Weight (kg) 88.451 kg      Telemetry    SR - Personally Reviewed  ECG    No new tracing this morning.  Physical Exam  Confused frail older WM GEN: No acute distress.   Neck: No JVD Cardiac: RRR, no murmurs, rubs, or gallops.  Respiratory: Clear to auscultation bilaterally. GI: Soft, nontender,  non-distended  MS: No edema; No deformity. Mitten restraints.  Neuro:  Nonfocal   Labs    High Sensitivity Troponin:   Recent Labs  Lab 01/28/19 2120 01/28/19 2313 01/29/19 1107  TROPONINIHS 15,519* 15,715* 11,893*      Chemistry Recent Labs  Lab 01/31/19 2585 02/01/19 0547 02/02/19 0502 02/03/19 0621 02/04/19 0342  NA 137 136 137 138 138  K 3.6 3.5 3.6 3.2* 4.7  CL 106 106 104 105 104  CO2 16* 17* 20* 20* 23  GLUCOSE 151* 131* 142* 83 170*  BUN 50* 44* 36* 32* 34*  CREATININE 1.80* 1.78* 1.57* 1.48* 1.76*  CALCIUM 8.0* 7.9* 7.7* 7.7* 8.1*  PROT 5.4* 5.2* 5.0*  --   --   ALBUMIN 2.0* 1.9* 1.9*  --   --   AST 114* 44* 33  --   --   ALT 105* 64* 49*  --   --   ALKPHOS 118 111 103  --   --   BILITOT 0.9 0.7 0.8  --   --   GFRNONAA 33* 34* 39* 42* 34*  GFRAA 39* 39* 46* 49* 40*  ANIONGAP 15 13 13 13 11      Hematology Recent Labs  Lab 02/02/19 0502 02/03/19 2778 02/04/19 0342  WBC 11.8* 10.4 20.8*  RBC 3.87* 3.77* 4.27  HGB 10.7* 10.5* 12.0*  HCT 33.5* 32.9* 36.7*  MCV 86.6 87.3 85.9  MCH 27.6 27.9 28.1  MCHC 31.9 31.9 32.7  RDW 14.1 14.2 14.6  PLT 444* 410* 474*    BNP Recent Labs  Lab 01/29/19 1107 02/01/19 0547  BNP 4,321.1* 2,302.2*     DDimer No results for input(s): DDIMER in the last 168 hours.   Radiology    DG CHEST PORT 1 VIEW  Result Date: 02/04/2019 CLINICAL DATA:  Increased shortness of breath EXAM: PORTABLE CHEST 1 VIEW COMPARISON:  January 29, 2019 FINDINGS: The heart size and mediastinal contours are unchanged. There is patchy/fluffy airspace opacities seen predominantly within the perihilar region with increased interstitial markings throughout both lungs. No pleural effusion is seen. No acute osseous abnormality. IMPRESSION: Interval development of patchy/fluffy perihilar airspace opacities. This could be due to asymmetric pulmonary edema and/or infectious etiology. Electronically Signed   By: Jonna Clark M.D.   On: 02/04/2019  00:35   DG Swallowing Func-Speech Pathology  Result Date: 02/02/2019 Objective Swallowing Evaluation: Type of Study: MBS-Modified Barium Swallow Study  Patient Details Name: Adam Leon MRN: [id number] Date of Birth: 04-05-1930 Today's Date: 02/02/2019 Time: SLP Start Time (ACUTE ONLY): 1220 -SLP Stop Time (ACUTE ONLY): 1245 SLP Time Calculation (min) (ACUTE ONLY): 25 min Past Medical History: Past Medical History: Diagnosis Date . Hypercholesteremia  . Syncope  . Syncope  . Ventricular hypertrophy  Past Surgical History: Past Surgical History: Procedure Laterality Date . IR ANGIO INTRA EXTRACRAN SEL COM CAROTID INNOMINATE UNI L MOD SED  01/29/2019 . IR ANGIO VERTEBRAL SEL SUBCLAVIAN INNOMINATE UNI R MOD SED  01/29/2019 . IR CT HEAD LTD  01/29/2019 . IR PERCUTANEOUS ART THROMBECTOMY/INFUSION INTRACRANIAL INC DIAG ANGIO  01/29/2019 HPI: Adam Leon is a 85 y.o. male who has a PMH including but not limited to syncope, HLD.  He presented to [facility] ED 12/16 with left hemiplegia.  CTA showed R ICA occlusion.  He was subsequently taken to IR for arteriogram with complete revascularization of occluded extracranial and intracranial R ICA. CXR 12/17: interstitial edema.  CT 12/16: R MCA infarct in deep white matter  Subjective: Pt seen in radiology for MBS. Assessment / Plan / Recommendation CHL IP CLINICAL IMPRESSIONS 02/02/2019 Clinical Impression Pt seen in radiology for MBS to objectively assess swallow function and safety, based on clinical presentation at bedside. Pt was uprght in chair, awake and alert. He is hard of hearing. Pt was given trials of nectar thick liquid, thin liquid, puree, and solid textures. He was also given a barium tablet in water.  Orally, pt exhibited oral holding and piecemeal swallow, with premature spillage to the vallecular sinus. Pharyngeal swallow is characterized by trigger of swallow reflex at the vallecular sinus across consistencies. Very trace subepiglottic penetration was noted  during the swallow of thin and nectar thick liquids, however, penetrate cleared completely and spontaeously and was not aspirated. Mild lateral channel and vallecular residue was noted after the swallow on most consistencies, which cleared with second/dry swallow. Barium tablet cleared the oral cavity without delay, but was noted to pause in the vallecular sinus before clearing with second swallow. Esophageal sweep revealed it to be clear. Recommend continuing with Dys 2 solids and thin liquids, meds either whole or crushed in puree (based on pt mentation, which is likely to vary due to dx dementia). Recommend assistance with feeding as needed, again based on pt mentation. Safe swallow precautions  were reviewed with RN, and sent in written form with transport to be placed at [facility]. SLP will follow up to assess diet tolerance and continue education.  SLP Visit Diagnosis Dysphagia, oropharyngeal phase (R13.12)     Impact on safety and function Mild aspiration risk;Moderate aspiration risk   CHL IP TREATMENT RECOMMENDATION 02/02/2019 Treatment Recommendations Therapy as outlined in treatment plan below   Prognosis 02/02/2019 Prognosis for Safe Diet Advancement Fair Barriers to Reach Goals Cognitive deficits   CHL IP DIET RECOMMENDATION 02/02/2019 SLP Diet Recommendations Dysphagia 2 (Fine chop) solids;Thin liquid Liquid Administration via Cup;Straw Medication Administration Other (Comment) Compensations Minimize environmental distractions;Slow rate;Small sips/bites;Multiple dry swallows after each bite/sip Postural Changes Remain semi-upright after after feeds/meals (Comment);Seated upright at 90 degrees   CHL IP OTHER RECOMMENDATIONS 02/02/2019   Oral Care Recommendations Oral care QID     CHL IP FOLLOW UP RECOMMENDATIONS 01/30/2019 Follow up Recommendations Inpatient Rehab   CHL IP FREQUENCY AND DURATION 02/02/2019 Speech Therapy Frequency (ACUTE ONLY) min 2x/week Treatment Duration 2 weeks      CHL IP ORAL PHASE  02/02/2019 Oral Phase Impaired   Oral - Nectar Cup Holding of bolus;Delayed oral transit;Premature spillage   Oral - Thin Cup/Straw Piecemeal swallowing;Premature spillage   Oral - Puree Holding of bolus;Piecemeal swallowing;Premature spillage   Oral - Regular Piecemeal swallowing;Nasal reflux;Premature spillage   Oral - Pill WFL    CHL IP PHARYNGEAL PHASE 02/02/2019 Pharyngeal Phase Impaired   Pharyngeal- Nectar Cup Delayed swallow initiation-vallecula;Reduced airway/laryngeal closure;Penetration/Aspiration during swallow;Pharyngeal residue - valleculae Pharyngeal Material does not enter airway;Material enters airway, remains ABOVE vocal cords then ejected out   Pharyngeal- Thin Cup Delayed swallow initiation-vallecula;Reduced airway/laryngeal closure;Penetration/Apiration after swallow;Pharyngeal residue - valleculae;Pharyngeal residue - pyriform;Lateral channel residue Pharyngeal Material does not enter airway;Material enters airway, remains ABOVE vocal cords then ejected out   Pharyngeal- Puree Delayed swallow initiation-vallecula   Pharyngeal- Regular Delayed swallow initiation-vallecula;Pharyngeal residue - valleculae   Pharyngeal- Pill Delayed swallow initiation-vallecula Pill paused in the vallecular sinus briefly before clearing the pharynx      CHL IP CERVICAL ESOPHAGEAL PHASE 02/02/2019 Cervical Esophageal Phase Leonarda Salon Harlow Asa, MSP, CCC-SLP Speech Language Pathologist Office: [phone] Pager: [phone] Leigh Aurora 02/02/2019, 1:52 PM               Cardiac Studies   CT head 12/16 > no acute infarct. CTA head / neck 12/16 >53ml core infarct on right. Occlusion of right ICA.  MRI/MRAbrain 12/17  IMPRESSION: 1. Severely motion degraded examination. 2. Scattered small acute to early subacute infarcts in the cerebrum and cerebellum bilaterally, greatest in the right frontoparietal region. 3. Limited head MRA due to motion with patency of the large intracranial arteries as  above.  Echo 12/17 1. Left ventricular ejection fraction, by visual estimation, is <20%. The left ventricle has severely decreased function. Left ventricular septal wall thickness was mildly increased. Mildly increased left ventricular posterior wall thickness. 2. There is apical ballooning with akinesis of the entire apical wall. There is akinesis of the mid infero and anteroseptum. There is akinesis of the mid and apical inferior, anterior and lateral walls and akineseis of the apical inferolateral wall. 3. Moderate, fixed thrombus on the apical wall of the left ventricle. 4. Definity contrast agent was given IV to delineate the left ventricular endocardial borders. 5. Global right ventricle has normal systolic function.The right ventricular size is normal. No increase in right ventricular wall thickness. 6. Left atrial size was normal. 7. Right atrial size was normal.  8. The mitral valve is normal in structure. Trivial mitral valve regurgitation. No evidence of mitral stenosis. 9. The tricuspid valve is normal in structure. Tricuspid valve regurgitation is mild. 10. The aortic valve is tricuspid. Aortic valve regurgitation is not visualized. No evidence of aortic valve sclerosis or stenosis. 11. The pulmonic valve was normal in structure. Pulmonic valve regurgitation is not visualized. 12. Normal pulmonary artery systolic pressure. 13. The inferior vena cava is normal in size with greater than 50% respiratory variability, suggesting right atrial pressure of 3 mmHg. 14. Left ventricular diastolic parameters are consistent with Grade I diastolic dysfunction (impaired relaxation).  Patient Profile     85 y.o. male with hyperlipidemia, hypertension, diabetes, dementia who presented with who presented with right-sided gaze preference and left-sided hemiplegia. Subsequently underwent thrombectomy of left carotid/internal carotid. Cardiac high-sensitivity troponins were elevated on  admission and EKG demonstrated anterior ST elevation. ST elevation has persisted, enzymes are decreasing, thrombus in LV apex, felt to be the source of the patient's stroke on an embolic basis.  Assessment & Plan    1. Probable anterolateral VQ:QVZDG to admission resulting in marked wall motion abnormality with apical thrombus and EF less than 20%.HsT peaked at [address].ECG with ST elevation anterolaterally. Repeat ECG showedQ waves V1 through V3 with persistent ST elevation anterolaterally. Current plan is for medical management. No reports of chest pain after working with PT. -- on ASA and statin  2. 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 Residual left hemiparesis. Warfarin started for anticoagulation, INR 1.8 today. With concern for hemorraghic conversion, IV heparin was stopped. Now with continued subtherapeutic INR will plan to resume. -- PharmD following to dose coumadin.  3. Ischemic cardiomyopathy with EF estimate on echo less than 20%:tolerating low dose of coreg, but no room to further titrate with soft blood pressures.  -- no signs of volume overload.  4. AKI: Creatinine 2.32 >>1.8 >>1.78>>1.57>>1.48>>1.76 -- received lasix last evening  5. Atrial fibrillation: Converted to sinus rhythm on IV amiodarone which was started 01/30/2019.Maintaining sinus rhythm.Now converted to amiodarone  BID.  6. LFT elevation:now resolved.  7. Hyperlipidemia:Target LDL less than 70.  -- now on statin therapy  8. Acute respiratory distress: required the use of Bipap and IV lasix overnight. CXR with edema vs infiltrate. Good UOP noted with lasix. Now weaned to [facility]. Remains confused, seems more so than baseline.   -- check BNP -- WBC up to 20.8, but afebrile. Need to rule infectious process.   For questions or updates, please contact [facility] Please consult [email or web address] for  contact info under        Signed, Laverda Page, NP  02/04/2019, 10:12 AM    Agree with note by Laverda Page NP-C  Events of last night noted.  He had respiratory distress requiring BiPAP in the nasal cannula.  He is now off supplemental oxygen.  His chest x-ray showed patchy infiltrates consistent with either pulmonary edema plus or minus infection.  His white count was 20,000.  He did receive 40 mg of Lasix and had good urine output.  He denies shortness of breath now.  His serum creatinine did increase from 1.5-1.75.  His blood pressure is soft making additional medications difficult to initiate.  I spoke to his daughter who is going to review his will with his her brother with regards to goals of care and DNR.  His EF is less than 20%.  Currently in sinus rhythm.  His  lungs are clear on exam.  He is somewhat confused.  He may benefit from being on a low maintenance dose of oral furosemide (20 mg) to prevent further episodes of decompensation.  This would be temporized by his blood pressure and renal function.  No further therapeutic recommendations at this time.  Runell GessJonathan J. Tamaka Sawin, M.D., FACP, [facility]FACC, Earl LagosFAHA, [facility]SC[facility] [address] [place], [place]  [id number]  [phone] 02/04/2019 1:06 PM

## 2019-02-04 NOTE — Progress Notes (Signed)
OT Cancellation Note  Patient Details Name: Kermitt Harjo MRN: [id number] DOB: 06/28/1930   Cancelled Treatment:    Reason Eval/Treat Not Completed: Other (comment);Fatigue/lethargy limiting ability to participate Pt declined OT session, stating" I am too cold right now." Will check back as time allows.Lanier Clam., COTA/L Acute Rehabilitation Services [phone]   Ihor Gully 02/04/2019, 4:38 PM

## 2019-02-04 NOTE — Progress Notes (Signed)
Becoming more and more agitated and confused and thinks he has glasses on and keeps trying to remove the NRB mask.  Sats are jumping from 100% down to 89% then back.  His HR jumped from 75-147 and back.  His color went from pale to flushed back to gray again.  Still sound like upper airway exp wheezing with fine crackles.  Did receive lasix 40 mg about 30 minutes ago but has not put out much urine at this time.

## 2019-02-04 NOTE — Progress Notes (Addendum)
STROKE TEAM PROGRESS NOTE   INTERVAL HISTORY Pt seen with RN at bedsdie.Pt became sob and confused overnight.CXR showed pulmonary edema. JVD and accessory muscles of respiration noted on exam. He was given IV Lasix and case d/w cardiology. Bipap was used as well. BPs remain low and he is confused this am and req restraints as he just pulled out his IV.   Vitals:   02/04/19 0742 02/04/19 0807 02/04/19 0900 02/04/19 1152  BP: 98/70   (!) 92/49  Pulse: 76 82  78  Resp: (!) 22 (!) 21  (!) 22  Temp: 98.2 F (36.8 C)   97.7 F (36.5 C)  TempSrc: Axillary   Axillary  SpO2: 100% 98% 96% 95%  Weight:      Height:        CBC:  Recent Labs  Lab 01/29/19 0240 01/29/19 1355 02/03/19 0621 02/04/19 0342  WBC 17.4* 18.6* 10.4 20.8*  NEUTROABS 14.5* 14.8*  --   --   HGB 11.8* 11.3* 10.5* 12.0*  HCT 35.8* 33.2* 32.9* 36.7*  MCV 83.1 81.4 87.3 85.9  PLT 406* 428* 410* 474*    Basic Metabolic Panel:  Recent Labs  Lab 01/30/19 1125 02/03/19 0621 02/04/19 0342  NA  --  138 138  K  --  3.2* 4.7  CL  --  105 104  CO2  --  20* 23  GLUCOSE  --  83 170*  BUN  --  32* 34*  CREATININE  --  1.48* 1.76*  CALCIUM  --  7.7* 8.1*  MG 2.0  --   --    Lipid Panel:     Component Value Date/Time   CHOL 123 01/29/2019 0222   TRIG 244 (H) 01/29/2019 0222   HDL <10 (L) 01/29/2019 0222   CHOLHDL NOT CALCULATED 01/29/2019 0222   VLDL 49 (H) 01/29/2019 0222   LDLCALC NOT CALCULATED 01/29/2019 0222   HgbA1c:  Lab Results  Component Value Date   HGBA1C 8.8 (H) 01/29/2019   Urine Drug Screen: No results found for: LABOPIA, COCAINSCRNUR, LABBENZ, AMPHETMU, THCU, LABBARB  Alcohol Level     Component Value Date/Time   ETH <10 01/28/2019 2120    IMAGING  DG CHEST PORT 1 VIEW  Result Date: 02/04/2019 CLINICAL DATA:  Increased shortness of breath EXAM: PORTABLE CHEST 1 VIEW COMPARISON:  January 29, 2019 FINDINGS: The heart size and mediastinal contours are unchanged. There is patchy/fluffy  airspace opacities seen predominantly within the perihilar region with increased interstitial markings throughout both lungs. No pleural effusion is seen. No acute osseous abnormality. IMPRESSION: Interval development of patchy/fluffy perihilar airspace opacities. This could be due to asymmetric pulmonary edema and/or infectious etiology. Electronically Signed   By: Prudencio Pair M.D.   On: 02/04/2019 00:35   Cerebral Angiogram S/P bilateral common carotid and RT Vert arteriogram followed by complete revascularization of occluded extracranial and intracranial RT ICA with x 2 passes with 44mmx 40 mm solitaireX retriever device and penumbra aspiration achieving a TICI 3  Rt MCA revascularization.   PHYSICAL EXAM  Temp:  [97.3 F (36.3 C)-98.2 F (36.8 C)] 97.7 F (36.5 C) (12/23 1152) Pulse Rate:  [30-82] 78 (12/23 1152) Resp:  [16-36] 22 (12/23 1152) BP: (83-102)/(49-70) 92/49 (12/23 1152) SpO2:  [92 %-100 %] 95 % (12/23 1152) FiO2 (%):  [50 %-70 %] 50 % (12/23 0409)  General - frail elderly caucasian male in moderate  distress.  Ophthalmologic - fundi not visualized due to noncooperation.  Cardiovascular - Regular  rhythm and rate, not in afib.  Neuro - awake alert, fluent language, however he is agitated and confused. He is oriented to self only at this time. Able to follow simple commands. No gaze deviation, no hemianopia. However, more right side preference and no further left sided neglect. Tracking on both sides, PERRL. Mild left facial droop. Tongue midline. Left UE 4/5. LLE proximal 4+/5, distal 5/5 with toe DF and PF. Sensation symmtrical subjectively. FTN intact on the right but dysmetria on the left. Gait not tested.  ASSESSMENT/PLAN Mr. Stark Aguinaga is a 85 y.o. male with history of HLD, HTN, DM presenting after a fall with L sided weakness face, arm and leg.   Stroke: bilateral anterior and posterior infarcts with L ICA occlusion s/p IR w TICI3 revascularization, likely  embolic d/t new AF w/ RVR and LV thrombus in the setting of undiagnosed recent MI  Code Stroke CT head No acute abnormality. pontiue hypodensity. ASPECTS 10.     CTA head & neck R ICA occlusion. Decreased R MCA flow w/ good cross-filling.  CT perfusion 13 mL core infarct R watershed w/ delayed perfusion R MCA d/t R ICA occlusion   Cerebral Angio occlusion intra and extracranial ICA with TICI3 reperfusion    MRI  Scattered small cerebrum and cerebellar bilateral infarcts (greatest R frontoparietal)  MRA  Limited unremarkable  Carotid Doppler  R 1-39% stenosis    2D Echo severe LV dysfunction < 20% w/ possible stress cardiomyopathy w/ apical ballooning and LV thrombus  LDL NTC due to TG 244 and low HDL < 10  Direct LDL - 58.5  HgbA1c 8.8  IV heparin for VTE prophylaxis  aspirin 81 mg daily prior to admission, now on aspirin 325 mg daily and heparin IV. warfarin started 01/31/19  Therapy recommendations:  CIR vs SNF or PC pending GOC discussion w/family  Disposition:  pending   PAF w/ RVR, new diagnosis  Home anticoagulation:  none   CHA2DS2-VASc Score = at least 6, ?2 oral anticoagulation recommended  Age in Years:  ?89   +2    Sex:  Male   0    Hypertension History:  yes   +1     Diabetes Mellitus:  yes   +1  Congestive Heart Failure History:  0  Vascular Disease History:  yes   +1     Stroke/TIA/Thromboembolism History:  yes   +2 . Placed again on IV heparin since INR down & not remaining at goal . Started on amiodarone w/ bolus due to RVR -> Per Dr Tresa Endo. Now PO Amio on 02/01/19 . Converted to sinus rhythm   Abnormal EKG w/ ST elevation LV Systolic Dysunction, LV thrombus  Felt likely reflective of ICA occlusion w/ demand ischemia  EKG w/ concern for anterior MI  Trop 15519->15715->11893   2D Echo severe LV dysfunction < 20% w/ possible stress cardiomyopathy w/ apical ballooning and LV thrombus  No BB d/t low BP  BNP 4321.1->2,302  Cardiology Dr. Katrinka Blazing  on board   Probable anterolateral MI prior to admission per cardiology  Hypotension Hx Hypertension  Home meds:  atenolol 25 daily, lisinopril 5  Low BP felt to be related to AF in setting of severe LV dysfunction  BP goal 120-140 by artery line  PRN levophed for BP goal  Stopped IVF d/t severe LV dysfunction . Long-term BP goal normotensive  Hyperlipidemia  Home meds:  pravachol 40  LDL NTC (TG 244 and HDL < 10), goal < 70  Direct LDL - 58.5  Statin held d/t elevated LFTs - improving - (see below)  Continue statin once LFTs improved  Diabetes type II Uncontrolled  Home meds:  Metformin 500 bid  HgbA1c 8.8, goal < 7.0  CBGs  SSI  Close PCP follow up  UGIB, resolved - ? Stress ulcer  Coffee ground color emesis - resolved   Off NG suction  PPI IV Q12h  On diet now  Other Stroke Risk Factors  Advanced age  MI, LV thrombus and low EF  Other Active Problems  Mild cognitive decline at baseline suspect this is now aggravated by his stroke, heart failure and sundowning.- agitation early AM 01/31/19. Improved today  Physical deconditioning over past 2 weeks going from independent walking to using a walker. Had ER eval 2 wks ago for fatigue and decreased appetite.   AKI 2.32->2.2->1.94->1.91->1.70->1.80->1.78->1.4  Leukocytosis 15.0-17.4-18.6-15.5->15.1->12.1->10.4  Acute blood loss anemia 12.9-13.6-11.8-11.3-10.6->10.6->10.5->10.4  Mild transaminitis ; AST 67-> 68->114->44 ALT 49->50 ALP 133->146->105->64 - improving  CCM signed off 01/31/19  Anticoagulation - Heparin / Coumadin per pharmacy for afib and thrombus - INR - 1.4->2.2->2.8 ->1.9->1.8  Moderate, fixed thrombus on the apical wall of the left ventricle by echo 01/29/19  Potassium - 3.5->3.6->4.7  Hospital day # 7 Despite max med mgt and full supportive efforts, pt con't to decline. [facility] needed to further discuss GOC with Family.   Desiree Metzger-Cihelka,  ARNP-C, ANVP-BC Pager: [phone]  Patient has had intermittent agitation confusion and delirium which I suspect is aggravation of his baseline mild cognitive impairment and is multifactorial due to his stroke, CHF and sundowning.  Recommend low-dose Haldol as needed and start Seroquel 12.5 mg at night.  Await medical peer to peer with [facility] [employer] insurance medical director to discuss rehab appropriateness.  Discussed with patient's daughter at the bedside and answered questions.  I also advised her to consider CODE STATUS and a palliative care approach in case his condition continues to decline and she will discuss with her brother and get back to us discussed with rehab coordinator.  Greater than 50% time during this 25-minute visit was spent on counseling and coordination of care and discussion with care team Delia HeadyPramod Keyerra Lamere, MD 02/04/2019 2:59 PM  To contact Stroke Continuity provider, please refer to [email or web address]. After hours, contact General Neurology

## 2019-02-04 NOTE — Progress Notes (Signed)
Nutrition Follow-up  DOCUMENTATION CODES:   Not applicable  INTERVENTION:  Continue Ensure Enlive po BID, each supplement provides 350 kcal and 20 grams of protein  Encourage adequate PO intake.   NUTRITION DIAGNOSIS:   Inadequate oral intake related to dysphagia as evidenced by meal completion < 50%; improving  GOAL:   Patient will meet greater than or equal to 90% of their needs; met  MONITOR:   Diet advancement, PO intake, Supplement acceptance  REASON FOR ASSESSMENT:   Malnutrition Screening Tool    ASSESSMENT:   Pt with PMH of HLD admitted with R MCA infarct secondary to R ICA occlusion s/p IR for revascularization.  Pt with confusion this AM. MD and staff are aware. Meal completion 90%. Pt currently has Ensure ordered and has been consuming them. RD to continue with current orders to aid in caloric and protein needs. Labs and medications reviewed.   Diet Order:   Diet Order            DIET DYS 2 Room service appropriate? Yes with Assist; Fluid consistency: Thin  Diet effective now              EDUCATION NEEDS:   No education needs have been identified at this time  Skin:  Skin Assessment: Reviewed RN Assessment  Last BM:  12/21  Height:   Ht Readings from Last 1 Encounters:  01/28/19 6' (1.829 m)    Weight:   Wt Readings from Last 1 Encounters:  01/28/19 88.5 kg    Ideal Body Weight:  80.9 kg  BMI:  Body mass index is 26.45 kg/m.  Estimated Nutritional Needs:   Kcal:  2000-2200  Protein:  100-115 grams  Fluid:  >2 L/day    Corrin Parker, MS, RD, LDN Pager # [phone] After hours/ weekend pager # [phone]

## 2019-02-04 NOTE — Significant Event (Addendum)
Rapid Response Event Note  Overview:Called d/t respiratory distress. Time Called: 2352 Arrival Time: 2355 Event Type: Respiratory  Initial Focused Assessment: Pt laying in bed in respiratory distress, +WOB, +accessory muscle use. Pt confused at baseline and trying to pull off NRB mask saying "I need some air.". Pt denies chest pain at this time. T-97.3, HR-79, BP-102/70, RR-36, SpO2-100% on NRB. Lungs wheezy with scattered crackles t/o. Skin cool and clammy. JVD present. Interventions: CBG-191 PCXR-Interval development of patchy/fluffy perihilar airspace opacities. This could be due to asymmetric pulmonary edema and/or infectious etiology. Duoneb x 1 Lasix 40mg  IV x 1 Plan of Care (if not transferred): Monitor response to lasix, bipap if no improvement. PCCM consult if no improvement after interventions.  Update: 0135-Pt still with increased WOB, little response to lasix, SpO2-100% on NRB. Will place on bipap and place foley.  Update: 0300-Per RN, pt resting on bipap and in no distress.     Event Summary:   at      at          [facility], Carren Rang

## 2019-02-04 NOTE — Progress Notes (Signed)
Remains restless but just received IV lasix 40 mg and this nurse explained to him what was going on and what he was getting and why.  He was informed that the medication is to help remove some of the fluid off his lungs and to help him breathe better and calm down.  Adam Leon

## 2019-02-04 NOTE — Care Management Important Message (Signed)
Important Message  Patient Details  Name: Adam Leon MRN: [id number] Date of Birth: 24-Nov-1930   Medicare Important Message Given:  Yes     [name] 02/04/2019, 3:07 PM

## 2019-02-04 NOTE — Progress Notes (Signed)
Was cooperative and calm during assessment at 2325 then approx 5 minutes later he yells out that he cannot breathe and he was diaphoretic and trying to climb out of the bed and saying he could not breathe and very anxious.  He was satting 92-95% on room air then He was placed on O2 at 5 liters and was satting 95% but he kept yelling he could not breathe and something was wrong so I placed nonrebreather on called RT to come assist, as he did have some upper wheezing and some fine crackles throughout.  RT and charge nurse were present and he remained very anxious and restless with resp at 26 so RT said to call Rapid Response so we could get a stat xray and have him further evaluated.  RR came immediately and Xray came and he also had a Duoneb treatment per order.  He remains restless but not as much as he was initially and he is calming down some.  Still remains diaphoretic and feels like impending doom, as he said he feels like he is going to die.  This nurse, charge nurse, RT, and RR at side to assure him he is not alone and to help calm him

## 2019-02-04 NOTE — Progress Notes (Signed)
Appears to be asleep.  Resp 16 on BiPap.  Lungs sound clearer.  No signs of resp distress at this time.  Does have foley in for strict I&O per MD order, as MD felt that condom cath was not sufficient for the purpose of this emergency.  No agitation.

## 2019-02-04 NOTE — Progress Notes (Signed)
Physical Therapy Treatment Patient Details Name: Adam Leon MRN: [id number] DOB: 1930/04/10 Today's Date: 02/04/2019    History of Present Illness [name] is a 85 y.o. male with history of HLD, HTN, DM presenting following a fall with L sided weakness face, arm and leg. R MCA infarct due to L ICA occlusion s/p IR w TICI3 revascularization, likely embolic d/t new AF w/ RVR and LV thrombus in the setting of undiagnosed recent MI    PT Comments    Pt performed limited session this am.  He had hypoxic event last night in which rapid response and respiratory was contacted.  He is more confused and required squat pivot to move from bed to chair.  Pt very lethargic during session.  Will continue with CIR recs and watch for progression.     Follow Up Recommendations  CIR     Equipment Recommendations  None recommended by PT;Other (comment)(TBA)    Recommendations for Other Services       Precautions / Restrictions Precautions Precautions: Fall Precaution Comments: had NG tube Restrictions Weight Bearing Restrictions: No    Mobility  Bed Mobility Overal bed mobility: Needs Assistance Bed Mobility: Supine to Sit     Supine to sit: Max assist     General bed mobility comments: Pt required assistance to move LEs to edge of bed elevate trunk into sitting.  Transfers Overall transfer level: Needs assistance Equipment used: None Transfers: Squat Pivot Transfers     Squat pivot transfers: Mod assist     General transfer comment: assist forward and up.  Cues for hand placement.  Ambulation/Gait Ambulation/Gait assistance: (NT unable today)               Stairs             Wheelchair Mobility    Modified Rankin (Stroke Patients Only)       Balance Overall balance assessment: Needs assistance   Sitting balance-Leahy Scale: Fair       Standing balance-Leahy Scale: Poor                              Cognition  Arousal/Alertness: Awake/alert Behavior During Therapy: WFL for tasks assessed/performed Overall Cognitive Status: Impaired/Different from baseline Area of Impairment: Orientation;Safety/judgement;Following commands                 Orientation Level: Place;Time;Situation;Disoriented to   Memory: Decreased short-term memory;Decreased recall of precautions Following Commands: Follows one step commands inconsistently Safety/Judgement: Decreased awareness of safety;Decreased awareness of deficits   Problem Solving: Slow processing;Decreased initiation;Difficulty sequencing;Requires verbal cues;Requires tactile cues General Comments: Pt very confused asking about gatorade and seeing his mom.      Exercises      General Comments        Pertinent Vitals/Pain Pain Assessment: No/denies pain Faces Pain Scale: No hurt    Home Living                      Prior Function            PT Goals (current goals can now be found in the care plan section) Acute Rehab PT Goals Patient Stated Goal: to go home safely Potential to Achieve Goals: Good Progress towards PT goals: Progressing toward goals    Frequency    Min 3X/week      PT Plan Current plan remains appropriate    Co-evaluation  AM-PAC PT "6 Clicks" Mobility   Outcome Measure  Help needed turning from your back to your side while in a flat bed without using bedrails?: A Little Help needed moving from lying on your back to sitting on the side of a flat bed without using bedrails?: A Little Help needed moving to and from a bed to a chair (including a wheelchair)?: A Little Help needed standing up from a chair using your arms (e.g., wheelchair or bedside chair)?: A Little Help needed to walk in hospital room?: A Lot Help needed climbing 3-5 steps with a railing? : A Lot 6 Click Score: 16    End of Session Equipment Utilized During Treatment: Gait belt Activity Tolerance: Patient  tolerated treatment well Patient left: in chair;with call bell/phone within reach;with family/visitor present Nurse Communication: Mobility status PT Visit Diagnosis: Unsteadiness on feet (R26.81);Other abnormalities of gait and mobility (R26.89);Other symptoms and signs involving the nervous system (R29.898)     Time: 7628-3151 PT Time Calculation (min) (ACUTE ONLY): 36 min  Charges:  $Therapeutic Activity: 23-37 mins                     Bonney Leitz , PTA Acute Rehabilitation Services Pager [phone] Office [phone]     Jenniferlynn Saad Artis Delay 02/04/2019, 12:32 PM

## 2019-02-04 NOTE — Progress Notes (Signed)
Called by RN regarding SOB and CXR that shows interval development of patchy/fluffy perihilar airspace opacities, which could be due to asymmetric pulmonary edema and/or infectious etiology. Of note, his white count yesterday morning was normal at 10.4 and he is afebrile. Is satting 100% at this time on NRB mask. Wheezes on auscultation but not improved with duoneb. Has JVD. Is using accessory muscles of respiration.   Echocardiogram reviewed, EF of < 20% noted.   Administering 40 mg IV Lasix STAT, now.   Discussed with Cardiology service. The patient is not on Lasix as an outpatient and therefore classifiable as naive to Lasix, and he has fairly normal renal function.so likely will not need to use a Lasix dose higher than 40 mg. Per Cardiology, would wait till 5 AM to see what UOP is after this STAT Lasix dose. Will also check back in about 45 minutes to see what the immediate UOP response to the IV Lasix dose is. Provided that he puts out > 500 ml by 5 AM, will not need to redose, otherwise will need to redose with 80 mg IV Lasix at that time. If his breathing does not improve within 45 minutes, may need to consult ICU team.   Ordering a repeat BMP for 7 AM.   Electronically signed: Dr. Kerney Elbe

## 2019-02-04 NOTE — Progress Notes (Signed)
[place] for Heparin/Coumadin Indication: ACS s/p carotid embolectomy and LV thrombus   Patient Measurements: Height: 6' (182.9 cm) Weight: 195 lb (88.5 kg) IBW/kg (Calculated) : 77.6 Heparin weight: 88.5kg  Vital Signs: Temp: 98.2 F (36.8 C) (12/23 0742) Temp Source: Axillary (12/23 0742) BP: 98/70 (12/23 0742) Pulse Rate: 82 (12/23 0807)  Labs: Recent Labs    02/02/19 0502 02/02/19 0724 02/03/19 0621 02/04/19 0342  HGB 10.7*  --  10.5* 12.0*  HCT 33.5*  --  32.9* 36.7*  PLT 444*  --  410* 474*  LABPROT 29.8*  --  22.0* 20.5*  INR 2.8*  --  1.9* 1.8*  HEPARINUNFRC 0.17* 0.12*  --   --   CREATININE 1.57*  --  1.48* 1.76*    Estimated Creatinine Clearance: 33.1 mL/min (A) (by C-G formula based on SCr of 1.76 mg/dL (H)).    Assessment: 85 y.o. male with EKG changes, elevated cardiac markers and LV thrombus s/p CVA and carotid thrombectomy started on warfarin.  -INR down to 1.8 ((trend down; went up to 2.8 48hrs after one dose of 5mg ) -pharmacy to restart heparin   Goal of Therapy:  Heparin level 0.3-0.5 units/mL Monitor platelets by anticoagulation protocol: Yes   Plan:   -Restart heparin at 1200 units/hr -Heparin level in 8 hours and daily wth CBC daily -Give warfarin 2mg  tonight -Daily INR   Hildred Laser, PharmD Clinical Pharmacist **Pharmacist phone directory can now be found on [email or web address] ([email or web address]).  Listed under [place].

## 2019-02-04 NOTE — Progress Notes (Signed)
[facility] for Heparin/Coumadin Indication: ACS s/p carotid embolectomy and LV thrombus   Patient Measurements: Height: 6' (182.9 cm) Weight: 195 lb (88.5 kg) IBW/kg (Calculated) : 77.6 Heparin weight: 88.5kg  Vital Signs: Temp: 97.8 F (36.6 C) (12/23 1500) Temp Source: Axillary (12/23 1500) BP: 91/51 (12/23 1500) Pulse Rate: 77 (12/23 1500)  Labs: Recent Labs    02/02/19 0502 02/02/19 0724 02/03/19 0621 02/04/19 0342 02/04/19 2000 02/04/19 2127  HGB 10.7*  --  10.5* 12.0*  --   --   HCT 33.5*  --  32.9* 36.7*  --   --   PLT 444*  --  410* 474*  --   --   LABPROT 29.8*  --  22.0* 20.5*  --   --   INR 2.8*  --  1.9* 1.8*  --   --   HEPARINUNFRC 0.17* 0.12*  --   --  2.04* 0.24*  CREATININE 1.57*  --  1.48* 1.76*  --   --     Estimated Creatinine Clearance: 33.1 mL/min (A) (by C-G formula based on SCr of 1.76 mg/dL (H)).    Assessment: 85 y.o. male with EKG changes, elevated cardiac markers and LV thrombus s/p CVA and carotid thrombectomy started on warfarin.  -INR down to 1.8 ((trend down; went up to 2.8 48hrs after one dose of 5mg ) -pharmacy to restart heparin. Initial level is slightly below goal at 0.24, no issues per RN.  Goal of Therapy:  Heparin level 0.3-0.5 units/mL Monitor platelets by anticoagulation protocol: Yes   Plan:   -Increase heparin to 1350 units/h -Recheck heparin level in [place], PharmD, BCPS Clinical Pharmacist [phone] Please check AMION for all [place] numbers 02/04/2019

## 2019-02-05 LAB — GLUCOSE, CAPILLARY
Glucose-Capillary: 118 mg/dL — ABNORMAL HIGH (ref 70–99)
Glucose-Capillary: 118 mg/dL — ABNORMAL HIGH (ref 70–99)
Glucose-Capillary: 146 mg/dL — ABNORMAL HIGH (ref 70–99)
Glucose-Capillary: 127 mg/dL — ABNORMAL HIGH (ref 70–99)
Glucose-Capillary: 171 mg/dL — ABNORMAL HIGH (ref 70–99)
Glucose-Capillary: 138 mg/dL — ABNORMAL HIGH (ref 70–99)
Glucose-Capillary: 144 mg/dL — ABNORMAL HIGH (ref 70–99)

## 2019-02-05 LAB — BASIC METABOLIC PANEL
Anion gap: 13 (ref 5–15)
BUN: 35 mg/dL — ABNORMAL HIGH (ref 8–23)
CO2: 28 mmol/L (ref 22–32)
Calcium: 8.4 mg/dL — ABNORMAL LOW (ref 8.9–10.3)
Chloride: 100 mmol/L (ref 98–111)
Creatinine, Ser: 1.6 mg/dL — ABNORMAL HIGH (ref 0.61–1.24)
GFR calc Af Amer: 45 mL/min — ABNORMAL LOW (ref >60)
GFR calc non Af Amer: 38 mL/min — ABNORMAL LOW (ref >60)
Glucose, Bld: 141 mg/dL — ABNORMAL HIGH (ref 70–99)
Potassium: 3.8 mmol/L (ref 3.5–5.1)
Sodium: 141 mmol/L (ref 135–145)

## 2019-02-05 LAB — CBC
HCT: 38.7 % — ABNORMAL LOW (ref 39.0–52.0)
Hemoglobin: 12.2 g/dL — ABNORMAL LOW (ref 13.0–17.0)
MCH: 27.3 pg (ref 26.0–34.0)
MCHC: 31.5 g/dL (ref 30.0–36.0)
MCV: 86.6 fL (ref 80.0–100.0)
Platelets: 444 10*3/uL — ABNORMAL HIGH (ref 150–400)
RBC: 4.47 MIL/uL (ref 4.22–5.81)
RDW: 14.8 % (ref 11.5–15.5)
WBC: 11.9 10*3/uL — ABNORMAL HIGH (ref 4.0–10.5)
nRBC: 0 % (ref 0.0–0.2)

## 2019-02-05 LAB — PROTIME-INR
INR: 1.8 — ABNORMAL HIGH (ref 0.8–1.2)
Prothrombin Time: 20.6 seconds — ABNORMAL HIGH (ref 11.4–15.2)

## 2019-02-05 LAB — BRAIN NATRIURETIC PEPTIDE: B Natriuretic Peptide: 2613.1 pg/mL — ABNORMAL HIGH (ref 0.0–100.0)

## 2019-02-05 LAB — HEPARIN LEVEL (UNFRACTIONATED)
Heparin Unfractionated: 0.82 IU/mL — ABNORMAL HIGH (ref 0.30–0.70)
Heparin Unfractionated: 0.72 IU/mL — ABNORMAL HIGH (ref 0.30–0.70)

## 2019-02-05 MED ORDER — WARFARIN SODIUM 3 MG PO TABS
3.0000 mg | ORAL_TABLET | Freq: Once | ORAL | Status: AC
  Administered 2019-02-05: 3 mg via ORAL
  Filled 2019-02-05: qty 1

## 2019-02-05 MED ORDER — INSULIN ASPART 100 UNIT/ML ~~LOC~~ SOLN
0.0000 [IU] | Freq: Three times a day (TID) | SUBCUTANEOUS | Status: DC
  Administered 2019-02-06: 3 [IU] via SUBCUTANEOUS

## 2019-02-05 MED ORDER — INSULIN ASPART 100 UNIT/ML ~~LOC~~ SOLN: 4.0000 [IU] | Freq: Three times a day (TID) | SUBCUTANEOUS | Status: DC

## 2019-02-05 NOTE — Progress Notes (Signed)
Inpatient Rehabilitation Admissions Coordinator  I have received a denial from [facility]. I have notified daughter at bedside and she is aware as well as RN CM, Vida Roller. Daughter requesting to speak to attending MD about palliative, maybe home with [facility]. I have notified Burnetta Sabin [facility] of daughter's request to speak with Dr. Leonie Man. We will sign off at this time  Danne Baxter, RN, MSN Rehab Admissions Coordinator [phone] 02/05/2019 12:22 PM

## 2019-02-05 NOTE — Progress Notes (Signed)
[place] for Heparin/Coumadin Indication: ACS, S/P carotid embolectomy, and LV thrombus   Patient Measurements: Height: 6' (182.9 cm) Weight: 195 lb (88.5 kg) IBW/kg (Calculated) : 77.6 Heparin weight: 88.5 kg  Vital Signs: Temp: 98 F (36.7 C) (12/24 1908) Temp Source: Axillary (12/24 1908) BP: 99/73 (12/24 1908) Pulse Rate: 81 (12/24 1908)  Labs: Recent Labs    02/03/19 6761 02/03/19 9509 02/04/19 0342 02/04/19 2127 02/05/19 0424 02/05/19 0757 02/05/19 2034  HGB 10.5*  --  12.0*  --  12.2*  --   --   HCT 32.9*  --  36.7*  --  38.7*  --   --   PLT 410*  --  474*  --  444*  --   --   LABPROT 22.0*  --  20.5*  --  20.6*  --   --   INR 1.9*  --  1.8*  --  1.8*  --   --   HEPARINUNFRC  --    < >  --  0.24*  --  0.82* 0.72*  CREATININE 1.48*  --  1.76*  --  1.60*  --   --    < > = values in this interval not displayed.    Estimated Creatinine Clearance: 36.4 mL/min (A) (by C-G formula based on SCr of 1.6 mg/dL (H)).    Assessment: 85 y.o. male with EKG changes, elevated cardiac markers and LV thrombus, S/P CVA and carotid thrombectomy, started on warfarin.   Heparin level ~9 hrs after heparin infusion was decreased to 1200 units/hr is 0.72 units/ml, which remains above the goal range for this pt.  INR down to 1.8 today. H/H 12.2/38.7, platelets 444. Per RN, no issues with IV or bleeding observed.  Goal of Therapy:  Heparin level 0.3-0.5 units/mL INR 2-3 Monitor platelets by anticoagulation protocol: Yes    Plan:   Decrease heparin to 1050 units/hr Check 8-hr heparin level Warfarin 3 mg po x 1 dose tonight Monitor daily heparin level, CBC Monitor signs/symptoms of bleeding  Gillermina Hu, PharmD, BCPS, [facility] Clinical Pharmacist 02/05/2019

## 2019-02-05 NOTE — TOC Progression Note (Signed)
Transition of Care [facility]) - Progression Note    Patient Details  Name: Adam Leon MRN: [id number] Date of Birth: 1930-06-19  Transition of Care [facility]) CM/SW Contact  Pollie Friar, RN Phone Number: 02/05/2019, 1:04 PM  Clinical Narrative:    Patient denied for CIR. CM consulted to speak to the family about hospice care. CM met with the patient, daughter and spouse. Daughter and spouse are in agreement with residential hospice. Daughter requesting [facility]. CM has called Anderson Malta with [facility] and place referral.  TOC following.   Expected Discharge Plan: [place] Barriers to Discharge: Continued Medical Work up  Expected Discharge Plan and Services Expected Discharge Plan: [place]     Post Acute Care Choice: Hospice                                         Social Determinants of Health (SDOH) Interventions    Readmission Risk Interventions No flowsheet data found.

## 2019-02-05 NOTE — Progress Notes (Signed)
STROKE TEAM PROGRESS NOTE   INTERVAL HISTORY Pt seen with wife and daughter at bedsdie.Pt remains   confused this am but can be redirected. BP doing better. Family now leaning towards palliativce care approach and interested in [facility]. Inpatient rehab has been denied and they do not want SNF rehab and will not be able to care for him at home.  Vitals:   02/05/19 0344 02/05/19 0708 02/05/19 0813 02/05/19 0830  BP: (!) 98/58 97/62  105/62  Pulse: 70 72  71  Resp:  (!) 29 (!) 24 (!) 22  Temp: 97.8 F (36.6 C) 97.7 F (36.5 C)    TempSrc: Axillary Oral    SpO2: 97% 94%  96%  Weight:      Height:        CBC:  Recent Labs  Lab 02/04/19 0342 02/05/19 0424  WBC 20.8* 11.9*  HGB 12.0* 12.2*  HCT 36.7* 38.7*  MCV 85.9 86.6  PLT 474* 444*    Basic Metabolic Panel:  Recent Labs  Lab 01/30/19 1125 02/04/19 0342 02/05/19 0424  NA  --  138 141  K  --  4.7 3.8  CL  --  104 100  CO2  --  23 28  GLUCOSE  --  170* 141*  BUN  --  34* 35*  CREATININE  --  1.76* 1.60*  CALCIUM  --  8.1* 8.4*  MG 2.0  --   --    Lipid Panel:     Component Value Date/Time   CHOL 123 01/29/2019 0222   TRIG 244 (H) 01/29/2019 0222   HDL <10 (L) 01/29/2019 0222   CHOLHDL NOT CALCULATED 01/29/2019 0222   VLDL 49 (H) 01/29/2019 0222   LDLCALC NOT CALCULATED 01/29/2019 0222   HgbA1c:  Lab Results  Component Value Date   HGBA1C 8.8 (H) 01/29/2019   Urine Drug Screen: No results found for: LABOPIA, COCAINSCRNUR, LABBENZ, AMPHETMU, THCU, LABBARB  Alcohol Level     Component Value Date/Time   ETH <10 01/28/2019 2120    IMAGING  DG CHEST PORT 1 VIEW  Result Date: 02/04/2019 CLINICAL DATA:  Increased shortness of breath EXAM: PORTABLE CHEST 1 VIEW COMPARISON:  January 29, 2019 FINDINGS: The heart size and mediastinal contours are unchanged. There is patchy/fluffy airspace opacities seen predominantly within the perihilar region with increased interstitial markings throughout both lungs.  No pleural effusion is seen. No acute osseous abnormality. IMPRESSION: Interval development of patchy/fluffy perihilar airspace opacities. This could be due to asymmetric pulmonary edema and/or infectious etiology. Electronically Signed   By: Jonna Clark M.D.   On: 02/04/2019 00:35   Cerebral Angiogram S/P bilateral common carotid and RT Vert arteriogram followed by complete revascularization of occluded extracranial and intracranial RT ICA with x 2 passes with 40 mm solitaireX retriever device and penumbra aspiration achieving a TICI 3  Rt MCA revascularization.   PHYSICAL EXAM  Temp:  [97.6 F (36.4 C)-97.8 F (36.6 C)] 97.7 F (36.5 C) (12/24 0708) Pulse Rate:  [70-77] 71 (12/24 0830) Resp:  [20-29] 22 (12/24 0830) BP: (91-109)/(51-62) 105/62 (12/24 0830) SpO2:  [91 %-97 %] 96 % (12/24 0830)  General - frail elderly caucasian male in moderate  distress.  Ophthalmologic - fundi not visualized due to noncooperation.  Cardiovascular - Regular rhythm and rate, not in afib.  Neuro - awake alert, fluent language, however he is agitated and confused. He is oriented to self only at this time. Able to follow simple commands. No gaze  deviation, no hemianopia. However, more right side preference and no further left sided neglect. Tracking on both sides, PERRL. Mild left facial droop. Tongue midline. Left UE 4/5. LLE proximal 4+/5, distal 5/5 with toe DF and PF. Sensation symmtrical subjectively. FTN intact on the right but dysmetria on the left. Gait not tested.  ASSESSMENT/PLAN Adam Leon is a 85 y.o. male with history of HLD, HTN, DM presenting after a fall with L sided weakness face, arm and leg.   Stroke: bilateral anterior and posterior infarcts with L ICA occlusion s/p IR w TICI3 revascularization, likely embolic d/t new AF w/ RVR and LV thrombus in the setting of undiagnosed recent MI  Code Stroke CT head No acute abnormality. pontiue hypodensity. ASPECTS 10.     CTA  head & neck R ICA occlusion. Decreased R MCA flow w/ good cross-filling.  CT perfusion 13 mL core infarct R watershed w/ delayed perfusion R MCA d/t R ICA occlusion   Cerebral Angio occlusion intra and extracranial ICA with TICI3 reperfusion    MRI  Scattered small cerebrum and cerebellar bilateral infarcts (greatest R frontoparietal)  MRA  Limited unremarkable  Carotid Doppler  R 1-39% stenosis    2D Echo severe LV dysfunction < 20% w/ possible stress cardiomyopathy w/ apical ballooning and LV thrombus  LDL NTC due to TG 244 and low HDL < 10  Direct LDL - 58.5  HgbA1c 8.8  IV heparin for VTE prophylaxis  aspirin 81 mg daily prior to admission, now on aspirin 325 mg daily and heparin IV. warfarin started 01/31/19  Therapy recommendations:  CIR vs SNF or PC pending GOC discussion w/family  Disposition:  pending   PAF w/ RVR, new diagnosis  Home anticoagulation:  none   CHA2DS2-VASc Score = at least 6, ?2 oral anticoagulation recommended  Age in Years:  ?86   +2    Sex:  Male   0    Hypertension History:  yes   +1     Diabetes Mellitus:  yes   +1  Congestive Heart Failure History:  0  Vascular Disease History:  yes   +1     Stroke/TIA/Thromboembolism History:  yes   +2 . Placed again on IV heparin since INR down & not remaining at goal . Started on amiodarone w/ bolus due to RVR -> Per Dr Claiborne Billings. Now PO Amio on 02/01/19 . Converted to sinus rhythm   Abnormal EKG w/ ST elevation LV Systolic Dysunction, LV thrombus  Felt likely reflective of ICA occlusion w/ demand ischemia  EKG w/ concern for anterior MI  Trop 15519->15715->11893   2D Echo severe LV dysfunction < 20% w/ possible stress cardiomyopathy w/ apical ballooning and LV thrombus  No BB d/t low BP  BNP 4321.1->2,302  Cardiology Dr. Tamala Julian on board   Probable anterolateral MI prior to admission per cardiology  Hypotension Hx Hypertension  Home meds:  atenolol 25 daily, lisinopril 5  Low BP felt to  be related to AF in setting of severe LV dysfunction  BP goal 120-140 by artery line  PRN levophed for BP goal  Stopped IVF d/t severe LV dysfunction . Long-term BP goal normotensive  Hyperlipidemia  Home meds:  pravachol 40  LDL NTC (TG 244 and HDL < 10), goal < 70  Direct LDL - 58.5  Statin held d/t elevated LFTs - improving - (see below)  Continue statin once LFTs improved  Diabetes type II Uncontrolled  Home meds:  Metformin 500 bid  HgbA1c  8.8, goal < 7.0  CBGs  SSI  Close PCP follow up  UGIB, resolved - ? Stress ulcer  Coffee ground color emesis - resolved   Off NG suction  PPI IV Q12h  On diet now  Other Stroke Risk Factors  Advanced age  MI, LV thrombus and low EF  Other Active Problems  Mild cognitive decline at baseline suspect this is now aggravated by his stroke, heart failure and sundowning.- agitation early AM 01/31/19. Improved today  Physical deconditioning over past 2 weeks going from independent walking to using a walker. Had ER eval 2 wks ago for fatigue and decreased appetite.   AKI 2.32->2.2->1.94->1.91->1.70->1.80->1.78->1.4  Leukocytosis 15.0-17.4-18.6-15.5->15.1->12.1->10.4  Acute blood loss anemia 12.9-13.6-11.8-11.3-10.6->10.6->10.5->10.4  Mild transaminitis ; AST 67-> 68->114->44 ALT 49->50 ALP 133->146->105->64 - improving  CCM signed off 01/31/19  Anticoagulation - Heparin / Coumadin per pharmacy for afib and thrombus - INR - 1.4->2.2->2.8 ->1.9->1.8  Moderate, fixed thrombus on the apical wall of the left ventricle by echo 01/29/19  Potassium - 3.5->3.6->4.7  Hospital day # 8   Patient has had intermittent agitation confusion and delirium which I suspect is aggravation of his baseline mild cognitive impairment and is multifactorial due to his stroke, CHF and sundowning. I had telephonic medical peer to peer with [facility] insurance medical director who denied inpatient rehab due to incomplete OT/ST documentation and  now I also feel patient is not appropriate for that.  I had a long discussion with patient's wife and daughter at the bedside and answered questions about his prognosis, rehab needs palliative care.  I did agree with DNR and comfort care measures and interested in moving the [facility] to [facility] when bed becomes available.  Discussed with social worker and rehab coordinator greater than 50% time during this 25-minute visit was spent on counseling and coordination of care and discussion with care team Delia HeadyPramod Ellese Julius, MD 02/05/2019 2:37 PM  To contact Stroke Continuity provider, please refer to [email or web address]. After hours, contact General Neurology

## 2019-02-05 NOTE — Progress Notes (Signed)
[facility]   Received request from [place] for family interest in Adam Leon. Chart reviewed and spoke with daughter by phone. She is aware [facility] is not able to offer a room today and a hospital liaison will follow up with her over the weekend or sooner if a room becomes available. [facility] eligibility confirmed by hospice physician.   Please do not hesitate to call with questions.   [facility] Liaisons are listed daily on AMION under Hospice and [place].  Erling Conte, [place]

## 2019-02-05 NOTE — Progress Notes (Signed)
ANTICOAGULATION CONSULT NOTE  Pharmacy Consult for Heparin/Coumadin Indication: ACS s/p carotid embolectomy and LV thrombus   Patient Measurements: Height: 6' (182.9 cm) Weight: 195 lb (88.5 kg) IBW/kg (Calculated) : 77.6 Heparin weight: 88.5kg  Vital Signs: Temp: 97.7 F (36.5 C) (12/24 0708) Temp Source: Oral (12/24 0708) BP: 105/62 (12/24 0830) Pulse Rate: 71 (12/24 0830)  Labs: Recent Labs    02/03/19 0621 02/04/19 0342 02/04/19 2000 02/04/19 2127 02/05/19 0424 02/05/19 0757  HGB 10.5* 12.0*  --   --  12.2*  --   HCT 32.9* 36.7*  --   --  38.7*  --   PLT 410* 474*  --   --  444*  --   LABPROT 22.0* 20.5*  --   --  20.6*  --   INR 1.9* 1.8*  --   --  1.8*  --   HEPARINUNFRC  --   --  2.04* 0.24*  --  0.82*  CREATININE 1.48* 1.76*  --   --  1.60*  --     Estimated Creatinine Clearance: 36.4 mL/min (A) (by C-G formula based on SCr of 1.6 mg/dL (H)).    Assessment: 85 y.o. male with EKG changes, elevated cardiac markers and LV thrombus s/p CVA and carotid thrombectomy started on warfarin.  -INR down to 1.8 ((trend down; went up to 2.8 48hrs after one dose of 5mg ) -Heparin level elevated this AM at 0.82  Goal of Therapy:  Heparin level 0.3-0.5 units/mL Monitor platelets by anticoagulation protocol: Yes  INR 2 to 3   Plan:   Decrease heparin to 1200 units / hr Recheck heparin level in 8hr Hopefully, will be able to stop heparin 12/25  Warfarin 3 mg po x 1 dose tonight   Thank you Anette Guarneri, PharmD Please check AMION for all [facility] numbers 02/05/2019

## 2019-02-06 ENCOUNTER — Inpatient Hospital Stay (HOSPITAL_COMMUNITY): Payer: Medicare HMO

## 2019-02-06 ENCOUNTER — Encounter (HOSPITAL_COMMUNITY): Payer: Self-pay | Admitting: Student in an Organized Health Care Education/Training Program

## 2019-02-06 LAB — PROTIME-INR
INR: 2.3 — ABNORMAL HIGH (ref 0.8–1.2)
Prothrombin Time: 25.6 seconds — ABNORMAL HIGH (ref 11.4–15.2)

## 2019-02-06 LAB — GLUCOSE, CAPILLARY
Glucose-Capillary: 184 mg/dL — ABNORMAL HIGH (ref 70–99)
Glucose-Capillary: 99 mg/dL (ref 70–99)

## 2019-02-06 LAB — HEPARIN LEVEL (UNFRACTIONATED): Heparin Unfractionated: 0.66 IU/mL (ref 0.30–0.70)

## 2019-02-06 MED ORDER — FUROSEMIDE 40 MG PO TABS
40.0000 mg | ORAL_TABLET | Freq: Every day | ORAL | Status: DC
  Administered 2019-02-07: 40 mg via ORAL
  Filled 2019-02-06: qty 1

## 2019-02-06 MED ORDER — MORPHINE SULFATE (CONCENTRATE) 10 MG/0.5ML PO SOLN: 5.0000 mg | ORAL | Status: DC | PRN

## 2019-02-06 MED ORDER — LORAZEPAM 1 MG PO TABS
1.0000 mg | ORAL_TABLET | ORAL | Status: DC | PRN
  Administered 2019-02-08: 1 mg via ORAL
  Filled 2019-02-06: qty 1

## 2019-02-06 MED ORDER — LORAZEPAM 2 MG/ML PO CONC: 1.0000 mg | ORAL | Status: DC | PRN

## 2019-02-06 MED ORDER — ALBUTEROL SULFATE (2.5 MG/3ML) 0.083% IN NEBU
2.5000 mg | INHALATION_SOLUTION | RESPIRATORY_TRACT | Status: DC | PRN
  Administered 2019-02-06 – 2019-02-11 (×2): 2.5 mg via RESPIRATORY_TRACT
  Filled 2019-02-06 (×2): qty 3

## 2019-02-06 MED ORDER — FUROSEMIDE 10 MG/ML IJ SOLN
40.0000 mg | Freq: Once | INTRAMUSCULAR | Status: AC
  Administered 2019-02-06: 40 mg via INTRAVENOUS
  Filled 2019-02-06: qty 4

## 2019-02-06 MED ORDER — WARFARIN SODIUM 2 MG PO TABS: 2.0000 mg | ORAL_TABLET | Freq: Once | ORAL | Status: DC

## 2019-02-06 MED ORDER — LORAZEPAM 2 MG/ML IJ SOLN: 1.0000 mg | INTRAMUSCULAR | Status: DC | PRN

## 2019-02-06 NOTE — Plan of Care (Signed)
  Problem: Education: Goal: Knowledge of General Education information will improve Description: Including pain rating scale, medication(s)/side effects and non-pharmacologic comfort measures Outcome: Progressing   Problem: Health Behavior/Discharge Planning: Goal: Ability to manage health-related needs will improve Outcome: Progressing   Problem: Clinical Measurements: Goal: Ability to maintain clinical measurements within normal limits will improve Outcome: Progressing Goal: Will remain free from infection Outcome: Progressing Goal: Diagnostic test results will improve Outcome: Progressing Goal: Respiratory complications will improve Outcome: Progressing Goal: Cardiovascular complication will be avoided Outcome: Progressing   Problem: Activity: Goal: Risk for activity intolerance will decrease Outcome: Progressing   Problem: Nutrition: Goal: Adequate nutrition will be maintained Outcome: Progressing   Problem: Coping: Goal: Level of anxiety will decrease Outcome: Progressing   Problem: Elimination: Goal: Will not experience complications related to bowel motility Outcome: Progressing Goal: Will not experience complications related to urinary retention Outcome: Progressing   Problem: Pain Managment: Goal: General experience of comfort will improve Outcome: Progressing   Problem: Safety: Goal: Ability to remain free from injury will improve Outcome: Progressing   Problem: Skin Integrity: Goal: Risk for impaired skin integrity will decrease Outcome: Progressing   Problem: Education: Goal: Knowledge of disease or condition will improve Outcome: Progressing Goal: Knowledge of secondary prevention will improve Outcome: Progressing Goal: Knowledge of patient specific risk factors addressed and post discharge goals established will improve Outcome: Progressing Goal: Individualized Educational Video(s) Outcome: Progressing   Problem: Coping: Goal: Will verbalize  positive feelings about self Outcome: Progressing Goal: Will identify appropriate support needs Outcome: Progressing   Problem: Health Behavior/Discharge Planning: Goal: Ability to manage health-related needs will improve Outcome: Progressing   Problem: Self-Care: Goal: Ability to participate in self-care as condition permits will improve Outcome: Progressing Goal: Verbalization of feelings and concerns over difficulty with self-care will improve Outcome: Progressing Goal: Ability to communicate needs accurately will improve Outcome: Progressing   Problem: Nutrition: Goal: Risk of aspiration will decrease Outcome: Progressing Goal: Dietary intake will improve Outcome: Progressing   Problem: Ischemic Stroke/TIA Tissue Perfusion: Goal: Complications of ischemic stroke/TIA will be minimized Outcome: Progressing   Problem: Cardiac: Goal: Ability to achieve and maintain adequate cardiopulmonary perfusion will improve Outcome: Progressing   Problem: Health Behavior/Discharge Planning: Goal: Ability to safely manage health-related needs after discharge will improve Outcome: Progressing   Kenlyn Lose, BSN, RN 

## 2019-02-06 NOTE — Plan of Care (Signed)
  Problem: Education: Goal: Knowledge of General Education information will improve Description: Including pain rating scale, medication(s)/side effects and non-pharmacologic comfort measures Outcome: Progressing   Problem: Health Behavior/Discharge Planning: Goal: Ability to manage health-related needs will improve Outcome: Progressing   Problem: Clinical Measurements: Goal: Ability to maintain clinical measurements within normal limits will improve Outcome: Progressing Goal: Will remain free from infection Outcome: Progressing Goal: Diagnostic test results will improve Outcome: Progressing Goal: Respiratory complications will improve Outcome: Progressing Goal: Cardiovascular complication will be avoided Outcome: Progressing   Problem: Activity: Goal: Risk for activity intolerance will decrease Outcome: Progressing   Problem: Nutrition: Goal: Adequate nutrition will be maintained Outcome: Progressing   Problem: Coping: Goal: Level of anxiety will decrease Outcome: Progressing   Problem: Elimination: Goal: Will not experience complications related to bowel motility Outcome: Progressing Goal: Will not experience complications related to urinary retention Outcome: Progressing   Problem: Pain Managment: Goal: General experience of comfort will improve Outcome: Progressing   Problem: Safety: Goal: Ability to remain free from injury will improve Outcome: Progressing   Problem: Skin Integrity: Goal: Risk for impaired skin integrity will decrease Outcome: Progressing   Problem: Education: Goal: Knowledge of disease or condition will improve Outcome: Progressing Goal: Knowledge of secondary prevention will improve Outcome: Progressing Goal: Knowledge of patient specific risk factors addressed and post discharge goals established will improve Outcome: Progressing Goal: Individualized Educational Video(s) Outcome: Progressing   Problem: Coping: Goal: Will verbalize  positive feelings about self Outcome: Progressing Goal: Will identify appropriate support needs Outcome: Progressing   Problem: Health Behavior/Discharge Planning: Goal: Ability to manage health-related needs will improve Outcome: Progressing   Problem: Self-Care: Goal: Ability to participate in self-care as condition permits will improve Outcome: Progressing Goal: Verbalization of feelings and concerns over difficulty with self-care will improve Outcome: Progressing Goal: Ability to communicate needs accurately will improve Outcome: Progressing   Problem: Nutrition: Goal: Risk of aspiration will decrease Outcome: Progressing Goal: Dietary intake will improve Outcome: Progressing   Problem: Ischemic Stroke/TIA Tissue Perfusion: Goal: Complications of ischemic stroke/TIA will be minimized Outcome: Progressing   Problem: Cardiac: Goal: Ability to achieve and maintain adequate cardiopulmonary perfusion will improve Outcome: Progressing   Problem: Health Behavior/Discharge Planning: Goal: Ability to safely manage health-related needs after discharge will improve Outcome: Progressing   Ival Bible, BSN, RN

## 2019-02-06 NOTE — Progress Notes (Signed)
[place] for Heparin/Coumadin Indication: ACS, S/P carotid embolectomy, and LV thrombus   Patient Measurements: Height: 6' (182.9 cm) Weight: 195 lb (88.5 kg) IBW/kg (Calculated) : 77.6 Heparin weight: 88.5 kg  Vital Signs: Temp: 97.5 F (36.4 C) (12/25 0311) Temp Source: Axillary (12/25 0311) BP: 112/82 (12/25 0038) Pulse Rate: 93 (12/25 0423)  Labs: Recent Labs    02/04/19 0342 02/05/19 0424 02/05/19 0757 02/05/19 2034 02/06/19 0517  HGB 12.0* 12.2*  --   --   --   HCT 36.7* 38.7*  --   --   --   PLT 474* 444*  --   --   --   LABPROT 20.5* 20.6*  --   --  25.6*  INR 1.8* 1.8*  --   --  2.3*  HEPARINUNFRC  --   --  0.82* 0.72* 0.66  CREATININE 1.76* 1.60*  --   --   --     Estimated Creatinine Clearance: 36.4 mL/min (A) (by C-G formula based on SCr of 1.6 mg/dL (H)).    Assessment: 85 y.o. male with EKG changes, elevated cardiac markers and LV thrombus, S/P CVA and carotid thrombectomy, started on warfarin.   Heparin level supratherapeutic with lower goals s/p rate decrease, INR now therapeutic at 2.3.  CBC stable.    Goal of Therapy:  Heparin level 0.3-0.5 units/mL INR 2-3 Monitor platelets by anticoagulation protocol: Yes    Plan:   Decrease heparin gtt to 1000 units/hr Give warfarin 2mg  PO x 1 today 8 hour heparin level Daily INR, heparin level, CBC, s/s bleeding F/u stability of INR to d/c heparin gtt  Bertis Ruddy, PharmD Clinical Pharmacist Please check AMION for all [place] numbers 02/06/2019 7:44 AM

## 2019-02-06 NOTE — Progress Notes (Addendum)
  Called by RN for drop in O2 sats to upper 80s while on 2L oxygen, in conjunction with wheezing. Sats improved to 92% on 2 L O2. As noted on 12/23 when he had SOB with airspace opacities on CXR, his EF on most recent echo was < 20% and volume overload is again a consideration.   Will administer 40 mg IV Lasix, continue to monitor I/O with foley catheter and will obtain repeat CXR. RN has been asked to call back with an update in 2 hours.   Discussed with Cardiology on call. Recommendation is for Stroke Team to call Cardiology back to reevaluate (they had consulted on the 17th) in the AM for recommendations regarding scheduled Lasix dosing for the patient's CHF versus stress cardiomyopathy.   Electronically signed: Dr. Kerney Elbe

## 2019-02-06 NOTE — Progress Notes (Signed)
STROKE TEAM PROGRESS NOTE   INTERVAL HISTORY Patient developed respiratory distress last night as well and was treated with Lasix and increased oxygen.  Is resting peacefully this morning.  Can be barely aroused moves all 4 extremities.  I spoke to his daughter over the phone and she agrees with full comfort care measures only hence we will discontinue all medications but just keep him on as needed morphine, Lasix and oxygen.  Await bed and hospice nursing home when available  Vitals:   02/06/19 0423 02/06/19 0539 02/06/19 0834 02/06/19 1119  BP:   95/64   Pulse: 93  80   Resp: (!) _0 Temp:   97.9 F (36.6 C) 98.2 F (36.8 C)  TempSrc:   Axillary Axillary  SpO2: 98%  100%   Weight:      Height:        PHYSICAL EXAM     General - frail elderly caucasian male in mild distress.  Ophthalmologic - fundi not visualized due to noncooperation.  Cardiovascular - Regular rhythm and rate, not in afib.  Neuro -stuporous.  Can barely be aroused.  Not following any commands.  No gaze deviation, no hemianopia. However, more right side preference and no further left sided neglect. Tracking on both sides, PERRL. Mild left facial droop. Tongue midline. Left UE 4/5. LLE proximal 4+/5, distal 5/5 with toe DF and PF. Sensation symmtrical subjectively. FTN intact on the right but dysmetria on the left. Gait not tested.  ASSESSMENT/PLAN [name] is a 85 y.o. male with history of HLD, HTN, DM presenting after a fall with L sided weakness face, arm and leg.   Stroke: bilateral anterior and posterior infarcts with L ICA occlusion s/p IR w TICI3 revascularization, likely embolic d/t new AF w/ RVR and LV thrombus in the setting of undiagnosed recent MI  Code Stroke CT head No acute abnormality. pontiue hypodensity. ASPECTS 10.     CTA head & neck R ICA occlusion. Decreased R MCA flow w/ good cross-filling.  CT perfusion 13 mL core infarct R watershed w/ delayed perfusion R MCA d/t R  ICA occlusion   Cerebral Angio occlusion intra and extracranial ICA with TICI3 reperfusion    MRI  Scattered small cerebrum and cerebellar bilateral infarcts (greatest R frontoparietal)  MRA  Limited unremarkable  Carotid Doppler  R 1-39% stenosis    2D Echo severe LV dysfunction < 20% w/ possible stress cardiomyopathy w/ apical ballooning and LV thrombus  LDL NTC due to TG 244 and low HDL < 10  Direct LDL - 58.5  HgbA1c 8.8  warfarin for VTE prophylaxis -> D/c 12.25  aspirin 81 mg daily prior to admission, now on aspirin 325 mg daily and heparin IV. warfarin started 01/31/19 ->d/c 12.25   Therapy recommendations:  CIR vs SNF or PC pending GOC discussion w/family ->[facility]  Disposition:  pending   Unable to qualify for CIR. Dr. Leonie Leon and SW met with pt/family yesterday. After discussion, family would like transition to [facility]. CM working on bed, none available today.  Adjust orders to be consistent with comfort care  PAF w/ RVR, new diagnosis  Home anticoagulation:  none   CHA2DS2-VASc Score = at least 6, ?2 oral anticoagulation recommended  Age in Years:  ?49   +2    Sex:  Male   0    Hypertension History:  yes   +1     Diabetes Mellitus:  yes   +1  Congestive Heart Failure History:  0  Vascular Disease History:  yes   +1     Stroke/TIA/Thromboembolism History:  yes   +2 . Placed again on IV heparin since INR down & not remaining at goal . Started on amiodarone w/ bolus due to RVR ->  . Now PO Amio and Converted to sinus rhythm   Abnormal EKG w/ ST elevation LV Systolic Dysunction, LV thrombus  Felt likely reflective of ICA occlusion w/ demand ischemia  EKG w/ concern for anterior MI  Trop 15519->15715->11893   2D Echo severe LV dysfunction < 20% w/ possible stress cardiomyopathy w/ apical ballooning and LV thrombus  No BB d/t low BP  BNP 4321.1->2,302->2613.1   Probable anterolateral MI prior to admission per cardiology  Planned medical  management  Cardiology consulted, now signed off  Acute Respiratory failure   CCM signed off 01/31/19  Recurrent episode of respiratory distress  sats to 80s on 2L w/ wheezing 12/25 during the night  Likely d/t volume overload  Treated w/ lasix  Repeat CXR 12/25 w/ some edema, ? PNA  Cardiology recommends maintenance lasix     Hypotension Hx Hypertension  Home meds:  atenolol 25 daily, lisinopril 5  Low BP felt to be related to AF in setting of severe LV dysfunction  BP goal 120-140 by artery line  PRN levophed for BP goal  Stopped IVF d/t severe LV dysfunction  Hyperlipidemia  Home meds:  pravachol 40  LDL NTC (TG 244 and HDL < 10), goal < 70  Direct LDL - 58.5  Statin held d/t elevated LFTs - improving - (see below)  Diabetes type II Uncontrolled  Home meds:  Metformin 500 bid  HgbA1c 8.8, goal < 7.0  CBGs  SSI  UGIB, resolved - ? Stress ulcer  Coffee ground color emesis - resolved   Off NG suction  PPI IV Q12h  On diet now  Other Stroke Risk Factors  Advanced age  MI, LV thrombus and low EF  Other Active Problems  Mild cognitive decline at baseline suspect this is now aggravated by his stroke, heart failure and sundowning.- agitation early AM 01/31/19. Improved today  Physical deconditioning over past 2 weeks going from independent walking to using a walker. Had ER eval 2 wks ago for fatigue and decreased appetite.   AKI    Leukocytosis   Acute blood loss anemia   Mild transaminitis ; AST 67-> 68->114->44->33 ALT 49->50 ALP 133->146->105->64->49 - improving  Anticoagulation - Heparin / Coumadin per pharmacy for afib and thrombus - INR - 1.4->2.2->2.8 ->1.9->1.8->2.3 -> will d/c d/t comfort care  Moderate, fixed thrombus on the apical wall of the left ventricle by echo 01/29/19  Hypokalemia, resolved  Hospital day # 9 Patient's condition has declined in the last few days due to exacerbations of his CHF and family has agreed  to full comfort care measures and so we will discontinue all medications except his as needed Lasix and morphine and supplemental oxygen to keep him comfortable.  Await bed and [facility] nursing home if he survives the weekend.  Discussed with daughter and answered questions.  Greater than 50% time during this 25-minute visit was spent on counseling and coordination of care and answering questions and discussion with care team  Antony Contras, MD 02/06/2019 11:22 AM  To contact Stroke Continuity provider, please refer to [email or web address] After hours, contact General Neurology

## 2019-02-06 NOTE — Progress Notes (Addendum)
Progress Note  Patient Name: Adam Leon Date of Encounter: 02/06/2019  Primary Cardiologist: Armanda Magic, MD   Subjective   On nasal cannula O2 with sats of 100%.  In mitten restraints.  Somewhat confused  Inpatient Medications    Scheduled Meds: . amiodarone  200 mg Oral BID  . aspirin  81 mg Oral Daily  . atorvastatin  40 mg Oral q1800  . carvedilol  3.125 mg Oral BID WC  . chlorhexidine  15 mL Mouth Rinse BID  . Chlorhexidine Gluconate Cloth  6 each Topical Daily  . feeding supplement (ENSURE ENLIVE)  237 mL Oral BID BM  . insulin aspart  0-15 Units Subcutaneous TID WC  . insulin aspart  4 Units Subcutaneous TID WC  . ipratropium-albuterol  3 mL Nebulization Once  . mouth rinse  15 mL Mouth Rinse q12n4p  . QUEtiapine  12.5 mg Oral QHS  . sodium bicarbonate  650 mg Oral TID  . sodium chloride flush  10-40 mL Intracatheter Q12H  . warfarin  2 mg Oral ONCE-1800  . Warfarin - Pharmacist Dosing Inpatient   Does not apply q1800   Continuous Infusions: . heparin 1,000 Units/hr (02/06/19 0612)   PRN Meds: acetaminophen **OR** acetaminophen (TYLENOL) oral liquid 160 mg/5 mL **OR** acetaminophen, albuterol, iohexol, ondansetron (ZOFRAN) IV, polyethylene glycol, senna-docusate, sodium chloride flush   Vital Signs    Vitals:   02/06/19 0321 02/06/19 0325 02/06/19 0423 02/06/19 0539  BP:      Pulse:   93   Resp:   (!) 30 17  Temp:      TempSrc:      SpO2: (!) 81% 100% 98%   Weight:      Height:        Intake/Output Summary (Last 24 hours) at 02/06/2019 0801 Last data filed at 02/06/2019 0324 Gross per 24 hour  Intake -  Output 1090 ml  Net -1090 ml   Last 3 Weights 01/28/2019  Weight (lbs) 195 lb  Weight (kg) 88.451 kg      Telemetry    SR - Personally Reviewed  ECG    No new tracing this morning.  Physical Exam  Confused frail older WM GEN: No acute distress.   Neck: No JVD Cardiac: RRR, no murmurs, rubs, or gallops.  Respiratory: Clear to  auscultation bilaterally. GI: Soft, nontender, non-distended  MS: No edema; No deformity. Mitten restraints.  Neuro:  Nonfocal   Labs    High Sensitivity Troponin:   Recent Labs  Lab 01/28/19 2120 01/28/19 2313 01/29/19 1107  TROPONINIHS 15,519* 15,715* 11,893*      Chemistry Recent Labs  Lab 01/31/19 0354 02/01/19 0547 02/02/19 0502 02/03/19 0621 02/04/19 0342 02/05/19 0424  NA 137 136 137 138 138 141  K 3.6 3.5 3.6 3.2* 4.7 3.8  CL 106 106 104 105 104 100  CO2 16* 17* 20* 20* 23 28  GLUCOSE 151* 131* 142* 83 170* 141*  BUN 50* 44* 36* 32* 34* 35*  CREATININE 1.80* 1.78* 1.57* 1.48* 1.76* 1.60*  CALCIUM 8.0* 7.9* 7.7* 7.7* 8.1* 8.4*  PROT 5.4* 5.2* 5.0*  --   --   --   ALBUMIN 2.0* 1.9* 1.9*  --   --   --   AST 114* 44* 33  --   --   --   ALT 105* 64* 49*  --   --   --   ALKPHOS 118 111 103  --   --   --  BILITOT 0.9 0.7 0.8  --   --   --   GFRNONAA 33* 34* 39* 42* 34* 38*  GFRAA 39* 39* 46* 49* 40* 45*  ANIONGAP 15 13 13 13 11 13      Hematology Recent Labs  Lab 02/03/19 0621 02/04/19 0342 02/05/19 0424  WBC 10.4 20.8* 11.9*  RBC 3.77* 4.27 4.47  HGB 10.5* 12.0* 12.2*  HCT 32.9* 36.7* 38.7*  MCV 87.3 85.9 86.6  MCH 27.9 28.1 27.3  MCHC 31.9 32.7 31.5  RDW 14.2 14.6 14.8  PLT 410* 474* 444*    BNP Recent Labs  Lab 02/01/19 0547 02/05/19 0424  BNP 2,302.2* 2,613.1*     DDimer No results for input(s): DDIMER in the last 168 hours.   Radiology    DG CHEST PORT 1 VIEW  Result Date: 02/06/2019 CLINICAL DATA:  Shortness of breath, wheezing EXAM: PORTABLE CHEST 1 VIEW COMPARISON:  Radiograph 02/04/2019 FINDINGS: Similar appearance of the bilateral interstitial and airspace disease throughout both lungs. No visible pneumothorax or effusion. Increasing septal thickening and fissural thickening. Cardiomediastinal contours are stable. The osseous structures appear diffusely demineralized which may limit detection of small or nondisplaced fractures. No  acute osseous or soft tissue abnormality. Support devices and cardiac monitoring leads overlie the chest. IMPRESSION: Similar appearance of the bilateral interstitial and airspace disease throughout both lungs, which could reflect edema or multifocal pneumonia. Favor at least some component of edema given the increasing fissural thickening Electronically Signed   By: Lovena Le M.D.   On: 02/06/2019 02:00    Cardiac Studies   CT head 12/16 > no acute infarct. CTA head / neck 12/16 >25ml core infarct on right. Occlusion of right ICA.  MRI/MRAbrain 12/17  IMPRESSION: 1. Severely motion degraded examination. 2. Scattered small acute to early subacute infarcts in the cerebrum and cerebellum bilaterally, greatest in the right frontoparietal region. 3. Limited head MRA due to motion with patency of the large intracranial arteries as above.  Echo 12/17 1. Left ventricular ejection fraction, by visual estimation, is <20%. The left ventricle has severely decreased function. Left ventricular septal wall thickness was mildly increased. Mildly increased left ventricular posterior wall thickness. 2. There is apical ballooning with akinesis of the entire apical wall. There is akinesis of the mid infero and anteroseptum. There is akinesis of the mid and apical inferior, anterior and lateral walls and akineseis of the apical inferolateral wall. 3. Moderate, fixed thrombus on the apical wall of the left ventricle. 4. Definity contrast agent was given IV to delineate the left ventricular endocardial borders. 5. Global right ventricle has normal systolic function.The right ventricular size is normal. No increase in right ventricular wall thickness. 6. Left atrial size was normal. 7. Right atrial size was normal. 8. The mitral valve is normal in structure. Trivial mitral valve regurgitation. No evidence of mitral stenosis. 9. The tricuspid valve is normal in structure. Tricuspid valve  regurgitation is mild. 10. The aortic valve is tricuspid. Aortic valve regurgitation is not visualized. No evidence of aortic valve sclerosis or stenosis. 11. The pulmonic valve was normal in structure. Pulmonic valve regurgitation is not visualized. 12. Normal pulmonary artery systolic pressure. 13. The inferior vena cava is normal in size with greater than 50% respiratory variability, suggesting right atrial pressure of 3 mmHg. 14. Left ventricular diastolic parameters are consistent with Grade I diastolic dysfunction (impaired relaxation).  Patient Profile     85 y.o. male with hyperlipidemia, hypertension, diabetes, dementia who presented with who presented  with right-sided gaze preference and left-sided hemiplegia. Subsequently underwent thrombectomy of left carotid/internal carotid. Cardiac high-sensitivity troponins were elevated on admission and EKG demonstrated anterior ST elevation. ST elevation has persisted, enzymes are decreasing, thrombus in LV apex, felt to be the source of the patient's stroke on an embolic basis.  Patient was made DNR.  Assessment & Plan    1. Probable anterolateral ZO:XWRUEI:prior to admission resulting in marked wall motion abnormality with apical thrombus and EF less than 20%.HsT peaked at 15715.ECG with ST elevation anterolaterally. Repeat ECG showedQ waves V1 through V3 with persistent ST elevation anterolaterally. Current plan is for medical management. No reports of chest pain after working with PT. -- on ASA and statin  2. 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 Residual left hemiparesis. Warfarin started for anticoagulation, INR 1.8 today. With concern for hemorraghic conversion, IV heparin was stopped. Now with continued subtherapeutic INR will plan to resume. -- PharmD following to dose coumadin.  3. Ischemic cardiomyopathy with EF estimate on echo less than  20%:tolerating low dose of coreg, but no room to further titrate with soft blood pressures.  --Patient has had recurrent episodes of respiratory distress with infiltrates on his chest x-ray.  He is required as needed IV Lasix with I&O -1.3 L.  His serum creatinine is slightly improved down from 1.76-1.6.  Is reasonable to put him on a maintenance dose of oral Lasix.  Given the patient's soft blood pressure in the 100/60 range we will monitor closely with the addition of oral diuretics.  4. AKI: Creatinine 2.32 >>1.8 >>1.78>>1.57>>1.48>>1.76>>1.6 -- received lasix last evening  5. Atrial fibrillation: Converted to sinus rhythm on IV amiodarone which was started 01/30/2019.Maintaining sinus rhythm.Now converted to amiodarone 200mg  BID.  6. LFT elevation:now resolved.  7. Hyperlipidemia:Target LDL less than 70.  -- now on statin therapy  8. Acute respiratory distress: Acute respiratory distress again overnight.  He is on nasal cannula O2.  His white count has come down from 20,000-12,000.  He is satting at 100% on nasal cannula.  I suspect this is related to intermittent heart failure severe LV dysfunction.  He has been getting as needed IV Lasix and is -1.3 L.  Is reasonable to put him on furosemide 40 mg p.o. daily and follow his serum creatinine.  Given the patient's multiple medical comorbidities as well as recently being made DNR we will sign off.  Please call if we can be of further assistance  For questions or updates, please contact [facility] Please consult [email or web address] for contact info under        Signed, Nanetta BattyJonathan Fue Cervenka, MD  02/06/2019, 8:01 AM    Agree with note by Laverda PageLindsay Roberts NP-C  Events of last night noted.  He had respiratory distress requiring BiPAP in the nasal cannula.  He is now off supplemental oxygen.  His chest x-ray showed patchy infiltrates consistent with either pulmonary edema plus or minus infection.  His white count was 20,000.  He did  receive 40 mg of Lasix and had good urine output.  He denies shortness of breath now.  His serum creatinine did increase from 1.5-1.75.  His blood pressure is soft making additional medications difficult to initiate.  I spoke to his daughter who is going to review his will with his her brother with regards to goals of care and DNR.  His EF is less than 20%.  Currently in sinus rhythm.  His lungs are clear on  exam.  He is somewhat confused.  He may benefit from being on a low maintenance dose of oral furosemide (20 mg) to prevent further episodes of decompensation.  This would be temporized by his blood pressure and renal function.  No further therapeutic recommendations at this time.  Runell Gess, M.D., FACP, [facility], [facility] [address] [place], [place]  [address]  [phone] 02/06/2019 8:01 AM

## 2019-02-06 NOTE — Progress Notes (Signed)
Pt sat in upper 80's on 2L. Now on 5L sat 92% with ex wheezing. Dr. Cheral Marker updated.

## 2019-02-06 NOTE — TOC Progression Note (Signed)
Transition of Care [facility]) - Progression Note    Patient Details  Name: Adam Leon MRN: [id number] Date of Birth: 07/02/1930  [facility]) CM/SW [place], LCSW Phone Number: 02/06/2019, 12:20 PM  Clinical Narrative:    CSW reached out to [facility] but their secretary states they are not able to admit today.    Expected Discharge Plan: [place] Barriers to Discharge: Continued Medical Work up  Expected Discharge Plan and Services Expected Discharge Plan: [place]     Post Acute Care Choice: Hospice                                         Social Determinants of Health (SDOH) Interventions    Readmission Risk Interventions No flowsheet data found.

## 2019-02-07 DIAGNOSIS — I5041 Acute combined systolic (congestive) and diastolic (congestive) heart failure: Secondary | ICD-10-CM

## 2019-02-07 MED ORDER — WARFARIN - PHARMACIST DOSING INPATIENT: Freq: Every day | Status: DC

## 2019-02-07 MED ORDER — WARFARIN SODIUM 3 MG PO TABS
3.0000 mg | ORAL_TABLET | Freq: Once | ORAL | Status: AC
  Administered 2019-02-07: 3 mg via ORAL
  Filled 2019-02-07: qty 1

## 2019-02-07 MED ORDER — ASPIRIN EC 81 MG PO TBEC
81.0000 mg | DELAYED_RELEASE_TABLET | Freq: Every day | ORAL | Status: DC
  Administered 2019-02-07 – 2019-02-10 (×4): 81 mg via ORAL
  Filled 2019-02-07 (×4): qty 1

## 2019-02-07 MED ORDER — ATORVASTATIN CALCIUM 40 MG PO TABS
40.0000 mg | ORAL_TABLET | Freq: Every day | ORAL | Status: DC
  Administered 2019-02-07 – 2019-02-10 (×4): 40 mg via ORAL
  Filled 2019-02-07 (×4): qty 1

## 2019-02-07 MED ORDER — AMIODARONE HCL 200 MG PO TABS
200.0000 mg | ORAL_TABLET | Freq: Every day | ORAL | Status: DC
  Administered 2019-02-07 – 2019-02-10 (×4): 200 mg via ORAL
  Filled 2019-02-07 (×4): qty 1

## 2019-02-07 MED ORDER — CHLORHEXIDINE GLUCONATE CLOTH 2 % EX PADS
6.0000 | MEDICATED_PAD | Freq: Every day | CUTANEOUS | Status: DC
  Administered 2019-02-08 – 2019-02-10 (×3): 6 via TOPICAL

## 2019-02-07 NOTE — Discharge Summary (Addendum)
Patient ID: Adam Leon   MRN: [id number]      DOB: [birth year]  Date of Admission: 01/28/2019 Date of Discharge: 02/11/2019  Attending Physician:  Marvel Plan, MD, Stroke MD Consultant(s):   Roxanne Gates) Corliss Skains, MD (Interventional Neuroradiologist), cardiology Dr Mayford Knife, [facility] Patient's PCP:  Patient, No Pcp Per  DISCHARGE DIAGNOSIS:   Bilateral anterior and posterior infarcts with right ICA occlusion s/p IR w TICI3 revascularization, likely embolic d/t new atrial fibrillation w/ RVR and LV thrombus in the setting of undiagnosed recent MI.  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   AKI (acute kidney injury) (HCC)   Hypotension due to hypovolemia and cardiomyopathy   Probable anterolateral MI prior to admission - Troponin levels elevated   Acute CVA (cerebrovascular accident) (HCC)   Dyslipidemia   Essential hypertension -> hypotension   Syncope   New onset atrial fibrillation (HCC)   Coronary artery disease involving native coronary artery of native heart without angina pectoris   Uncontrolled type 2 diabetes mellitus with hyperglycemia (HCC)   Leukocytosis   Apical mural thrombus   Cardiomyopathy - EF < 20%   Mild transaminitis   Hypokalemia   Blood loss anemia   Cognitive decline   Malnutrition   Congestive heart failure   Warfarin anticoagulation  Allergies as of 02/11/2019   No Known Allergies     Medication List    STOP taking these medications   aspirin 81 MG tablet   atenolol 25 MG tablet Commonly known as: TENORMIN   lisinopril 5 MG tablet Commonly known as: ZESTRIL   metFORMIN 500 MG 24 hr tablet Commonly known as: GLUCOPHAGE-XR   pravastatin 40 MG tablet Commonly known as: PRAVACHOL     TAKE these medications   acetaminophen 325 MG tablet Commonly known as: TYLENOL Take 2 tablets (650 mg total) by mouth every 6 (six) hours as needed for mild pain  (or Fever >/= 101).   acetaminophen 650 MG suppository Commonly known as: TYLENOL Place 1 suppository (650 mg total) rectally every 6 (six) hours as needed for mild pain (or Fever >/= 101).   feeding supplement (ENSURE ENLIVE) Liqd Take 237 mLs by mouth 2 (two) times daily between meals.   glycopyrrolate 1 MG tablet Commonly known as: ROBINUL Take 1 tablet (1 mg total) by mouth every 4 (four) hours as needed (excessive secretions).   haloperidol 2 MG/ML solution Commonly known as: HALDOL Place 0.3 mLs (0.6 mg total) under the tongue every 4 (four) hours as needed for agitation (or delirium).   ondansetron 4 MG disintegrating tablet Commonly known as: ZOFRAN-ODT Take 1 tablet (4 mg total) by mouth every 6 (six) hours as needed for nausea.   oxyCODONE 5 MG immediate release tablet Commonly known as: Oxy IR/ROXICODONE Take 1 tablet (5 mg total) by mouth every 4 (four) hours as needed for severe pain.   polyvinyl alcohol 1.4 % ophthalmic solution Commonly known as: LIQUIFILM TEARS Place 1 drop into both eyes 4 (four) times daily as needed for dry eyes.      LABORATORY STUDIES CBC    Component Value Date/Time   WBC 22.0 (H) 02/10/2019 0304   RBC 4.45 02/10/2019 0304   HGB 12.2 (L) 02/10/2019 0304   HCT 38.5 (L) 02/10/2019 0304   PLT 398 02/10/2019 0304   MCV 86.5 02/10/2019 0304   MCH 27.4 02/10/2019 0304   MCHC 31.7 02/10/2019 0304  RDW 14.6 02/10/2019 0304   LYMPHSABS 1.3 01/29/2019 1355   MONOABS 2.2 (H) 01/29/2019 1355   EOSABS 0.0 01/29/2019 1355   BASOSABS 0.0 01/29/2019 1355   CMP    Component Value Date/Time   NA 138 02/10/2019 0304   K 3.9 02/10/2019 0304   CL 98 02/10/2019 0304   CO2 27 02/10/2019 0304   GLUCOSE 203 (H) 02/10/2019 0304   BUN 28 (H) 02/10/2019 0304   CREATININE 1.48 (H) 02/10/2019 0304   CALCIUM 8.2 (L) 02/10/2019 0304   PROT 5.7 (L) 02/08/2019 0352   ALBUMIN 2.4 (L) 02/08/2019 0352   AST 31 02/08/2019 0352   ALT 30 02/08/2019 0352    ALKPHOS 145 (H) 02/08/2019 0352   BILITOT 0.6 02/08/2019 0352   GFRNONAA 42 (L) 02/10/2019 0304   GFRAA 49 (L) 02/10/2019 0304   COAGS Lab Results  Component Value Date   INR 2.9 (H) 02/10/2019   INR 4.4 (HH) 02/09/2019   INR 3.2 (H) 02/08/2019   Lipid Panel    Component Value Date/Time   CHOL 123 01/29/2019 0222   TRIG 244 (H) 01/29/2019 0222   HDL <10 (L) 01/29/2019 0222   CHOLHDL NOT CALCULATED 01/29/2019 0222   VLDL 49 (H) 01/29/2019 0222   LDLCALC NOT CALCULATED 01/29/2019 0222   HgbA1C  Lab Results  Component Value Date   HGBA1C 8.8 (H) 01/29/2019   Urinalysis    Component Value Date/Time   COLORURINE AMBER (A) 02/09/2019 1838   APPEARANCEUR HAZY (A) 02/09/2019 1838   LABSPEC 1.017 02/09/2019 1838   PHURINE 5.0 02/09/2019 1838   GLUCOSEU 50 (A) 02/09/2019 1838   HGBUR LARGE (A) 02/09/2019 1838   BILIRUBINUR NEGATIVE 02/09/2019 1838   KETONESUR NEGATIVE 02/09/2019 1838   PROTEINUR 30 (A) 02/09/2019 1838   NITRITE NEGATIVE 02/09/2019 1838   LEUKOCYTESUR TRACE (A) 02/09/2019 1838   Alcohol Level    Component Value Date/Time   ETH <10 01/28/2019 2120     SIGNIFICANT DIAGNOSTIC STUDIES CT HEAD CODE STROKE WO CONTRAST 01/28/2019 1. No acute cortical infarct or hemorrhage.  2. Hypodensity in the pons could be artifact.  3. ASPECTS is 10   CT Code Stroke CTA Head W/WO contrast CT Code Stroke CTA Neck W/WO contrast CT Code Stroke Cerebral Perfusion with contrast 01/28/2019 1. 13 mL core infarct in the watershed territory on the right. Large area of delayed perfusion right MCA territory due to occlusion of the right internal carotid artery. Right anterior middle cerebral arteries are patent and supplied through the anterior communicating artery. Collateral circulation is most likely the reason for delayed perfusion in the right MCA territory.  2. No other significant intracranial stenosis.   MR MRA HEAD WO CONTRAST 01/29/2019 1. Severely motion  degraded examination.  2. Scattered small acute to early subacute infarcts in the cerebrum and cerebellum bilaterally, greatest in the right frontoparietal region.  3. Limited head MRA due to motion with patency of the large intracranial arteries as above.    IR CT Head Ltd 01/30/2019 Status post endovascular complete revascularization of the right internal carotid artery extra cranially and intracranially and partially the right middle cerebral artery proximally at its origin with 2 passes with the Solitaire X 4 x 40 mm retrieval device, and Penumbra aspiration achieving a TICI 3 revascularization of the right middle cerebral artery, and the right anterior cerebral artery distributions.   CT C-SPINE NO CHARGE 01/28/2019 Negative for cervical spine fracture.   DG CHEST PORT 1 VIEW 02/10/2019 Worsened  aeration secondary to mildly increased interstitial edema and primarily right-sided airspace disease. This could represent alveolar edema or concurrent infection. Probable layering small bilateral pleural effusions. 02/09/2019 1. Cardiomegaly with findings suspicious for pulmonary edema. An atypical infectious process is difficult to exclude. 2. Bilateral pleural effusions. 3. Retrocardiac opacity favored to represent atelectasis. 02/08/2019 Improved but persistent bilateral interstitial and airspace disease, right greater the left compatible with asymmetric edema or multifocal infection. Tiny bilateral pleural effusions. 02/06/2019 Similar appearance of the bilateral interstitial and airspace disease throughout both lungs, which could reflect edema or multifocal pneumonia. Favor at least some component of edema given the increasing fissural thickening  02/04/2019 Interval development of patchy/fluffy perihilar airspace opacities. This could be due to asymmetric pulmonary edema and/or infectious etiology.  01/29/2019 Interstitial pulmonary edema. 01/15/2019 No active disease.  DG Abd Portable  1V 01/29/2019 Tip of the enteric tube below the diaphragm in the stomach, the side-port in the region of the gastroesophageal junction. Recommend advancement of least 3 cm for optimal placement.   DG Swallowing Func-Speech Pathology 02/02/2019 Objective Swallowing Evaluation: Type of Study: MBS-Modified Barium Swallow Study  Patient Details Name: Willa Roughnthony Surratt MRN: [id number] Date of Birth: 28-Jan-1931 Today's Date: 02/02/2019 Time: SLP Start Time (ACUTE ONLY): 1220 -SLP Stop Time (ACUTE ONLY): 1245 SLP Time Calculation (min) (ACUTE ONLY): 25 min Past Medical History: Past Medical History: Diagnosis Date . Hypercholesteremia  . Syncope  . Syncope  . Ventricular hypertrophy  Past Surgical History: Past Surgical History: Procedure Laterality Date . IR ANGIO INTRA EXTRACRAN SEL COM CAROTID INNOMINATE UNI L MOD SED  01/29/2019 . IR ANGIO VERTEBRAL SEL SUBCLAVIAN INNOMINATE UNI R MOD SED  01/29/2019 . IR CT HEAD LTD  01/29/2019 . IR PERCUTANEOUS ART THROMBECTOMY/INFUSION INTRACRANIAL INC DIAG ANGIO  01/29/2019 HPI: Willa Roughnthony Valeriano is a 85 y.o. male who has a PMH including but not limited to syncope, HLD.  [facility] with left hemiplegia.  CTA showed R ICA occlusion.  He was subsequently taken to IR for arteriogram with complete revascularization of occluded extracranial and intracranial R ICA. CXR 12/17: interstitial edema.  CT 12/16: R MCA infarct in deep white matter  Subjective: Pt seen in radiology for MBS. Assessment / Plan / Recommendation CHL IP CLINICAL IMPRESSIONS 02/02/2019 Clinical Impression Pt seen in radiology for MBS to objectively assess swallow function and safety, based on clinical presentation at bedside. Pt was uprght in chair, awake and alert. He is hard of hearing. Pt was given trials of nectar thick liquid, thin liquid, puree, and solid textures. He was also given a barium tablet in water.  Orally, pt exhibited oral holding and piecemeal swallow, with premature spillage to the  vallecular sinus. Pharyngeal swallow is characterized by trigger of swallow reflex at the vallecular sinus across consistencies. Very trace subepiglottic penetration was noted during the swallow of thin and nectar thick liquids, however, penetrate cleared completely and spontaeously and was not aspirated. Mild lateral channel and vallecular residue was noted after the swallow on most consistencies, which cleared with second/dry swallow. Barium tablet cleared the oral cavity without delay, but was noted to pause in the vallecular sinus before clearing with second swallow. Esophageal sweep revealed it to be clear. Recommend continuing with Dys 2 solids and thin liquids, meds either whole or crushed in puree (based on pt mentation, which is likely to vary due to dx dementia). Recommend assistance with feeding as needed, again based on pt mentation. Safe swallow precautions were reviewed with RN, and  sent in written form with transport to be placed at [facility]. SLP will follow up to assess diet tolerance and continue education.  SLP Visit Diagnosis Dysphagia, oropharyngeal phase (R13.12)     Impact on safety and function Mild aspiration risk;Moderate aspiration risk   CHL IP TREATMENT RECOMMENDATION 02/02/2019 Treatment Recommendations Therapy as outlined in treatment plan below   Prognosis 02/02/2019 Prognosis for Safe Diet Advancement Fair Barriers to Reach Goals Cognitive deficits   CHL IP DIET RECOMMENDATION 02/02/2019 SLP Diet Recommendations Dysphagia 2 (Fine chop) solids;Thin liquid Liquid Administration via Cup;Straw Medication Administration Other (Comment) Compensations Minimize environmental distractions;Slow rate;Small sips/bites;Multiple dry swallows after each bite/sip Postural Changes Remain semi-upright after after feeds/meals (Comment);Seated upright at 90 degrees   CHL IP OTHER RECOMMENDATIONS 02/02/2019   Oral Care Recommendations Oral care QID     CHL IP FOLLOW UP RECOMMENDATIONS 01/30/2019 Follow up  Recommendations Inpatient Rehab   CHL IP FREQUENCY AND DURATION 02/02/2019 Speech Therapy Frequency (ACUTE ONLY) min 2x/week Treatment Duration 2 weeks      CHL IP ORAL PHASE 02/02/2019 Oral Phase Impaired   Oral - Nectar Cup Holding of bolus;Delayed oral transit;Premature spillage   Oral - Thin Cup/Straw Piecemeal swallowing;Premature spillage   Oral - Puree Holding of bolus;Piecemeal swallowing;Premature spillage   Oral - Regular Piecemeal swallowing;Nasal reflux;Premature spillage   Oral - Pill WFL    CHL IP PHARYNGEAL PHASE 02/02/2019 Pharyngeal Phase Impaired   Pharyngeal- Nectar Cup Delayed swallow initiation-vallecula;Reduced airway/laryngeal closure;Penetration/Aspiration during swallow;Pharyngeal residue - valleculae Pharyngeal Material does not enter airway;Material enters airway, remains ABOVE vocal cords then ejected out   Pharyngeal- Thin Cup Delayed swallow initiation-vallecula;Reduced airway/laryngeal closure;Penetration/Apiration after swallow;Pharyngeal residue - valleculae;Pharyngeal residue - pyriform;Lateral channel residue Pharyngeal Material does not enter airway;Material enters airway, remains ABOVE vocal cords then ejected out   Pharyngeal- Puree Delayed swallow initiation-vallecula   Pharyngeal- Regular Delayed swallow initiation-vallecula;Pharyngeal residue - valleculae   Pharyngeal- Pill Delayed swallow initiation-vallecula Pill paused in the vallecular sinus briefly before clearing the pharynx      CHL IP CERVICAL ESOPHAGEAL PHASE 02/02/2019 Cervical Esophageal Phase [facility] Harlow Asa, MSP, CCC-SLP Speech Language Pathologist Office: [phone] Pager: [phone] Leigh Aurora 02/02/2019, 1:52 PM              ECHOCARDIOGRAM COMPLETE 01/29/2019 IMPRESSIONS   1. Left ventricular ejection fraction, by visual estimation, is <20%. The left ventricle has severely decreased function. Left ventricular septal wall thickness was mildly increased. Mildly increased left ventricular posterior  wall thickness.   2. There is apical ballooning with akinesis of the entire apical wall. There is akinesis of the mid infero and anteroseptum. There is akinesis of the mid and apical inferior, anterior and lateral walls and akineseis of the apical inferolateral wall.   3. Moderate, fixed thrombus on the apical wall of the left ventricle.   4. Definity contrast agent was given IV to delineate the left ventricular endocardial borders.   5. Global right ventricle has normal systolic function.The right ventricular size is normal. No increase in right ventricular wall thickness.   6. Left atrial size was normal.   7. Right atrial size was normal.   8. The mitral valve is normal in structure. Trivial mitral valve regurgitation. No evidence of mitral stenosis.   9. The tricuspid valve is normal in structure. Tricuspid valve regurgitation is mild.  10. The aortic valve is tricuspid. Aortic valve regurgitation is not visualized. No evidence of aortic valve sclerosis or stenosis.  11. The pulmonic valve was normal  in structure. Pulmonic valve regurgitation is not visualized.  12. Normal pulmonary artery systolic pressure.  13. The inferior vena cava is normal in size with greater than 50% respiratory variability, suggesting right atrial pressure of 3 mmHg.  14. Left ventricular diastolic parameters are consistent with Grade I diastolic dysfunction (impaired relaxation).  VAS US CAROTID 01/31/2019 Right Carotid: Velocities in the right ICA are consistent with a 1-39% stenosis.     This is a limited carotid study and only right side was studied.    [place] EKG SITE RITE 01/29/2019 If Site Rite image not attached, placement could not be confirmed due to current cardiac rhythm.   HISTORY OF PRESENT ILLNESS (from Dr Bess Harvest H&P on 01/28/2019) Jyair Kiraly is a 85 y.o. male PMH of HLD, HTN, DM, brought in for concern of weakness after fall. According to family he has been not at his normal baseline for now  about two weeks, feeling generally weak and withdrawn. This evening, he had a fall, and was brought in by EMS for eval. EMS was told by family that LKW was Monday but on reconnecting with family - he was unwell but was still talking and walking till sometime today morning. Exact LKW was unable to be ascertained but no stroke symptoms noted by family till the fall today. EDP evaluated the patient, found him to be plegic on left, facial droop on left and asked me if a code stroke should be activated. I recommended code stroke activation, assessed patient and spoke to family over the phone multiple times to get history. [name], daughter, was the one I spoke to and she is the one who provided me with the history in conjunction with her brother over the phone.  Exact LKW was not ascertainable but I was told that he did walk up and down the stairs but did not feel like he had good appetite, and did not want to do much other than lay on the couch. At baseline, up till 2 weeks ago, he was walking without a walker, did bathe and feed himself and only had mild memory problems. Two weeks ago he had a ER evaluation for fatigue and decreased appetite, but most of the work up remained unremarkable and he was d/c'd home from ER. He did have low grade fever and leukopenia at the time. COVID test at the time was negative. No recent COVID exposures reported. On EDP and my eval - he was VAN positive. Significant delay in obtaining information about LKW as the initial report per EMS was no clear LKW, at best 2 days ago, but as above, that turns out to be incorrect based on my conversations with family when asked detailed questions about his ambulation etc. EKG in ER concerning for anterior MI. EDP Dr. Charm Barges discussed with cardiology - likely subacute insult - no need for intervention. Formal cardiology consult to follow. LKW: within last 24h (sometime in the AM of 01/28/2019) tpa given?: no, could not confirm LKW  within 4.5h Premorbid modified Rankin scale (mRS): 1  HOSPITAL COURSE Mr. Temiloluwa Laredo is a 85 y.o. male with history of HLD, HTN, DM presenting after a fall with L sided weakness face, arm and leg.   Stroke: bilateral anterior and posterior infarcts with L ICA occlusion s/p IR w TICI3 revascularization, likely embolic d/t new AF w/ RVR and LV thrombus in the setting of undiagnosed recent MI  Code Stroke CT head No acute abnormality. pontiue hypodensity. ASPECTS 10.  CTA head & neck R ICA occlusion. Decreased R MCA flow w/ good cross-filling.  CT perfusion 13 mL core infarct R watershed w/ delayed perfusion R MCA d/t R ICA occlusion   Cerebral Angio occlusion intra and extracranial ICA with TICI3 reperfusion    MRI  Scattered small cerebrum and cerebellar bilateral infarcts (greatest R frontoparietal)  MRA  Limited unremarkable  Carotid Doppler  R 1-39% stenosis    2D Echo severe LV dysfunction < 20% w/ possible stress cardiomyopathy w/ apical ballooning and LV thrombus  TG 244, HDL < 10  Direct LDL - 58.5  HgbA1c 8.8  aspirin 81 mg daily prior to admission, treated with warfarin in hospital but difficulty with INR goal. Planned transition to Eliquis once INR < 2.0 - now off AC due to comfort care measures  Therapy recommendations: family would like home hospice   Disposition:  home with family, [facility] to follow Home DME:  hospital bed, table and 3N1   PAF w/ RVR, new diagnosis  Home anticoagulation:  none   CHA2DS2-VASc Score = at least 6             Age in Years:  ?21   +2                        Sex:  Male   0               Hypertension History:  yes   +1                        Diabetes Mellitus:  yes   +1      Congestive Heart Failure History:  0             Vascular Disease History:  yes   +1                            Stroke/TIA/Thromboembolism History:  yes   +2  Started on amiodarone drip w/ bolus due to RVR -> transition to po amiodarone ->  stopped due to comfort care measures  Treated with coumadin in hospital, difficulty with INR, warfarin now stopped, planned to transition to Eliquis 12/30 once INR < 2.0 -> now off AC due to comfort care measures  INR 2.3->3.2->4.4->2.9    Abnormal EKG w/ ST elevation LV Systolic Dysunction, LV thrombus  Felt likely reflective of ICA occlusion w/ demand ischemia  EKG w/ concern for anterior MI  Trop 15519->15715->11893   2D Echo severe LV dysfunction < 20% w/ possible stress cardiomyopathy w/ apical ballooning and LV thrombus  No BB d/t low BP  BNP 4321.1->2,302->2613.1   Probable anterolateral MI prior to admission per cardiology  Acute Respiratory failure   CCM signed off 01/31/19  Recurrent episode of respiratory distress  sats to 80s on 2L w/ wheezing 12/25 during the night  Likely d/t volume overload  Treated w/ lasix  Repeat CXR 12/25 w/ some edema, ? PNA  Repeat CXR 12/27 Improved but persistent bilateral interstitial and airspace disease  maintenance lasix -> now discontinued due to comfort care measures    Hypotension Hx Hypertension  Home meds:  atenolol 25 daily, lisinopril 5  Low BP felt to be related to AF in setting of severe LV dysfunction  BP goal 110-140  off levophed   BP 80-90s w/ decreased po intake - on gentle hydration @  30cc -> now off  Off lasix for comfort care measures (planned to only give if SBP > 100)  Hyperlipidemia  Home meds:  pravachol 40  TG 244 and HDL < 10  Direct LDL - 58.5, goal < 70  AST 33, ALT 49 on 02/02/19  Treated with lipitor 40 -> now off due to comfort care measures  Diabetes type II Uncontrolled  Home meds:  Metformin 500 bid  HgbA1c 8.8, goal < 7.0  UGIB, resolved - ? Stress ulcer  Coffee ground color emesis - resolved   Off NG suction  On diet now - pleasure eating  Leukocytosis, worsening   WBC 10.1->10.4->20.8->11.9->10.9->20.2->22.0    Repeat UA WBC 6-10    CXR pulmonary  edema, possible pleural effusion  AKI on CKD IIIA  Cre 1.57->1.48->1.76->1.60->1.53->1.64->1.48    Decreased po intake  Gentle hydration @ 30cc -> now off  Other Stroke Risk Factors  Advanced age  Other Active Problems  Mild cognitive decline at baseline suspect this is now aggravated by his stroke, heart failure and sundowning.- agitation improving  Physical deconditioning over past 2 weeks PTA going from independent walking to using a walker. Had ER eval 2 wks PTA for fatigue and decreased appetite.   Hypokalemia, resolved  Malnutrition d/t poor intake. Given Ensure Enlive in hospital   DISCHARGE EXAM Vitals:   02/11/19 0024 02/11/19 0451 02/11/19 0513 02/11/19 0806  BP: 96/62  (!) 89/62 106/88  Pulse: 95  (!) 101 84  Resp: 20  (!) 24 (!) 22  Temp: 97.7 F (36.5 C)  99.2 F (37.3 C) (!) 96.9 F (36.1 C)  TempSrc: Oral  Oral Axillary  SpO2: 94% 96% 98% (!) 86%  Weight:      Height:      General- frail elderly caucasian male in mild distress.  Ophthalmologic- fundi not visualized due to noncooperation.  Cardiovascular - Regular rhythm and rate, not in afib.  Neuro- drowsy sleepy, eyes closed but able to briefly open eyes on voice, not orientated to self, place, or time. Lethargic not name or repeat. Not follow simple commands. Speech incoherent and difficulty to understand. No gaze deviation, attending to both side. PERRL.Mild left facial droop. Tongue midline. Left UE4/5, no drift. LLE proximal4/5, distal5/5 withtoeDF and PF. RUE and RLE 5/5. Sensation symmtrical subjectively. FTN not cooperative. Gait not tested.   Discharge Diet   Dysphagia 2 thin liquids, comfort feeds  DISCHARGE PLAN  Disposition:  Home w/ family  Comfort care  [facility] to follow   45 minutes were spent preparing discharge.  Rosalin Hawking, MD PhD Stroke Neurology 02/12/2019 12:17 AM

## 2019-02-07 NOTE — Progress Notes (Signed)
STROKE TEAM PROGRESS NOTE   INTERVAL HISTORY Patient sitting in bed, awake alert, orientated to place, self, age, but not to time. Stated that he wants to go home and his wife is able to taking care of him. He ate well this morning. When ask whether he would take the medications we give to him, he stated "yes". His LUE weakness much improved over time.   I talked with family, wife, daughter and son at bedside. They are OK to take him home with home PT/OT, home nurse, and home hospice consult. I will restart his coumadin, amiodarone and ASA as well as lipitor. Plan to d/c home tomorrow. Continue lasix and vital sign monitoring.   IMAGES CT Code Stroke CTA Head W/WO contrast  Result Date: 01/28/2019 CLINICAL DATA:  Stroke.  Slurred speech.  Left facial droop. EXAM: CT ANGIOGRAPHY HEAD AND NECK CT PERFUSION BRAIN TECHNIQUE: Multidetector CT imaging of the head and neck was performed using the standard protocol during bolus administration of intravenous contrast. Multiplanar CT image reconstructions and MIPs were obtained to evaluate the vascular anatomy. Carotid stenosis measurements (when applicable) are obtained utilizing NASCET criteria, using the distal internal carotid diameter as the denominator. Multiphase CT imaging of the brain was performed following IV bolus contrast injection. Subsequent parametric perfusion maps were calculated using RAPID software. CONTRAST:  OMNIPAQUE IOHEXOL 350 MG/ML SOLN COMPARISON:  CT head 01/28/2019 FINDINGS: CTA NECK FINDINGS Aortic arch: Proximal great vessels widely patent. Incomplete imaging of the aortic arch. Right carotid system: Right common carotid artery widely patent. 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 Right internal carotid artery remains occluded through the cavernous segment. Reconstitution of the supraclinoid internal carotid artery on the right. Left carotid system: Left carotid bifurcation widely patent without significant  stenosis. Vertebral arteries: Both vertebral arteries are patent to the basilar without significant stenosis. Skeleton: No acute skeletal abnormality.  Poor dentition. Other neck: Thyroid goiter.  No mass lesion. Upper chest: Mild pleural thickening along the major fissure on the right. Otherwise lungs clear. Review of the MIP images confirms the above findings CTA HEAD FINDINGS Anterior circulation: Right internal carotid artery is occluded through the cavernous segment with reconstitution of the supraclinoid segment. Both anterior and middle cerebral arteries are patent without significant stenosis or thrombus. Right anterior and middle cerebral arteries supplied through the anterior communicating artery. No significant posterior communicating artery identified on the right. Left cavernous carotid widely patent. Posterior circulation: Both vertebral arteries patent to the basilar. PICA patent. Basilar widely patent. AICA, superior cerebellar, posterior cerebral arteries patent bilaterally. Venous sinuses: Minimal venous contrast due to arterial phase scanning Anatomic variants: None Review of the MIP images confirms the above findings CT Brain Perfusion Findings: ASPECTS: 10 CBF (<30%) Volume: 13mL Perfusion (Tmax>6.0s) volume: Mismatch Volume: Infarction Location:Right MCA territory shows diffuse delayed perfusion. Core infarct in the right watershed territory in the deep white matter. IMPRESSION: 1. 13 mL core infarct in the watershed territory on the right. Large area of delayed perfusion right MCA territory due to occlusion of the right internal carotid artery. Right anterior middle cerebral arteries are patent and supplied through the anterior communicating artery. Collateral circulation is most likely the reason for delayed perfusion in the right MCA territory. 2. No other significant intracranial stenosis. 3. These results were called by telephone at the time of interpretation on 01/28/2019 at  9:54 pm to provider [facility] , who verbally acknowledged these results. Electronically Signed   By: Marlan Palau  M.D.   On: 01/28/2019 21:56   CT Code Stroke CTA Neck W/WO contrast  Result Date: 01/28/2019 CLINICAL DATA:  Stroke.  Slurred speech.  Left facial droop. EXAM: CT ANGIOGRAPHY HEAD AND NECK CT PERFUSION BRAIN TECHNIQUE: Multidetector CT imaging of the head and neck was performed using the standard protocol during bolus administration of intravenous contrast. Multiplanar CT image reconstructions and MIPs were obtained to evaluate the vascular anatomy. Carotid stenosis measurements (when applicable) are obtained utilizing NASCET criteria, using the distal internal carotid diameter as the denominator. Multiphase CT imaging of the brain was performed following IV bolus contrast injection. Subsequent parametric perfusion maps were calculated using RAPID software. CONTRAST:  OMNIPAQUE IOHEXOL 350 MG/ML SOLN COMPARISON:  CT head 01/28/2019 FINDINGS: CTA NECK FINDINGS Aortic arch: Proximal great vessels widely patent. Incomplete imaging of the aortic arch. Right carotid system: Right common carotid artery widely patent. 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 Right internal carotid artery remains occluded through the cavernous segment. Reconstitution of the supraclinoid internal carotid artery on the right. Left carotid system: Left carotid bifurcation widely patent without significant stenosis. Vertebral arteries: Both vertebral arteries are patent to the basilar without significant stenosis. Skeleton: No acute skeletal abnormality.  Poor dentition. Other neck: Thyroid goiter.  No mass lesion. Upper chest: Mild pleural thickening along the major fissure on the right. Otherwise lungs clear. Review of the MIP images confirms the above findings CTA HEAD FINDINGS Anterior circulation: Right internal carotid artery is occluded through the cavernous segment with reconstitution of the  supraclinoid segment. Both anterior and middle cerebral arteries are patent without significant stenosis or thrombus. Right anterior and middle cerebral arteries supplied through the anterior communicating artery. No significant posterior communicating artery identified on the right. Left cavernous carotid widely patent. Posterior circulation: Both vertebral arteries patent to the basilar. PICA patent. Basilar widely patent. AICA, superior cerebellar, posterior cerebral arteries patent bilaterally. Venous sinuses: Minimal venous contrast due to arterial phase scanning Anatomic variants: None Review of the MIP images confirms the above findings CT Brain Perfusion Findings: ASPECTS: 10 CBF (<30%) Volume: 13mL Perfusion (Tmax>6.0s) volume: Mismatch Volume: Infarction Location:Right MCA territory shows diffuse delayed perfusion. Core infarct in the right watershed territory in the deep white matter. IMPRESSION: 1. 13 mL core infarct in the watershed territory on the right. Large area of delayed perfusion right MCA territory due to occlusion of the right internal carotid artery. Right anterior middle cerebral arteries are patent and supplied through the anterior communicating artery. Collateral circulation is most likely the reason for delayed perfusion in the right MCA territory. 2. No other significant intracranial stenosis. 3. These results were called by telephone at the time of interpretation on 01/28/2019 at 9:54 pm to provider [facility] , who verbally acknowledged these results. Electronically Signed   By: Marlan Palau M.D.   On: 01/28/2019 21:56   MR MRA HEAD WO CONTRAST  Result Date: 01/29/2019 CLINICAL DATA:  Stroke follow-up. Left-sided weakness. Status post endovascular revascularization of occluded right ICA. EXAM: MRI HEAD WITHOUT CONTRAST MRA HEAD WITHOUT CONTRAST TECHNIQUE: Multiplanar, multiecho pulse sequences of the brain and surrounding structures were obtained without  intravenous contrast. Angiographic images of the head were obtained using MRA technique without contrast. COMPARISON:  Head CT, CTA, and CTP 01/28/2019 FINDINGS: MRI HEAD FINDINGS The study is motion degraded throughout with some sequences being severely degraded (including nondiagnostic susceptibility weighted imaging). Brain: There are scattered small acute to early subacute infarcts in the right cerebral hemisphere  most notably involving frontoparietal cortex in the perirolandic region. Small infarcts are also present more inferiorly in the right parietal lobe and in the right globus pallidus, and there is a single punctate subcortical infarct in the left parietal lobe. There are small acute to early subacute bilateral cerebellar infarcts as well. Motion artifact limits assessment for intracranial hemorrhage. There is no midline shift or sizable extra-axial fluid collection. No age advanced chronic white matter disease is evident. Cerebral atrophy is not greater than expected for age. Vascular: Major intracranial vascular flow voids are grossly preserved. Skull and upper cervical spine: No gross marrow lesion. Sinuses/Orbits: Unremarkable orbits. Left maxillary sinusitis. Left anterior ethmoid air cell opacification. No significant mastoid fluid. Other: None. MRA HEAD FINDINGS The study is intermittently severely motion degraded limiting detailed evaluation. The visualized distal vertebral arteries are patent to the basilar and codominant. Cerebellar arteries are not well seen due to motion. The basilar artery is patent with focal severe motion artifact through its midportion and no evidence of significant stenosis elsewhere. Both PCAs are patent proximally. The included distal cervical and intracranial portions of both internal carotid arteries are patent with assessment for stenosis limited by motion. A1 and M1 segments are patent bilaterally with limited assessment of the branch vessels. IMPRESSION: 1.  Severely motion degraded examination. 2. Scattered small acute to early subacute infarcts in the cerebrum and cerebellum bilaterally, greatest in the right frontoparietal region. 3. Limited head MRA due to motion with patency of the large intracranial arteries as above. Electronically Signed   By: Sebastian Ache M.D.   On: 01/29/2019 14:04   MR BRAIN WO CONTRAST  Result Date: 01/29/2019 CLINICAL DATA:  Stroke follow-up. Left-sided weakness. Status post endovascular revascularization of occluded right ICA. EXAM: MRI HEAD WITHOUT CONTRAST MRA HEAD WITHOUT CONTRAST TECHNIQUE: Multiplanar, multiecho pulse sequences of the brain and surrounding structures were obtained without intravenous contrast. Angiographic images of the head were obtained using MRA technique without contrast. COMPARISON:  Head CT, CTA, and CTP 01/28/2019 FINDINGS: MRI HEAD FINDINGS The study is motion degraded throughout with some sequences being severely degraded (including nondiagnostic susceptibility weighted imaging). Brain: There are scattered small acute to early subacute infarcts in the right cerebral hemisphere most notably involving frontoparietal cortex in the perirolandic region. Small infarcts are also present more inferiorly in the right parietal lobe and in the right globus pallidus, and there is a single punctate subcortical infarct in the left parietal lobe. There are small acute to early subacute bilateral cerebellar infarcts as well. Motion artifact limits assessment for intracranial hemorrhage. There is no midline shift or sizable extra-axial fluid collection. No age advanced chronic white matter disease is evident. Cerebral atrophy is not greater than expected for age. Vascular: Major intracranial vascular flow voids are grossly preserved. Skull and upper cervical spine: No gross marrow lesion. Sinuses/Orbits: Unremarkable orbits. Left maxillary sinusitis. Left anterior ethmoid air cell opacification. No significant mastoid  fluid. Other: None. MRA HEAD FINDINGS The study is intermittently severely motion degraded limiting detailed evaluation. The visualized distal vertebral arteries are patent to the basilar and codominant. Cerebellar arteries are not well seen due to motion. The basilar artery is patent with focal severe motion artifact through its midportion and no evidence of significant stenosis elsewhere. Both PCAs are patent proximally. The included distal cervical and intracranial portions of both internal carotid arteries are patent with assessment for stenosis limited by motion. A1 and M1 segments are patent bilaterally with limited assessment of the branch vessels. IMPRESSION: 1.  Severely motion degraded examination. 2. Scattered small acute to early subacute infarcts in the cerebrum and cerebellum bilaterally, greatest in the right frontoparietal region. 3. Limited head MRA due to motion with patency of the large intracranial arteries as above. Electronically Signed   By: Sebastian Ache M.D.   On: 01/29/2019 14:04   IR CT Head Ltd  Result Date: 01/30/2019 INDICATION: New onset of right gaze deviation, and left hemiplegia. 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 EXAM: 1. EMERGENT LARGE VESSEL OCCLUSION THROMBOLYSIS (anterior CIRCULATION) COMPARISON:  CT angiogram of the head and neck of January 28, 2019. MEDICATIONS: Ancef 2 g IV was administered within 1 hour of the procedure. ANESTHESIA/SEDATION: General anesthesia. CONTRAST:  Isovue 300 approximately 110 mL. FLUOROSCOPY TIME:  Fluoroscopy Time: 36 minutes 12 seconds (1466 mGy). COMPLICATIONS: None immediate. TECHNIQUE: Following a full explanation of the procedure along with the potential associated complications, an informed witnessed consent was obtained from the patient in a 3 way phone call. The risks of intracranial hemorrhage of 10%, worsening  neurological deficit, ventilator dependency, death and inability to revascularize were all reviewed in detail with the patient's daughter. The patient was then put under general anesthesia by the Department of Anesthesiology at [facility]. The right groin was prepped and draped in the usual sterile fashion. Thereafter using modified Seldinger technique, transfemoral access into the right common femoral artery was obtained without difficulty. Over a 0.035 inch guidewire a 5 French Pinnacle sheath was inserted. Through this, and also over a 0.035 inch guidewire a 5 [place] JB 1 catheter was advanced to the aortic arch region and selectively positioned in the innominate artery, the right common carotid artery and the left common carotid artery. FINDINGS: The innominate artery angiogram demonstrates the origin of the right subclavian artery and the right common carotid artery to be widely patent. The right vertebral artery at its origin demonstrates wide patency. More distally the vessel is seen to opacify to the cranial skull base. On the left lateral projection, the right vertebrobasilar junction and the opacified portion of the basilar artery and the posterior cerebral arteries demonstrate wide patency with free advancement of contrast into the delayed capillary phase. The right common carotid arteriogram demonstrates the right external carotid artery and its major branches to be widely patent. The right internal carotid artery demonstrated complete angiographic occlusion just distal to the bulb with a flame shape configuration. There is no delayed evidence of string sign. Also no reconstitution of the right internal carotid artery intracranially is seen from the external carotid artery branches. PROCEDURE: The diagnostic JB 1 catheter in the right common carotid artery was then exchanged over a 0.035 inch 300 cm Rosen exchange guidewire for an 8 [place] Pinnacle sheath in the right groin which was connected  to continuous heparinized saline infusion. Over the [facility] exchange guidewire, a 95 cm 087 [place] balloon guide catheter which had been prepped with 50% contrast and 50% heparinized saline infusion was then advanced and positioned just proximal to the origin of the right internal carotid artery. The guidewire was removed. Good aspiration obtained from the hub of the Wal[place] balloon guide catheter. A gentle control arteriogram performed through the balloon guide catheter continued to demonstrate no change in the extracranial or intracranial circulations. Over a 0.014 inch standard Synchro micro guidewire with a J configuration, a 132 cm 6 [place] Catalyst guide catheter inside of which was  an 021 Trevo ProVue microcatheter was advanced with the micro guidewire leading with the J configuration to the supraclinoid segment. The micro guidewire was then gently manipulated with a torque device to enter the right middle cerebral artery into the distal M1 M2 junction. This was then followed by the microcatheter. The micro guidewire was then gently retrieved and removed. There was free aspiration of blood from the hub of the microcatheter. A gentle control arteriogram performed through this demonstrated excellent flow into the MCA bifurcation branches. A 4 mm x 40 mm Solitaire X retrieval device was then deployed with the distal M1 segment, and the proximal portion in the supraclinoid segment. At this time, the Catalyst guide catheter was advanced into the proximal aspect of the clot. With constant aspiration being applied at the hub of the [facility] guide catheter with a 60 mL syringe, and constant aspiration being applied with a Penumbra aspiration device at the hub of the Catalyst guide catheter, for 2 minutes, the combination of the retrieval device, the microcatheter, the 6 [place] Catalyst guide catheter and eventually the balloon guide catheter was retrieved and removed. Copious amounts of soft clot was seen in [place],  the 6 [place] Catalyst guide catheter, and also the Walrus balloon guide catheter. A diagnostic JB 1 catheter advanced into the right common carotid artery demonstrated mild to moderately improved flow in the right internal carotid proximally. There continued be occlusion in the more distal cavernous and supraclinoid segments. The combination was advanced again as a mentioned above. The micro guidewire was advanced into the right middle cerebral artery M1 M2 region followed by the microcatheter. The 6 French Catalyst guidewire was advanced into the occluded proximal portion of the clot. The 4 mm x 40 mm retrieval device was again deployed as above. With aspiration being applied at the hub of the 6 [place] Catalyst guide catheter with a Penumbra aspiration device, and a 60 mL syringe at the hub of the balloon the Catalyst guide catheter was retrieved the to the origin of the right internal carotid artery demonstrated guide catheter for about 2-1/2 minutes, the combination was again retrieved and removed. At this time free aspiration was noted at the hub of the [facility] balloon guide catheter in the mid right internal carotid artery. The aspirate demonstrated copious amounts of dark clot with small pieces. A control arteriogram performed through the [facility] guide catheter at the origin of the right internal carotid now demonstrated complete angiographic revascularization of the right internal carotid artery extra cranially and intracranially. The right middle cerebral artery and the right anterior cerebral artery also demonstrated complete angiographic revascularization with no evidence of intraluminal filling defects or of occlusions. Spasm was noted in the right internal carotid artery and responded to 3 aliquots of 25 mcg of nitroglycerin with elevation of the spasm. No evidence of intimal flap was noted. The balloon guide was then retrieved and removed. The 8 French Pinnacle sheath in the right groin was then replaced  with an 8 French Angio-Seal closure device with hemostasis. The right groin appeared soft. Distal pulses remained Dopplerable in the dorsalis pedis, and the posterior tibial arteries bilaterally unchanged. The patient was loaded with 81 mg of aspirin, and 180 mg of Brilinta via an orogastric tube. Additionally, the patient was given 7.5 mg of intra-arterial Integrilin in order to prevent formation of platelet aggregates. A flat panel CT of the brain demonstrated no evidence of hemorrhage, mass effect, or midline shift. The patient's general anesthesia was then reversed.  Upon recovery, the patient was able to obey simple commands with movement of the right upper and right lower extremities. Left over left demonstrated more activity. Patient was then transferred to the neuro ICU to continue with post thrombectomy management. IMPRESSION: Status post endovascular complete revascularization of the right internal carotid artery extra cranially and intracranially and partially the right middle cerebral artery proximally at its origin with 2 passes with the Solitaire X 4 x 40 mm retrieval device, and Penumbra aspiration achieving a TICI 3 revascularization of the right middle cerebral artery, and the right anterior cerebral artery distributions. PLAN: As per referring MD. Electronically Signed   By: Julieanne Cotton M.D.   On: 01/29/2019 10:44   CT C-SPINE NO CHARGE  Result Date: 01/28/2019 CLINICAL DATA:  Trauma.  Rule out spine fracture EXAM: CT CERVICAL SPINE WITHOUT CONTRAST TECHNIQUE: Multidetector CT imaging of the cervical spine was performed without intravenous contrast. Multiplanar CT image reconstructions were also generated. COMPARISON:  None. FINDINGS: Alignment: Normal Skull base and vertebrae: Negative for fracture Soft tissues and spinal canal: Goiter with enlargement of the right lobe of the thyroid. No adenopathy in the neck. Disc levels: Mild disc degeneration and spurring C5-6 with foraminal  narrowing bilaterally. Upper chest: Mild thickening of the major fissure on the right. Otherwise lung apices clear. Other: None IMPRESSION: Negative for cervical spine fracture. Electronically Signed   By: Marlan Palau M.D.   On: 01/28/2019 21:59   CT Code Stroke Cerebral Perfusion with contrast  Result Date: 01/28/2019 CLINICAL DATA:  Stroke.  Slurred speech.  Left facial droop. EXAM: CT ANGIOGRAPHY HEAD AND NECK CT PERFUSION BRAIN TECHNIQUE: Multidetector CT imaging of the head and neck was performed using the standard protocol during bolus administration of intravenous contrast. Multiplanar CT image reconstructions and MIPs were obtained to evaluate the vascular anatomy. Carotid stenosis measurements (when applicable) are obtained utilizing NASCET criteria, using the distal internal carotid diameter as the denominator. Multiphase CT imaging of the brain was performed following IV bolus contrast injection. Subsequent parametric perfusion maps were calculated using RAPID software. CONTRAST:  OMNIPAQUE IOHEXOL 350 MG/ML SOLN COMPARISON:  CT head 01/28/2019 FINDINGS: CTA NECK FINDINGS Aortic arch: Proximal great vessels widely patent. Incomplete imaging of the aortic arch. Right carotid system: Right common carotid artery widely patent. 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 Right internal carotid artery remains occluded through the cavernous segment. Reconstitution of the supraclinoid internal carotid artery on the right. Left carotid system: Left carotid bifurcation widely patent without significant stenosis. Vertebral arteries: Both vertebral arteries are patent to the basilar without significant stenosis. Skeleton: No acute skeletal abnormality.  Poor dentition. Other neck: Thyroid goiter.  No mass lesion. Upper chest: Mild pleural thickening along the major fissure on the right. Otherwise lungs clear. Review of the MIP images confirms the above findings CTA HEAD FINDINGS Anterior  circulation: Right internal carotid artery is occluded through the cavernous segment with reconstitution of the supraclinoid segment. Both anterior and middle cerebral arteries are patent without significant stenosis or thrombus. Right anterior and middle cerebral arteries supplied through the anterior communicating artery. No significant posterior communicating artery identified on the right. Left cavernous carotid widely patent. Posterior circulation: Both vertebral arteries patent to the basilar. PICA patent. Basilar widely patent. AICA, superior cerebellar, posterior cerebral arteries patent bilaterally. Venous sinuses: Minimal venous contrast due to arterial phase scanning Anatomic variants: None Review of the MIP images confirms the above findings CT Brain Perfusion Findings: ASPECTS: 10 CBF (<30%) Volume:  13mL Perfusion (Tmax>6.0s) volume: 272mL Mismatch Volume: 259mL Infarction Location:Right MCA territory shows diffuse delayed perfusion. Core infarct in the right watershed territory in the deep white matter. IMPRESSION: 1. 13 mL core infarct in the watershed territory on the right. Large area of delayed perfusion right MCA territory due to occlusion of the right internal carotid artery. Right anterior middle cerebral arteries are patent and supplied through the anterior communicating artery. Collateral circulation is most likely the reason for delayed perfusion in the right MCA territory. 2. No other significant intracranial stenosis. 3. These results were called by telephone at the time of interpretation on 01/28/2019 at 9:54 pm to provider [facility] , who verbally acknowledged these results. Electronically Signed   By: Marlan Palauharles  Clark M.D.   On: 01/28/2019 21:56   DG CHEST PORT 1 VIEW  Result Date: 02/06/2019 CLINICAL DATA:  Shortness of breath, wheezing EXAM: PORTABLE CHEST 1 VIEW COMPARISON:  Radiograph 02/04/2019 FINDINGS: Similar appearance of the bilateral interstitial and airspace disease  throughout both lungs. No visible pneumothorax or effusion. Increasing septal thickening and fissural thickening. Cardiomediastinal contours are stable. The osseous structures appear diffusely demineralized which may limit detection of small or nondisplaced fractures. No acute osseous or soft tissue abnormality. Support devices and cardiac monitoring leads overlie the chest. IMPRESSION: Similar appearance of the bilateral interstitial and airspace disease throughout both lungs, which could reflect edema or multifocal pneumonia. Favor at least some component of edema given the increasing fissural thickening Electronically Signed   By: Kreg ShropshirePrice  DeHay M.D.   On: 02/06/2019 02:00   DG CHEST PORT 1 VIEW  Result Date: 02/04/2019 CLINICAL DATA:  Increased shortness of breath EXAM: PORTABLE CHEST 1 VIEW COMPARISON:  January 29, 2019 FINDINGS: The heart size and mediastinal contours are unchanged. There is patchy/fluffy airspace opacities seen predominantly within the perihilar region with increased interstitial markings throughout both lungs. No pleural effusion is seen. No acute osseous abnormality. IMPRESSION: Interval development of patchy/fluffy perihilar airspace opacities. This could be due to asymmetric pulmonary edema and/or infectious etiology. Electronically Signed   By: Jonna ClarkBindu  Avutu M.D.   On: 02/04/2019 00:35   DG CHEST PORT 1 VIEW  Result Date: 01/29/2019 CLINICAL DATA:  Stroke due to embolism EXAM: PORTABLE CHEST 1 VIEW COMPARISON:  01/15/2019 FINDINGS: Interstitial coarsening with Charyl DancerKerley lines. No visible effusion or pneumothorax. Normal heart size and stable mediastinal contours. Enteric tube tip and side-port reaches the stomach. IMPRESSION: Interstitial pulmonary edema. Electronically Signed   By: Marnee SpringJonathon  Watts M.D.   On: 01/29/2019 05:08   DG Chest Port 1 View  Result Date: 01/15/2019 CLINICAL DATA:  85 year old male with fever. EXAM: PORTABLE CHEST 1 VIEW COMPARISON:  None. FINDINGS:  Background of mild emphysema. No focal consolidation, pleural effusion, or pneumothorax. The cardiac silhouette is within normal limits. No acute osseous pathology. IMPRESSION: No active disease. Electronically Signed   By: Elgie CollardArash  Radparvar M.D.   On: 01/15/2019 18:17   DG Abd Portable 1V  Result Date: 01/29/2019 CLINICAL DATA:  OG tube placement. EXAM: PORTABLE ABDOMEN - 1 VIEW COMPARISON:  None. FINDINGS: Tip of the enteric tube is below the diaphragm in the stomach, the side port is in the region of the gastroesophageal junction. Recommend advancement of least 3 cm for optimal placement. Excreted IV contrast in both renal collecting systems and the urinary bladder from prior IV contrast. Lobular contour of the bladder suggests bladder diverticula or chronic bladder outlet obstruction no bowel obstruction. Surgical clips in the right upper quadrant likely  from cholecystectomy. IMPRESSION: Tip of the enteric tube below the diaphragm in the stomach, the side-port in the region of the gastroesophageal junction. Recommend advancement of least 3 cm for optimal placement. Electronically Signed   By: Keith Rake M.D.   On: 01/29/2019 03:27   DG Swallowing Func-Speech Pathology  Result Date: 02/02/2019 Objective Swallowing Evaluation: Type of Study: MBS-Modified Barium Swallow Study  Patient Details Name: Joan Avetisyan MRN: [id number] Date of Birth: 1930-09-25 Today's Date: 02/02/2019 Time: SLP Start Time (ACUTE ONLY): 1220 -SLP Stop Time (ACUTE ONLY): 1245 SLP Time Calculation (min) (ACUTE ONLY): 25 min Past Medical History: Past Medical History: Diagnosis Date . Hypercholesteremia  . Syncope  . Syncope  . Ventricular hypertrophy  Past Surgical History: Past Surgical History: Procedure Laterality Date . IR ANGIO INTRA EXTRACRAN SEL COM CAROTID INNOMINATE UNI L MOD SED  01/29/2019 . IR ANGIO VERTEBRAL SEL SUBCLAVIAN INNOMINATE UNI R MOD SED  01/29/2019 . IR CT HEAD LTD  01/29/2019 . IR PERCUTANEOUS ART  THROMBECTOMY/INFUSION INTRACRANIAL INC DIAG ANGIO  01/29/2019 HPI: Hisao Doo is a 85 y.o. male who has a PMH including but not limited to syncope, HLD.  He presented to [facility] ED 12/16 with left hemiplegia.  CTA showed R ICA occlusion.  He was subsequently taken to IR for arteriogram with complete revascularization of occluded extracranial and intracranial R ICA. CXR 12/17: interstitial edema.  CT 12/16: R MCA infarct in deep white matter  Subjective: Pt seen in radiology for MBS. Assessment / Plan / Recommendation CHL IP CLINICAL IMPRESSIONS 02/02/2019 Clinical Impression Pt seen in radiology for MBS to objectively assess swallow function and safety, based on clinical presentation at bedside. Pt was uprght in chair, awake and alert. He is hard of hearing. Pt was given trials of nectar thick liquid, thin liquid, puree, and solid textures. He was also given a barium tablet in water.  Orally, pt exhibited oral holding and piecemeal swallow, with premature spillage to the vallecular sinus. Pharyngeal swallow is characterized by trigger of swallow reflex at the vallecular sinus across consistencies. Very trace subepiglottic penetration was noted during the swallow of thin and nectar thick liquids, however, penetrate cleared completely and spontaeously and was not aspirated. Mild lateral channel and vallecular residue was noted after the swallow on most consistencies, which cleared with second/dry swallow. Barium tablet cleared the oral cavity without delay, but was noted to pause in the vallecular sinus before clearing with second swallow. Esophageal sweep revealed it to be clear. Recommend continuing with Dys 2 solids and thin liquids, meds either whole or crushed in puree (based on pt mentation, which is likely to vary due to dx dementia). Recommend assistance with feeding as needed, again based on pt mentation. Safe swallow precautions were reviewed with RN, and sent in written form with transport to be placed at  [facility]. SLP will follow up to assess diet tolerance and continue education.  SLP Visit Diagnosis Dysphagia, oropharyngeal phase (R13.12)     Impact on safety and function Mild aspiration risk;Moderate aspiration risk   CHL IP TREATMENT RECOMMENDATION 02/02/2019 Treatment Recommendations Therapy as outlined in treatment plan below   Prognosis 02/02/2019 Prognosis for Safe Diet Advancement Fair Barriers to Reach Goals Cognitive deficits   CHL IP DIET RECOMMENDATION 02/02/2019 SLP Diet Recommendations Dysphagia 2 (Fine chop) solids;Thin liquid Liquid Administration via Cup;Straw Medication Administration Other (Comment) Compensations Minimize environmental distractions;Slow rate;Small sips/bites;Multiple dry swallows after each bite/sip Postural Changes Remain semi-upright after after feeds/meals (Comment);Seated upright at 90 degrees  CHL IP OTHER RECOMMENDATIONS 02/02/2019   Oral Care Recommendations Oral care QID     CHL IP FOLLOW UP RECOMMENDATIONS 01/30/2019 Follow up Recommendations Inpatient Rehab   CHL IP FREQUENCY AND DURATION 02/02/2019 Speech Therapy Frequency (ACUTE ONLY) min 2x/week Treatment Duration 2 weeks      CHL IP ORAL PHASE 02/02/2019 Oral Phase Impaired   Oral - Nectar Cup Holding of bolus;Delayed oral transit;Premature spillage   Oral - Thin Cup/Straw Piecemeal swallowing;Premature spillage   Oral - Puree Holding of bolus;Piecemeal swallowing;Premature spillage   Oral - Regular Piecemeal swallowing;Nasal reflux;Premature spillage   Oral - Pill WFL    CHL IP PHARYNGEAL PHASE 02/02/2019 Pharyngeal Phase Impaired   Pharyngeal- Nectar Cup Delayed swallow initiation-vallecula;Reduced airway/laryngeal closure;Penetration/Aspiration during swallow;Pharyngeal residue - valleculae Pharyngeal Material does not enter airway;Material enters airway, remains ABOVE vocal cords then ejected out   Pharyngeal- Thin Cup Delayed swallow initiation-vallecula;Reduced airway/laryngeal closure;Penetration/Apiration after  swallow;Pharyngeal residue - valleculae;Pharyngeal residue - pyriform;Lateral channel residue Pharyngeal Material does not enter airway;Material enters airway, remains ABOVE vocal cords then ejected out   Pharyngeal- Puree Delayed swallow initiation-vallecula   Pharyngeal- Regular Delayed swallow initiation-vallecula;Pharyngeal residue - valleculae   Pharyngeal- Pill Delayed swallow initiation-vallecula Pill paused in the vallecular sinus briefly before clearing the pharynx      CHL IP CERVICAL ESOPHAGEAL PHASE 02/02/2019 Cervical Esophageal Phase [facility] Harlow Asa, MSP, CCC-SLP Speech Language Pathologist Office: [phone] Pager: [phone] Leigh Aurora 02/02/2019, 1:52 PM              ECHOCARDIOGRAM COMPLETE  Result Date: 01/29/2019   ECHOCARDIOGRAM REPORT   Patient Name:   ZAYDEN MAFFEI Date of Exam: 01/29/2019 Medical Rec #:  [id number]       Height:       72.0 in Accession #:    [id number]      Weight:       195.0 lb Date of Birth:  Oct 08, 1930       BSA:          2.11 m Patient Age:    85 years        BP:           120/63 mmHg Patient Gender: M               HR:           71 bpm. Exam Location:  Inpatient Procedure: 2D Echo        STAT ECHO Reported to: Dr. Mayford Knife. Indications:     stroke  History:         Patient has no prior history of Echocardiogram examinations.                  Signs/Symptoms:elevated troponin.  Sonographer:     Delcie Roch Referring Phys:  [phone] TRACI R TURNER Diagnosing Phys: Armanda Magic MD IMPRESSIONS  1. Left ventricular ejection fraction, by visual estimation, is <20%. The left ventricle has severely decreased function. Left ventricular septal wall thickness was mildly increased. Mildly increased left ventricular posterior wall thickness.  2. There is apical ballooning with akinesis of the entire apical wall. There is akinesis of the mid infero and anteroseptum. There is akinesis of the mid and apical inferior, anterior and lateral walls and akineseis of the apical  inferolateral wall.  3. Moderate, fixed thrombus on the apical wall of the left ventricle.  4. Definity contrast agent was given IV to delineate the left ventricular endocardial borders.  5. Global right ventricle has normal  systolic function.The right ventricular size is normal. No increase in right ventricular wall thickness.  6. Left atrial size was normal.  7. Right atrial size was normal.  8. The mitral valve is normal in structure. Trivial mitral valve regurgitation. No evidence of mitral stenosis.  9. The tricuspid valve is normal in structure. Tricuspid valve regurgitation is mild. 10. The aortic valve is tricuspid. Aortic valve regurgitation is not visualized. No evidence of aortic valve sclerosis or stenosis. 11. The pulmonic valve was normal in structure. Pulmonic valve regurgitation is not visualized. 12. Normal pulmonary artery systolic pressure. 13. The inferior vena cava is normal in size with greater than 50% respiratory variability, suggesting right atrial pressure of 3 mmHg. 14. Left ventricular diastolic parameters are consistent with Grade I diastolic dysfunction (impaired relaxation). FINDINGS  Left Ventricle: Left ventricular ejection fraction, by visual estimation, is <20%. The left ventricle has severely decreased function. Definity contrast agent was given IV to delineate the left ventricular endocardial borders. Mildly increased left ventricular posterior wall thickness. Left ventricular diastolic parameters are consistent with Grade I diastolic dysfunction (impaired relaxation). There is a moderate, fixed, apical left ventricular thrombus. The thrombus appears flat (mural) in shape.  There is apical ballooning with akinesis of the entire apical wall. There is akinesis of the mid infero and anteroseptum. There is akinesis of the mid and apical inferior, anterior and lateral walls and akineseis of the apical inferolateral wall. Right Ventricle: The right ventricular size is normal. No  increase in right ventricular wall thickness. Global RV systolic function is has normal systolic function. The tricuspid regurgitant velocity is 2.47 m/s, and with an assumed right atrial pressure  of 3 mmHg, the estimated right ventricular systolic pressure is normal at 27.4 mmHg. Left Atrium: Left atrial size was normal in size. Right Atrium: Right atrial size was normal in size Pericardium: There is no evidence of pericardial effusion. Mitral Valve: The mitral valve is normal in structure. Trivial mitral valve regurgitation. No evidence of mitral valve stenosis by observation. Tricuspid Valve: The tricuspid valve is normal in structure. Tricuspid valve regurgitation is mild. Aortic Valve: The aortic valve is tricuspid. . There is moderate thickening and moderate calcification of the aortic valve. Aortic valve regurgitation is not visualized. The aortic valve is structurally normal, with no evidence of sclerosis or stenosis. There is moderate thickening of the aortic valve. There is moderate calcification of the aortic valve. Pulmonic Valve: The pulmonic valve was normal in structure. Pulmonic valve regurgitation is not visualized. Pulmonic regurgitation is not visualized. Aorta: The aortic root, ascending aorta and aortic arch are all structurally normal, with no evidence of dilitation or obstruction. Venous: The inferior vena cava is normal in size with greater than 50% respiratory variability, suggesting right atrial pressure of 3 mmHg. IAS/Shunts: No atrial level shunt detected by color flow Doppler. There is no evidence of a patent foramen ovale. No ventricular septal defect is seen or detected. There is no evidence of an atrial septal defect.  LEFT VENTRICLE PLAX 2D LVIDd:         4.20 cm  Diastology LVIDs:         3.00 cm  LV e' lateral: 13.40 cm/s LV PW:         1.20 cm  LV e' medial:  6.20 cm/s LV IVS:        1.20 cm LVOT diam:     1.90 cm LV SV:         44 ml LV SV  Index:   20.45 LVOT Area:     2.84 cm   RIGHT VENTRICLE RV S prime:     13.30 cm/s TAPSE (M-mode): 2.3 cm LEFT ATRIUM             Index       RIGHT ATRIUM           Index LA diam:        3.40 cm 1.61 cm/m  RA Area:     12.10 cm LA Vol (A2C):   56.0 ml 26.57 ml/m RA Volume:   28.80 ml  13.66 ml/m LA Vol (A4C):   33.7 ml 15.99 ml/m LA Biplane Vol: 44.3 ml 21.02 ml/m  AORTIC VALVE LVOT Vmax:   54.80 cm/s LVOT Vmean:  41.400 cm/s LVOT VTI:    0.150 m  AORTA Ao Root diam: 2.80 cm TRICUSPID VALVE TR Peak grad:   24.4 mmHg TR Vmax:        247.00 cm/s  SHUNTS Systemic VTI:  0.15 m Systemic Diam: 1.90 cm  Armanda Magic MD Electronically signed by Armanda Magic MD Signature Date/Time: 01/29/2019/4:31:22 AM    Final (Updated)    IR PERCUTANEOUS ART THROMBECTOMY/INFUSION INTRACRANIAL INC DIAG ANGIO  Result Date: 01/30/2019 INDICATION: New onset of right gaze deviation, and left hemiplegia. 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 EXAM: 1. EMERGENT LARGE VESSEL OCCLUSION THROMBOLYSIS (anterior CIRCULATION) COMPARISON:  CT angiogram of the head and neck of January 28, 2019. MEDICATIONS: Ancef 2 g IV was administered within 1 hour of the procedure. ANESTHESIA/SEDATION: General anesthesia. CONTRAST:  Isovue 300 approximately 110 mL. FLUOROSCOPY TIME:  Fluoroscopy Time: 36 minutes 12 seconds (1466 mGy). COMPLICATIONS: None immediate. TECHNIQUE: Following a full explanation of the procedure along with the potential associated complications, an informed witnessed consent was obtained from the patient in a 3 way phone call. The risks of intracranial hemorrhage of 10%, worsening neurological deficit, ventilator dependency, death and inability to revascularize were all reviewed in detail with the patient's daughter. The patient was then put under general anesthesia by the Department of Anesthesiology at [facility]. The right groin was prepped and  draped in the usual sterile fashion. Thereafter using modified Seldinger technique, transfemoral access into the right common femoral artery was obtained without difficulty. Over a 0.035 inch guidewire a 5 French Pinnacle sheath was inserted. Through this, and also over a 0.035 inch guidewire a 5 [place] JB 1 catheter was advanced to the aortic arch region and selectively positioned in the innominate artery, the right common carotid artery and the left common carotid artery. FINDINGS: The innominate artery angiogram demonstrates the origin of the right subclavian artery and the right common carotid artery to be widely patent. The right vertebral artery at its origin demonstrates wide patency. More distally the vessel is seen to opacify to the cranial skull base. On the left lateral projection, the right vertebrobasilar junction and the opacified portion of the basilar artery and the posterior cerebral arteries demonstrate wide patency with free advancement of contrast into the delayed capillary phase. The right common carotid arteriogram demonstrates the right external carotid artery and its major branches to be widely patent. The right internal carotid artery demonstrated complete angiographic occlusion just distal to the bulb with a flame shape configuration. There is no delayed evidence of string sign. Also no reconstitution of the right internal carotid artery intracranially is seen from the external  carotid artery branches. PROCEDURE: The diagnostic JB 1 catheter in the right common carotid artery was then exchanged over a 0.035 inch 300 cm Rosen exchange guidewire for an 8 [place] Pinnacle sheath in the right groin which was connected to continuous heparinized saline infusion. Over the [facility] exchange guidewire, a 95 cm 087 Walrus balloon guide catheter which had been prepped with 50% contrast and 50% heparinized saline infusion was then advanced and positioned just proximal to the origin of the right internal  carotid artery. The guidewire was removed. Good aspiration obtained from the hub of the Walrus balloon guide catheter. A gentle control arteriogram performed through the balloon guide catheter continued to demonstrate no change in the extracranial or intracranial circulations. Over a 0.014 inch standard Synchro micro guidewire with a J configuration, a 132 cm 6 [place] Catalyst guide catheter inside of which was an [employer] microcatheter was advanced with the micro guidewire leading with the J configuration to the supraclinoid segment. The micro guidewire was then gently manipulated with a torque device to enter the right middle cerebral artery into the distal M1 M2 junction. This was then followed by the microcatheter. The micro guidewire was then gently retrieved and removed. There was free aspiration of blood from the hub of the microcatheter. A gentle control arteriogram performed through this demonstrated excellent flow into the MCA bifurcation branches. A 4 mm x 40 mm Solitaire X retrieval device was then deployed with the distal M1 segment, and the proximal portion in the supraclinoid segment. At this time, the Catalyst guide catheter was advanced into the proximal aspect of the clot. With constant aspiration being applied at the hub of the [facility] guide catheter with a 60 mL syringe, and constant aspiration being applied with a Penumbra aspiration device at the hub of the Catalyst guide catheter, for 2 minutes, the combination of the retrieval device, the microcatheter, the 6 [place] Catalyst guide catheter and eventually the balloon guide catheter was retrieved and removed. Copious amounts of soft clot was seen in [facility], the 6 [place] Catalyst guide catheter, and also the Walrus balloon guide catheter. A diagnostic JB 1 catheter advanced into the right common carotid artery demonstrated mild to moderately improved flow in the right internal carotid proximally. There continued be occlusion in the  more distal cavernous and supraclinoid segments. The combination was advanced again as a mentioned above. The micro guidewire was advanced into the right middle cerebral artery M1 M2 region followed by the microcatheter. The 6 French Catalyst guidewire was advanced into the occluded proximal portion of the clot. The 4 mm x 40 mm retrieval device was again deployed as above. With aspiration being applied at the hub of the 6 [place] Catalyst guide catheter with a Penumbra aspiration device, and a 60 mL syringe at the hub of the balloon the Catalyst guide catheter was retrieved the to the origin of the right internal carotid artery demonstrated guide catheter for about 2-1/2 minutes, the combination was again retrieved and removed. At this time free aspiration was noted at the hub of the [facility] balloon guide catheter in the mid right internal carotid artery. The aspirate demonstrated copious amounts of dark clot with small pieces. A control arteriogram performed through the [facility] guide catheter at the origin of the right internal carotid now demonstrated complete angiographic revascularization of the right internal carotid artery extra cranially and intracranially. The right middle cerebral artery and the right anterior cerebral artery also demonstrated complete angiographic revascularization with no evidence  of intraluminal filling defects or of occlusions. Spasm was noted in the right internal carotid artery and responded to 3 aliquots of 25 mcg of nitroglycerin with elevation of the spasm. No evidence of intimal flap was noted. The balloon guide was then retrieved and removed. The 8 French Pinnacle sheath in the right groin was then replaced with an 8 French Angio-Seal closure device with hemostasis. The right groin appeared soft. Distal pulses remained Dopplerable in the dorsalis pedis, and the posterior tibial arteries bilaterally unchanged. The patient was loaded with 81 mg of aspirin, and 180 mg of Brilinta via  an orogastric tube. Additionally, the patient was given 7.5 mg of intra-arterial Integrilin in order to prevent formation of platelet aggregates. A flat panel CT of the brain demonstrated no evidence of hemorrhage, mass effect, or midline shift. The patient's general anesthesia was then reversed. Upon recovery, the patient was able to obey simple commands with movement of the right upper and right lower extremities. Left over left demonstrated more activity. Patient was then transferred to the neuro ICU to continue with post thrombectomy management. IMPRESSION: Status post endovascular complete revascularization of the right internal carotid artery extra cranially and intracranially and partially the right middle cerebral artery proximally at its origin with 2 passes with the Solitaire X 4 x 40 mm retrieval device, and Penumbra aspiration achieving a TICI 3 revascularization of the right middle cerebral artery, and the right anterior cerebral artery distributions. PLAN: As per referring MD. Electronically Signed   By: Julieanne Cotton M.D.   On: 01/29/2019 10:44   CT HEAD CODE STROKE WO CONTRAST  Result Date: 01/28/2019 CLINICAL DATA:  Code stroke. Stroke. Slurred speech left facial droop EXAM: CT HEAD WITHOUT CONTRAST TECHNIQUE: Contiguous axial images were obtained from the base of the skull through the vertex without intravenous contrast. COMPARISON:  CT head 12/11/2017 FINDINGS: Brain: Moderate atrophy unchanged. Negative for hydrocephalus. Negative for acute cortical infarct, hemorrhage, mass. Hypodensity in the central lower pons could represent artifact versus acute infarct. Vascular: Negative for hyperdense vessel Skull: Negative Sinuses/Orbits: Chronic opacification left maxillary sinus with bony thickening unchanged. Mucosal edema left frontal and ethmoid sinus. Negative orbit. Other: None ASPECTS (Alberta Stroke Program Early CT Score) - Ganglionic level infarction (caudate, lentiform nuclei,  internal capsule, insula, M1-M3 cortex): 7 - Supraganglionic infarction (M4-M6 cortex): 3 Total score (0-10 with 10 being normal): 10 IMPRESSION: 1. No acute cortical infarct or hemorrhage. 2. Hypodensity in the pons could be artifact. 3. ASPECTS is 10 4. These results were called by telephone at the time of interpretation on 01/28/2019 at 9:36 pm to provider [facility] , who verbally acknowledged these results. Electronically Signed   By: Marlan Palau M.D.   On: 01/28/2019 21:37   VAS US CAROTID  Result Date: 01/31/2019 Carotid Arterial Duplex Study Indications:       Post thrombectomy. Comparison Study:  No prior study. Performing Technologist: Gertie Fey MHA, RDMS, RVT, RDCS  Examination Guidelines: A complete evaluation includes B-mode imaging, spectral Doppler, color Doppler, and power Doppler as needed of all accessible portions of each vessel. Bilateral testing is considered an integral part of a complete examination. Limited examinations for reoccurring indications may be performed as noted.  Right Carotid Findings: +----------+--------+--------+--------+-----------------------+--------+           PSV cm/sEDV cm/sStenosisPlaque Description     Comments +----------+--------+--------+--------+-----------------------+--------+ CCA Prox  60      16                                              +----------+--------+--------+--------+-----------------------+--------+  CCA Distal52      12              smooth and heterogenous         +----------+--------+--------+--------+-----------------------+--------+ ICA Prox  49      11              smooth and heterogenous         +----------+--------+--------+--------+-----------------------+--------+ ICA Distal90      28                                              +----------+--------+--------+--------+-----------------------+--------+ ECA       60      11                                               +----------+--------+--------+--------+-----------------------+--------+ +---------+--------+--+--------+--+---------+ VertebralPSV cm/s49EDV cm/s13Antegrade +---------+--------+--+--------+--+---------+  Summary: Right Carotid: Velocities in the right ICA are consistent with a 1-39% stenosis.                This is a limited carotid study and only right side was studied.  *See table(s) above for measurements and observations.  Electronically signed by Delia Heady MD on 01/31/2019 at 12:36:58 PM.   Final    [place] EKG SITE RITE  Result Date: 01/29/2019 If Site Rite image not attached, placement could not be confirmed due to current cardiac rhythm.  IR ANGIO INTRA EXTRACRAN SEL COM CAROTID INNOMINATE UNI L MOD SED  Result Date: 01/30/2019 INDICATION: New onset of right gaze deviation, and left hemiplegia. 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 EXAM: 1. EMERGENT LARGE VESSEL OCCLUSION THROMBOLYSIS (anterior CIRCULATION) COMPARISON:  CT angiogram of the head and neck of January 28, 2019. MEDICATIONS: Ancef 2 g IV was administered within 1 hour of the procedure. ANESTHESIA/SEDATION: General anesthesia. CONTRAST:  Isovue 300 approximately 110 mL. FLUOROSCOPY TIME:  Fluoroscopy Time: 36 minutes 12 seconds (1466 mGy). COMPLICATIONS: None immediate. TECHNIQUE: Following a full explanation of the procedure along with the potential associated complications, an informed witnessed consent was obtained from the patient in a 3 way phone call. The risks of intracranial hemorrhage of 10%, worsening neurological deficit, ventilator dependency, death and inability to revascularize were all reviewed in detail with the patient's daughter. The patient was then put under general anesthesia by the Department of Anesthesiology at [facility]. The right groin was prepped and draped in the usual sterile  fashion. Thereafter using modified Seldinger technique, transfemoral access into the right common femoral artery was obtained without difficulty. Over a 0.035 inch guidewire a 5 French Pinnacle sheath was inserted. Through this, and also over a 0.035 inch guidewire a 5 [place] JB 1 catheter was advanced to the aortic arch region and selectively positioned in the innominate artery, the right common carotid artery and the left common carotid artery. FINDINGS: The innominate artery angiogram demonstrates the origin of the right subclavian artery and the right common carotid artery to be widely patent. The right vertebral artery at its origin demonstrates wide patency. More distally the vessel is seen to opacify to the cranial skull base. On the left lateral projection, the right vertebrobasilar junction and the  opacified portion of the basilar artery and the posterior cerebral arteries demonstrate wide patency with free advancement of contrast into the delayed capillary phase. The right common carotid arteriogram demonstrates the right external carotid artery and its major branches to be widely patent. The right internal carotid artery demonstrated complete angiographic occlusion just distal to the bulb with a flame shape configuration. There is no delayed evidence of string sign. Also no reconstitution of the right internal carotid artery intracranially is seen from the external carotid artery branches. PROCEDURE: The diagnostic JB 1 catheter in the right common carotid artery was then exchanged over a 0.035 inch 300 cm Rosen exchange guidewire for an 8 [place] Pinnacle sheath in the right groin which was connected to continuous heparinized saline infusion. Over the [facility] exchange guidewire, a 95 cm 087 [place] balloon guide catheter which had been prepped with 50% contrast and 50% heparinized saline infusion was then advanced and positioned just proximal to the origin of the right internal carotid artery. The guidewire  was removed. Good aspiration obtained from the hub of the [place] balloon guide catheter. A gentle control arteriogram performed through the balloon guide catheter continued to demonstrate no change in the extracranial or intracranial circulations. Over a 0.014 inch standard Synchro micro guidewire with a J configuration, a 132 cm 6 [place] Catalyst guide catheter inside of which was an [employer] microcatheter was advanced with the micro guidewire leading with the J configuration to the supraclinoid segment. The micro guidewire was then gently manipulated with a torque device to enter the right middle cerebral artery into the distal M1 M2 junction. This was then followed by the microcatheter. The micro guidewire was then gently retrieved and removed. There was free aspiration of blood from the hub of the microcatheter. A gentle control arteriogram performed through this demonstrated excellent flow into the MCA bifurcation branches. A 4 mm x 40 mm Solitaire X retrieval device was then deployed with the distal M1 segment, and the proximal portion in the supraclinoid segment. At this time, the Catalyst guide catheter was advanced into the proximal aspect of the clot. With constant aspiration being applied at the hub of the [facility] guide catheter with a 60 mL syringe, and constant aspiration being applied with a Penumbra aspiration device at the hub of the Catalyst guide catheter, for 2 minutes, the combination of the retrieval device, the microcatheter, the 6 [place] Catalyst guide catheter and eventually the balloon guide catheter was retrieved and removed. Copious amounts of soft clot was seen in Tuohy [place], the 6 [place] Catalyst guide catheter, and also the Walrus balloon guide catheter. A diagnostic JB 1 catheter advanced into the right common carotid artery demonstrated mild to moderately improved flow in the right internal carotid proximally. There continued be occlusion in the more distal cavernous and  supraclinoid segments. The combination was advanced again as a mentioned above. The micro guidewire was advanced into the right middle cerebral artery M1 M2 region followed by the microcatheter. The 6 French Catalyst guidewire was advanced into the occluded proximal portion of the clot. The 4 mm x 40 mm retrieval device was again deployed as above. With aspiration being applied at the hub of the 6 [place] Catalyst guide catheter with a Penumbra aspiration device, and a 60 mL syringe at the hub of the balloon the Catalyst guide catheter was retrieved the to the origin of the right internal carotid artery demonstrated guide catheter for about 2-1/2 minutes, the combination was again retrieved and removed.  At this time free aspiration was noted at the hub of the [facility] balloon guide catheter in the mid right internal carotid artery. The aspirate demonstrated copious amounts of dark clot with small pieces. A control arteriogram performed through the [facility] guide catheter at the origin of the right internal carotid now demonstrated complete angiographic revascularization of the right internal carotid artery extra cranially and intracranially. The right middle cerebral artery and the right anterior cerebral artery also demonstrated complete angiographic revascularization with no evidence of intraluminal filling defects or of occlusions. Spasm was noted in the right internal carotid artery and responded to 3 aliquots of 25 mcg of nitroglycerin with elevation of the spasm. No evidence of intimal flap was noted. The balloon guide was then retrieved and removed. The 8 French Pinnacle sheath in the right groin was then replaced with an 8 French Angio-Seal closure device with hemostasis. The right groin appeared soft. Distal pulses remained Dopplerable in the dorsalis pedis, and the posterior tibial arteries bilaterally unchanged. The patient was loaded with 81 mg of aspirin, and 180 mg of Brilinta via an orogastric tube.  Additionally, the patient was given 7.5 mg of intra-arterial Integrilin in order to prevent formation of platelet aggregates. A flat panel CT of the brain demonstrated no evidence of hemorrhage, mass effect, or midline shift. The patient's general anesthesia was then reversed. Upon recovery, the patient was able to obey simple commands with movement of the right upper and right lower extremities. Left over left demonstrated more activity. Patient was then transferred to the neuro ICU to continue with post thrombectomy management. IMPRESSION: Status post endovascular complete revascularization of the right internal carotid artery extra cranially and intracranially and partially the right middle cerebral artery proximally at its origin with 2 passes with the Solitaire X 4 x 40 mm retrieval device, and Penumbra aspiration achieving a TICI 3 revascularization of the right middle cerebral artery, and the right anterior cerebral artery distributions. PLAN: As per referring MD. Electronically Signed   By: Julieanne Cotton M.D.   On: 01/29/2019 10:44   IR ANGIO VERTEBRAL SEL SUBCLAVIAN INNOMINATE UNI R MOD SED  Result Date: 01/30/2019 INDICATION: New onset of right gaze deviation, and left hemiplegia. 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 EXAM: 1. EMERGENT LARGE VESSEL OCCLUSION THROMBOLYSIS (anterior CIRCULATION) COMPARISON:  CT angiogram of the head and neck of January 28, 2019. MEDICATIONS: Ancef 2 g IV was administered within 1 hour of the procedure. ANESTHESIA/SEDATION: General anesthesia. CONTRAST:  Isovue 300 approximately 110 mL. FLUOROSCOPY TIME:  Fluoroscopy Time: 36 minutes 12 seconds (1466 mGy). COMPLICATIONS: None immediate. TECHNIQUE: Following a full explanation of the procedure along with the potential associated complications, an informed witnessed consent was obtained from the patient  in a 3 way phone call. The risks of intracranial hemorrhage of 10%, worsening neurological deficit, ventilator dependency, death and inability to revascularize were all reviewed in detail with the patient's daughter. The patient was then put under general anesthesia by the Department of Anesthesiology at [facility]. The right groin was prepped and draped in the usual sterile fashion. Thereafter using modified Seldinger technique, transfemoral access into the right common femoral artery was obtained without difficulty. Over a 0.035 inch guidewire a 5 French Pinnacle sheath was inserted. Through this, and also over a 0.035 inch guidewire a 5 [place] JB 1 catheter was advanced to the aortic arch region and selectively positioned  in the innominate artery, the right common carotid artery and the left common carotid artery. FINDINGS: The innominate artery angiogram demonstrates the origin of the right subclavian artery and the right common carotid artery to be widely patent. The right vertebral artery at its origin demonstrates wide patency. More distally the vessel is seen to opacify to the cranial skull base. On the left lateral projection, the right vertebrobasilar junction and the opacified portion of the basilar artery and the posterior cerebral arteries demonstrate wide patency with free advancement of contrast into the delayed capillary phase. The right common carotid arteriogram demonstrates the right external carotid artery and its major branches to be widely patent. The right internal carotid artery demonstrated complete angiographic occlusion just distal to the bulb with a flame shape configuration. There is no delayed evidence of string sign. Also no reconstitution of the right internal carotid artery intracranially is seen from the external carotid artery branches. PROCEDURE: The diagnostic JB 1 catheter in the right common carotid artery was then exchanged over a 0.035 inch 300 cm Rosen exchange  guidewire for an 8 [place] Pinnacle sheath in the right groin which was connected to continuous heparinized saline infusion. Over the [facility] exchange guidewire, a 95 cm 087 [place] balloon guide catheter which had been prepped with 50% contrast and 50% heparinized saline infusion was then advanced and positioned just proximal to the origin of the right internal carotid artery. The guidewire was removed. Good aspiration obtained from the hub of the [place] balloon guide catheter. A gentle control arteriogram performed through the balloon guide catheter continued to demonstrate no change in the extracranial or intracranial circulations. Over a 0.014 inch standard Synchro micro guidewire with a J configuration, a 132 cm 6 [place] Catalyst guide catheter inside of which was an [employer] microcatheter was advanced with the micro guidewire leading with the J configuration to the supraclinoid segment. The micro guidewire was then gently manipulated with a torque device to enter the right middle cerebral artery into the distal M1 M2 junction. This was then followed by the microcatheter. The micro guidewire was then gently retrieved and removed. There was free aspiration of blood from the hub of the microcatheter. A gentle control arteriogram performed through this demonstrated excellent flow into the MCA bifurcation branches. A 4 mm x 40 mm Solitaire X retrieval device was then deployed with the distal M1 segment, and the proximal portion in the supraclinoid segment. At this time, the Catalyst guide catheter was advanced into the proximal aspect of the clot. With constant aspiration being applied at the hub of the [facility] guide catheter with a 60 mL syringe, and constant aspiration being applied with a Penumbra aspiration device at the hub of the Catalyst guide catheter, for 2 minutes, the combination of the retrieval device, the microcatheter, the 6 [place] Catalyst guide catheter and eventually the balloon guide catheter  was retrieved and removed. Copious amounts of soft clot was seen in Tuohy [place], the 6 [place] Catalyst guide catheter, and also the Walrus balloon guide catheter. A diagnostic JB 1 catheter advanced into the right common carotid artery demonstrated mild to moderately improved flow in the right internal carotid proximally. There continued be occlusion in the more distal cavernous and supraclinoid segments. The combination was advanced again as a mentioned above. The micro guidewire was advanced into the right middle cerebral artery M1 M2 region followed by the microcatheter. The 6 French Catalyst guidewire was advanced into the occluded proximal portion of the clot. The  4 mm x 40 mm retrieval device was again deployed as above. With aspiration being applied at the hub of the 6 [place] Catalyst guide catheter with a Penumbra aspiration device, and a 60 mL syringe at the hub of the balloon the Catalyst guide catheter was retrieved the to the origin of the right internal carotid artery demonstrated guide catheter for about 2-1/2 minutes, the combination was again retrieved and removed. At this time free aspiration was noted at the hub of the [facility] balloon guide catheter in the mid right internal carotid artery. The aspirate demonstrated copious amounts of dark clot with small pieces. A control arteriogram performed through the [facility] guide catheter at the origin of the right internal carotid now demonstrated complete angiographic revascularization of the right internal carotid artery extra cranially and intracranially. The right middle cerebral artery and the right anterior cerebral artery also demonstrated complete angiographic revascularization with no evidence of intraluminal filling defects or of occlusions. Spasm was noted in the right internal carotid artery and responded to 3 aliquots of 25 mcg of nitroglycerin with elevation of the spasm. No evidence of intimal flap was noted. The balloon guide was then  retrieved and removed. The 8 French Pinnacle sheath in the right groin was then replaced with an 8 French Angio-Seal closure device with hemostasis. The right groin appeared soft. Distal pulses remained Dopplerable in the dorsalis pedis, and the posterior tibial arteries bilaterally unchanged. The patient was loaded with 81 mg of aspirin, and 180 mg of Brilinta via an orogastric tube. Additionally, the patient was given 7.5 mg of intra-arterial Integrilin in order to prevent formation of platelet aggregates. A flat panel CT of the brain demonstrated no evidence of hemorrhage, mass effect, or midline shift. The patient's general anesthesia was then reversed. Upon recovery, the patient was able to obey simple commands with movement of the right upper and right lower extremities. Left over left demonstrated more activity. Patient was then transferred to the neuro ICU to continue with post thrombectomy management. IMPRESSION: Status post endovascular complete revascularization of the right internal carotid artery extra cranially and intracranially and partially the right middle cerebral artery proximally at its origin with 2 passes with the Solitaire X 4 x 40 mm retrieval device, and Penumbra aspiration achieving a TICI 3 revascularization of the right middle cerebral artery, and the right anterior cerebral artery distributions. PLAN: As per referring MD. Electronically Signed   By: Julieanne Cotton M.D.   On: 01/29/2019 10:44    PHYSICAL EXAM     General - frail elderly caucasian male in mild distress.  Ophthalmologic - fundi not visualized due to noncooperation.  Cardiovascular - Regular rhythm and rate, not in afib.  Neuro- awake alert, orientated to self, age, place and situation, but not to time. Able to name without difficulty, able to repeat simple sentences. Able to follow simple commands. No gaze deviation, no hemianopia. Tracking on both sides, PERRL. Mild left facial droop. Tongue midline.  Left UE 4+/5. LLE proximal 4+/5, distal 5/5 with toe DF and PF. RUE and RLE 5/5. Sensation symmtrical subjectively. FTN intact on the right but mild dysmetria on the left. Gait not tested.   ASSESSMENT/PLAN Mr. Erica Osuna is a 85 y.o. male with history of HLD, HTN, DM presenting after a fall with L sided weakness face, arm and leg.   Stroke: bilateral anterior and posterior infarcts with L ICA occlusion s/p IR w TICI3 revascularization, likely embolic d/t new AF w/ RVR and LV thrombus in  the setting of undiagnosed recent MI  Code Stroke CT head No acute abnormality. pontiue hypodensity. ASPECTS 10.     CTA head & neck R ICA occlusion. Decreased R MCA flow w/ good cross-filling.  CT perfusion 13 mL core infarct R watershed w/ delayed perfusion R MCA d/t R ICA occlusion   Cerebral Angio occlusion intra and extracranial ICA with TICI3 reperfusion    MRI  Scattered small cerebrum and cerebellar bilateral infarcts (greatest R frontoparietal)  MRA  Limited unremarkable  Carotid Doppler  R 1-39% stenosis    2D Echo severe LV dysfunction < 20% w/ possible stress cardiomyopathy w/ apical ballooning and LV thrombus  TG 244, HDL < 10  Direct LDL - 58.5  HgbA1c 8.8  warfarin for VTE prophylaxis -> D/c 12.25  aspirin 81 mg daily prior to admission, now on warfarin started 01/31/19. Goal INR 2-3  Therapy recommendations:  CIR - pt did not qualify - family plans to take pt home with [facility] therapies and home hospice  Disposition:  pending   PAF w/ RVR, new diagnosis  Home anticoagulation:  none   CHA2DS2-VASc Score = at least 6, ?2 oral anticoagulation recommended  Age in Years:  ?42   +2    Sex:  Male   0    Hypertension History:  yes   +1     Diabetes Mellitus:  yes   +1  Congestive Heart Failure History:  0  Vascular Disease History:  yes   +1     Stroke/TIA/Thromboembolism History:  yes   +2 . Started on amiodarone drip w/ bolus due to RVR -> now po amiodarone . on coumadin  with INR goal 2-3 . INR 2.3 on 12/25  Abnormal EKG w/ ST elevation LV Systolic Dysunction, LV thrombus  Felt likely reflective of ICA occlusion w/ demand ischemia  EKG w/ concern for anterior MI  Trop 15519->15715->11893   2D Echo severe LV dysfunction < 20% w/ possible stress cardiomyopathy w/ apical ballooning and LV thrombus  No BB d/t low BP  BNP 4321.1->2,302->2613.1   Probable anterolateral MI prior to admission per cardiology  Cardiology on board  Acute Respiratory failure   CCM signed off 01/31/19  Recurrent episode of respiratory distress  sats to 80s on 2L w/ wheezing 12/25 during the night  Likely d/t volume overload  Treated w/ lasix  Repeat CXR 12/25 w/ some edema, ? PNA  Repeat CXR 12/27 pending  Continue maintenance lasix     Hypotension Hx Hypertension  Home meds:  atenolol 25 daily, lisinopril 5  Low BP felt to be related to AF in setting of severe LV dysfunction  BP goal 110-140  off levophed   Stopped IVF d/t severe LV dysfunction  Hyperlipidemia  Home meds:  pravachol 40  TG 244 and HDL < 10  Direct LDL - 58.5, goal < 70  AST 33, ALT 49 on 02/02/19  On lipitor 40  Continue lipitor on discharge   Diabetes type II Uncontrolled  Home meds:  Metformin 500 bid  HgbA1c 8.8, goal < 7.0  CBGs  SSI  Close PCP follow up   UGIB, resolved - ? Stress ulcer  Coffee ground color emesis - resolved   Off NG suction  PPI IV Q12h  On diet now  Other Stroke Risk Factors  Advanced age  MI, LV thrombus and low EF - coumadin  Other Active Problems  Mild cognitive decline at baseline suspect this is now aggravated by his stroke, heart  failure and sundowning.- agitation early AM 01/31/19. Improved today  Physical deconditioning over past 2 weeks going from independent walking to using a walker. Had ER eval 2 wks ago for fatigue and decreased appetite.   AKI  1.57->1.48->1.76->1.60  Leukocytosis WBC  10.1->10.4->20.8->11.9  Hypokalemia, resolved  Hospital day # 10  I had long discussion with daughter, son and wife at bedside, updated pt current condition, treatment plan and potential prognosis, and answered all the questions. They expressed understanding and appreciation. They requested to take pt home for [facility] PT/OT and consult Home hospice program.   Marvel Plan, MD PhD Stroke Neurology 02/07/2019 10:37 PM  To contact Stroke Continuity provider, please refer to [email or web address]. After hours, contact General Neurology

## 2019-02-07 NOTE — TOC Progression Note (Signed)
Transition of Care [facility]) - Progression Note    Patient Details  Name: Adam Leon MRN: [id number] Date of Birth: [birth year]  Transition of Care [facility] CM/SW [place], [place] Phone Number: 02/07/2019, 9:35 AM  Clinical Narrative:     CSW followed up with Delsa Sale at [facility]. They only have one bed available today and the patient is second on the waiting list. There may possibly be something available this afternoon or tomorrow if anything changes.   CSW will continue to follow and assist with discharge planning.   Expected Discharge Plan: [place] Barriers to Discharge: Continued Medical Work up  Expected Discharge Plan and Services Expected Discharge Plan: [place]     Post Acute Care Choice: Hospice                                         Social Determinants of Health (SDOH) Interventions    Readmission Risk Interventions No flowsheet data found.

## 2019-02-07 NOTE — Progress Notes (Signed)
ANTICOAGULATION CONSULT NOTE  Pharmacy Consult for Heparin/Coumadin Indication: ACS, S/P carotid embolectomy, and LV thrombus   Patient Measurements: Height: 6' (182.9 cm) Weight: 195 lb (88.5 kg) IBW/kg (Calculated) : 77.6 Heparin weight: 88.5 kg  Vital Signs: Temp: 97.8 F (36.6 C) (12/26 0740) Temp Source: Oral (12/26 0740) BP: 88/59 (12/26 0740) Pulse Rate: 76 (12/26 0740)  Labs: Recent Labs    02/05/19 0424 02/05/19 0757 02/05/19 2034 02/06/19 0517  HGB 12.2*  --   --   --   HCT 38.7*  --   --   --   PLT 444*  --   --   --   LABPROT 20.6*  --   --  25.6*  INR 1.8*  --   --  2.3*  HEPARINUNFRC  --  0.82* 0.72* 0.66  CREATININE 1.60*  --   --   --     Estimated Creatinine Clearance: 36.4 mL/min (A) (by C-G formula based on SCr of 1.6 mg/dL (H)).    Assessment: 85 y.o. male with EKG changes, elevated cardiac markers and LV thrombus, S/P CVA and carotid thrombectomy, started on warfarin.   INR 2.3 yesterday - no INR today  Goal of Therapy:  INR 2-3 Monitor platelets by anticoagulation protocol: Yes    Plan:   Warfarin 3 mg x 1  Daily INR  Barth Kirks, PharmD, BCPS, BCCCP Clinical Pharmacist [phone]  Please check AMION for all [place] numbers  02/07/2019 12:02 PM

## 2019-02-07 NOTE — Care Management (Signed)
Spoke w granddaughter who is at bedside. She explained that patient perked up in the last 24 hours and focus has shifted from residential hospice to home hospice. She explains that the family is confused as to what to expect for his disease course. It is limiting their ability to forecast how they will provide care for him at home.  Granddaughter sates that he will need a hospital bed delivered prior to discharge as his room is upstairs and he is essentially bed bound now.  She deferred further info to her mother, the patient's daughter, Brenton Grills. I Spoke w Brenton Grills. She states that would come together as a family and provide support during the day. She could not at this time provide a definite plan for the nighttime hours. Prior to admission patient lived at home w wife. Laureen states that her mom would not be able to roll the patient in bed and provide all of his hygiene needs by herself.  We discussed a palliative consult while inpatient to further help the family ascertain what support the patient wil need, and identify the likely course of his disease so that they could comfortably agree to providing the care needed.  Requested palliative consult from D Rinehuls.

## 2019-02-07 NOTE — Progress Notes (Signed)
Geneticist, molecular contacted pt's daughter to determine whether the goal is for pt to discharge to [facility]. Daughter stated that pt has "perked up" and that they would like to be able to bring him home depending on how the night goes. Writer educated that EOL is often like a Community education officer" and pts may have many ups and downs in their final days. Daughter verbalized understanding.    [facility] is happy to support any decision that the family makes. Liaison to touch base with daughter and [facility] manager tomorrow to determine decision made by family re: [facility] vs home with hospice.   Please do not hesitate to outreach [facility] with any questions.   Thank you,  Freddie Breech, RN [facility] Liaison [phone]

## 2019-02-08 ENCOUNTER — Inpatient Hospital Stay (HOSPITAL_COMMUNITY): Payer: Medicare HMO

## 2019-02-08 DIAGNOSIS — I63411 Cerebral infarction due to embolism of right middle cerebral artery: Secondary | ICD-10-CM

## 2019-02-08 LAB — CBC
HCT: 38.6 % — ABNORMAL LOW (ref 39.0–52.0)
Hemoglobin: 12.2 g/dL — ABNORMAL LOW (ref 13.0–17.0)
MCH: 27.5 pg (ref 26.0–34.0)
MCHC: 31.6 g/dL (ref 30.0–36.0)
MCV: 86.9 fL (ref 80.0–100.0)
Platelets: 433 10*3/uL — ABNORMAL HIGH (ref 150–400)
RBC: 4.44 MIL/uL (ref 4.22–5.81)
RDW: 14.6 % (ref 11.5–15.5)
WBC: 10.9 10*3/uL — ABNORMAL HIGH (ref 4.0–10.5)
nRBC: 0 % (ref 0.0–0.2)

## 2019-02-08 LAB — COMPREHENSIVE METABOLIC PANEL
ALT: 30 U/L (ref 0–44)
AST: 31 U/L (ref 15–41)
Albumin: 2.4 g/dL — ABNORMAL LOW (ref 3.5–5.0)
Alkaline Phosphatase: 145 U/L — ABNORMAL HIGH (ref 38–126)
Anion gap: 10 (ref 5–15)
BUN: 29 mg/dL — ABNORMAL HIGH (ref 8–23)
CO2: 31 mmol/L (ref 22–32)
Calcium: 8.3 mg/dL — ABNORMAL LOW (ref 8.9–10.3)
Chloride: 96 mmol/L — ABNORMAL LOW (ref 98–111)
Creatinine, Ser: 1.53 mg/dL — ABNORMAL HIGH (ref 0.61–1.24)
GFR calc Af Amer: 47 mL/min — ABNORMAL LOW (ref >60)
GFR calc non Af Amer: 41 mL/min — ABNORMAL LOW (ref >60)
Glucose, Bld: 191 mg/dL — ABNORMAL HIGH (ref 70–99)
Potassium: 3.8 mmol/L (ref 3.5–5.1)
Sodium: 137 mmol/L (ref 135–145)
Total Bilirubin: 0.6 mg/dL (ref 0.3–1.2)
Total Protein: 5.7 g/dL — ABNORMAL LOW (ref 6.5–8.1)

## 2019-02-08 LAB — PROTIME-INR
INR: 3.2 — ABNORMAL HIGH (ref 0.8–1.2)
Prothrombin Time: 32.5 seconds — ABNORMAL HIGH (ref 11.4–15.2)

## 2019-02-08 MED ORDER — WARFARIN SODIUM 1 MG PO TABS
1.5000 mg | ORAL_TABLET | Freq: Once | ORAL | Status: AC
  Administered 2019-02-08: 1.5 mg via ORAL
  Filled 2019-02-08: qty 1

## 2019-02-08 MED ORDER — BISACODYL 10 MG RE SUPP
10.0000 mg | Freq: Once | RECTAL | Status: AC
  Administered 2019-02-08: 10 mg via RECTAL
  Filled 2019-02-08: qty 1

## 2019-02-08 MED ORDER — FUROSEMIDE 20 MG PO TABS
20.0000 mg | ORAL_TABLET | Freq: Every day | ORAL | Status: DC
  Administered 2019-02-09: 20 mg via ORAL
  Filled 2019-02-08 (×2): qty 1

## 2019-02-08 NOTE — Progress Notes (Signed)
[facility] does not have bed availability for pt today.   RN called pt's daughter, Brenton Grills, who stated that they do still wish to have pt placed at [facility] for EOL care because they do not feel that they can handle pt at home. [facility] will keep pt on waiting list for [facility].   Please do not hesitate to outreach [facility] with any questions.   Thank you,  Freddie Breech, RN [facility] Liaison [phone]

## 2019-02-08 NOTE — Progress Notes (Signed)
[facility] Documentation  Liaison received update that pt will no longer require [facility] and plans to discharge to SNF for rehab. Pt's family wishes to involve [facility] Palliative program.   Liaison will sent palliative dept referral so that palliative team can follow up with pt s/p discharge at SNF selected.   Please call [facility] with any questions and thank you for the referral to our palliative program.   Freddie Breech, RN [facility] Liaison [phone]

## 2019-02-08 NOTE — NC FL2 (Signed)
[place] MEDICAID FL2 LEVEL OF CARE SCREENING TOOL     IDENTIFICATION  Patient Name: Adam Leon Birthdate: 02-21-1930 Sex: male Admission Date (Current Location): 01/28/2019  [facility] and [place] Number:  Producer, television/film/video and Address:  [facility], [address] [place], [place] [address]      Provider Number: [id number]  Attending Physician Name and Address:  Marvel Plan, MD  Relative Name and Phone Number:  Tami Ribas, Daughter, [phone]    Current Level of Care: Hospital Recommended Level of Care: Skilled Nursing Facility Prior Approval Number:    Date Approved/Denied: 02/08/19 PASRR Number: [id number]  Discharge Plan: SNF    Current Diagnoses: Patient Active Problem List   Diagnosis Date Noted  . Acute anterior wall MI (HCC)   . Apical mural thrombus   . Acute CVA (cerebrovascular accident) (HCC)   . Dyslipidemia   . Essential hypertension   . Syncope   . New onset atrial fibrillation (HCC)   . Coronary artery disease involving native coronary artery of native heart without angina pectoris   . Uncontrolled type 2 diabetes mellitus with hyperglycemia (HCC)   . Leukocytosis   . 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 AKI (acute kidney injury) (HCC)   . Hypotension due to hypovolemia   . Troponin level elevated   . Acute ischemic stroke (HCC) 01/28/2019    Orientation RESPIRATION BLADDER Height & Weight     Self, Place, Situation  Normal, Other (Comment)(room air, wears bipap at night (full face mask, size L), set rate 12, resp rate 22, 02% 50, sp02 100) Incontinent, External catheter Weight: 195 lb (88.5 kg) Height:  6' (182.9 cm)  BEHAVIORAL SYMPTOMS/MOOD NEUROLOGICAL BOWEL NUTRITION STATUS      Continent Diet(Dys 2 diet, thin liquids)  AMBULATORY STATUS COMMUNICATION OF NEEDS Skin   Limited Assist Verbally Bruising, Surgical wounds, Other (Comment)(bruising on arm/leg/wrist; incision closed on  groin)                       Personal Care Assistance Level of Assistance  Bathing, Feeding, Dressing Bathing Assistance: Limited assistance Feeding assistance: Limited assistance Dressing Assistance: Limited assistance     Functional Limitations Info  Sight, Hearing, Speech Sight Info: Adequate Hearing Info: Impaired Speech Info: Adequate    SPECIAL CARE FACTORS FREQUENCY  PT (By licensed PT), OT (By licensed OT), Speech therapy     PT Frequency: 5x/wk OT Frequency: 5x/wk     Speech Therapy Frequency: 2x/wk      Contractures Contractures Info: Not present    Additional Factors Info  Code Status, Allergies, Psychotropic Code Status Info: DNR Allergies Info: No Known Allergies Psychotropic Info: seroquel 12.5mg  daily at bedtime         Current Medications (02/08/2019):  This is the current hospital active medication list Current Facility-Administered Medications  Medication Dose Route Frequency Provider Last Rate Last Admin  . acetaminophen (TYLENOL) tablet 650 mg  650 mg Oral Q4H PRN Milon Dikes, MD       Or  . acetaminophen (TYLENOL) 160 MG/5ML solution 650 mg  650 mg Per Tube Q4H PRN Milon Dikes, MD       Or  . acetaminophen (TYLENOL) suppository 650 mg  650 mg Rectal Q4H PRN Milon Dikes, MD      . albuterol (PROVENTIL) (2.5 MG/3ML) 0.083% nebulizer solution 2.5 mg  2.5 mg Nebulization Q4H PRN Caryl Pina, MD   2.5 mg at 02/06/19 0422  .  amiodarone (PACERONE) tablet 200 mg  200 mg Oral Daily Rosalin Hawking, MD   200 mg at 02/08/19 0916  . aspirin EC tablet 81 mg  81 mg Oral Daily Rosalin Hawking, MD   81 mg at 02/08/19 0916  . atorvastatin (LIPITOR) tablet 40 mg  40 mg Oral q1800 Rosalin Hawking, MD   40 mg at 02/07/19 1738  . bisacodyl (DULCOLAX) suppository 10 mg  10 mg Rectal Once Rosalin Hawking, MD      . chlorhexidine (PERIDEX) 0.12 % solution 15 mL  15 mL Mouth Rinse BID Amie Portland, MD   15 mL at 02/08/19 0917  . Chlorhexidine Gluconate Cloth 2 % PADS 6  each  6 each Topical Daily Rosalin Hawking, MD      . feeding supplement (ENSURE ENLIVE) (ENSURE ENLIVE) liquid 237 mL  237 mL Oral BID BM Garvin Fila, MD   237 mL at 02/08/19 1316  . furosemide (LASIX) tablet 20 mg  20 mg Oral Daily Rosalin Hawking, MD      . ipratropium-albuterol (DUONEB) 0.5-2.5 (3) MG/3ML nebulizer solution 3 mL  3 mL Nebulization Once Kerney Elbe, MD   Stopped at 02/04/19 0003  . LORazepam (ATIVAN) tablet 1 mg  1 mg Oral Q4H PRN Donzetta Starch, NP   1 mg at 02/08/19 1410  . MEDLINE mouth rinse  15 mL Mouth Rinse q12n4p Amie Portland, MD   15 mL at 02/08/19 1100  . polyethylene glycol (MIRALAX / GLYCOLAX) packet 17 g  17 g Oral Daily PRN Reino Bellis B, NP   17 g at 02/02/19 2200  . QUEtiapine (SEROQUEL) tablet 12.5 mg  12.5 mg Oral QHS Garvin Fila, MD   12.5 mg at 02/07/19 2118  . sodium chloride flush (NS) 0.9 % injection 10-40 mL  10-40 mL Intracatheter Q12H Rosalin Hawking, MD   10 mL at 02/08/19 0917  . sodium chloride flush (NS) 0.9 % injection 10-40 mL  10-40 mL Intracatheter PRN Rosalin Hawking, MD      . warfarin (COUMADIN) tablet 1.5 mg  1.5 mg Oral ONCE-1800 Wynell Balloon, RPH      . Warfarin - Pharmacist Dosing Inpatient   Does not apply [id number] Rosalin Hawking, MD         Discharge Medications: Please see discharge summary for a list of discharge medications.  Relevant Imaging Results:  Relevant Lab Results:   Additional Information SSN: [id number]; COVID negative on 01/28/2019  Venisha Boehning B Yury Schaus, LCSWA

## 2019-02-08 NOTE — Progress Notes (Signed)
STROKE TEAM PROGRESS NOTE   INTERVAL HISTORY Patient sitting in chair, denies any CP, SOB, breathing difficulty. He is on coumadin and INR today 3.2. Hb stable, no bleeding. Cre 1.53 also improving. Discussed with daughter and granddaughter at bedside. Given his condition much improved, we recommend SNF at this time as family can not take care of him at home this time. Will inform SW.   IMAGES CT Code Stroke CTA Head W/WO contrast  Result Date: 01/28/2019 CLINICAL DATA:  Stroke.  Slurred speech.  Left facial droop. EXAM: CT ANGIOGRAPHY HEAD AND NECK CT PERFUSION BRAIN TECHNIQUE: Multidetector CT imaging of the head and neck was performed using the standard protocol during bolus administration of intravenous contrast. Multiplanar CT image reconstructions and MIPs were obtained to evaluate the vascular anatomy. Carotid stenosis measurements (when applicable) are obtained utilizing NASCET criteria, using the distal internal carotid diameter as the denominator. Multiphase CT imaging of the brain was performed following IV bolus contrast injection. Subsequent parametric perfusion maps were calculated using RAPID software. CONTRAST:  OMNIPAQUE IOHEXOL 350 MG/ML SOLN COMPARISON:  CT head 01/28/2019 FINDINGS: CTA NECK FINDINGS Aortic arch: Proximal great vessels widely patent. Incomplete imaging of the aortic arch. Right carotid system: Right common carotid artery widely patent. 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 Right internal carotid artery remains occluded through the cavernous segment. Reconstitution of the supraclinoid internal carotid artery on the right. Left carotid system: Left carotid bifurcation widely patent without significant stenosis. Vertebral arteries: Both vertebral arteries are patent to the basilar without significant stenosis. Skeleton: No acute skeletal abnormality.  Poor dentition. Other neck: Thyroid goiter.  No mass lesion. Upper chest: Mild pleural thickening along  the major fissure on the right. Otherwise lungs clear. Review of the MIP images confirms the above findings CTA HEAD FINDINGS Anterior circulation: Right internal carotid artery is occluded through the cavernous segment with reconstitution of the supraclinoid segment. Both anterior and middle cerebral arteries are patent without significant stenosis or thrombus. Right anterior and middle cerebral arteries supplied through the anterior communicating artery. No significant posterior communicating artery identified on the right. Left cavernous carotid widely patent. Posterior circulation: Both vertebral arteries patent to the basilar. PICA patent. Basilar widely patent. AICA, superior cerebellar, posterior cerebral arteries patent bilaterally. Venous sinuses: Minimal venous contrast due to arterial phase scanning Anatomic variants: None Review of the MIP images confirms the above findings CT Brain Perfusion Findings: ASPECTS: 10 CBF (<30%) Volume: 13mL Perfusion (Tmax>6.0s) volume: Mismatch Volume: Infarction Location:Right MCA territory shows diffuse delayed perfusion. Core infarct in the right watershed territory in the deep white matter. IMPRESSION: 1. 13 mL core infarct in the watershed territory on the right. Large area of delayed perfusion right MCA territory due to occlusion of the right internal carotid artery. Right anterior middle cerebral arteries are patent and supplied through the anterior communicating artery. Collateral circulation is most likely the reason for delayed perfusion in the right MCA territory. 2. No other significant intracranial stenosis. 3. These results were called by telephone at the time of interpretation on 01/28/2019 at 9:54 pm to provider [facility] , who verbally acknowledged these results. Electronically Signed   By: Marlan Palau M.D.   On: 01/28/2019 21:56   CT Code Stroke CTA Neck W/WO contrast  Result Date: 01/28/2019 CLINICAL DATA:  Stroke.  Slurred speech.   Left facial droop. EXAM: CT ANGIOGRAPHY HEAD AND NECK CT PERFUSION BRAIN TECHNIQUE: Multidetector CT imaging of the head and neck was performed using  the standard protocol during bolus administration of intravenous contrast. Multiplanar CT image reconstructions and MIPs were obtained to evaluate the vascular anatomy. Carotid stenosis measurements (when applicable) are obtained utilizing NASCET criteria, using the distal internal carotid diameter as the denominator. Multiphase CT imaging of the brain was performed following IV bolus contrast injection. Subsequent parametric perfusion maps were calculated using RAPID software. CONTRAST:  OMNIPAQUE IOHEXOL 350 MG/ML SOLN COMPARISON:  CT head 01/28/2019 FINDINGS: CTA NECK FINDINGS Aortic arch: Proximal great vessels widely patent. Incomplete imaging of the aortic arch. Right carotid system: Right common carotid artery widely patent. 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 Right internal carotid artery remains occluded through the cavernous segment. Reconstitution of the supraclinoid internal carotid artery on the right. Left carotid system: Left carotid bifurcation widely patent without significant stenosis. Vertebral arteries: Both vertebral arteries are patent to the basilar without significant stenosis. Skeleton: No acute skeletal abnormality.  Poor dentition. Other neck: Thyroid goiter.  No mass lesion. Upper chest: Mild pleural thickening along the major fissure on the right. Otherwise lungs clear. Review of the MIP images confirms the above findings CTA HEAD FINDINGS Anterior circulation: Right internal carotid artery is occluded through the cavernous segment with reconstitution of the supraclinoid segment. Both anterior and middle cerebral arteries are patent without significant stenosis or thrombus. Right anterior and middle cerebral arteries supplied through the anterior communicating artery. No significant posterior communicating artery  identified on the right. Left cavernous carotid widely patent. Posterior circulation: Both vertebral arteries patent to the basilar. PICA patent. Basilar widely patent. AICA, superior cerebellar, posterior cerebral arteries patent bilaterally. Venous sinuses: Minimal venous contrast due to arterial phase scanning Anatomic variants: None Review of the MIP images confirms the above findings CT Brain Perfusion Findings: ASPECTS: 10 CBF (<30%) Volume: 13mL Perfusion (Tmax>6.0s) volume: Mismatch Volume: Infarction Location:Right MCA territory shows diffuse delayed perfusion. Core infarct in the right watershed territory in the deep white matter. IMPRESSION: 1. 13 mL core infarct in the watershed territory on the right. Large area of delayed perfusion right MCA territory due to occlusion of the right internal carotid artery. Right anterior middle cerebral arteries are patent and supplied through the anterior communicating artery. Collateral circulation is most likely the reason for delayed perfusion in the right MCA territory. 2. No other significant intracranial stenosis. 3. These results were called by telephone at the time of interpretation on 01/28/2019 at 9:54 pm to provider [facility] , who verbally acknowledged these results. Electronically Signed   By: Marlan Palau M.D.   On: 01/28/2019 21:56   MR MRA HEAD WO CONTRAST  Result Date: 01/29/2019 CLINICAL DATA:  Stroke follow-up. Left-sided weakness. Status post endovascular revascularization of occluded right ICA. EXAM: MRI HEAD WITHOUT CONTRAST MRA HEAD WITHOUT CONTRAST TECHNIQUE: Multiplanar, multiecho pulse sequences of the brain and surrounding structures were obtained without intravenous contrast. Angiographic images of the head were obtained using MRA technique without contrast. COMPARISON:  Head CT, CTA, and CTP 01/28/2019 FINDINGS: MRI HEAD FINDINGS The study is motion degraded throughout with some sequences being severely degraded  (including nondiagnostic susceptibility weighted imaging). Brain: There are scattered small acute to early subacute infarcts in the right cerebral hemisphere most notably involving frontoparietal cortex in the perirolandic region. Small infarcts are also present more inferiorly in the right parietal lobe and in the right globus pallidus, and there is a single punctate subcortical infarct in the left parietal lobe. There are small acute to early subacute bilateral cerebellar infarcts as  well. Motion artifact limits assessment for intracranial hemorrhage. There is no midline shift or sizable extra-axial fluid collection. No age advanced chronic white matter disease is evident. Cerebral atrophy is not greater than expected for age. Vascular: Major intracranial vascular flow voids are grossly preserved. Skull and upper cervical spine: No gross marrow lesion. Sinuses/Orbits: Unremarkable orbits. Left maxillary sinusitis. Left anterior ethmoid air cell opacification. No significant mastoid fluid. Other: None. MRA HEAD FINDINGS The study is intermittently severely motion degraded limiting detailed evaluation. The visualized distal vertebral arteries are patent to the basilar and codominant. Cerebellar arteries are not well seen due to motion. The basilar artery is patent with focal severe motion artifact through its midportion and no evidence of significant stenosis elsewhere. Both PCAs are patent proximally. The included distal cervical and intracranial portions of both internal carotid arteries are patent with assessment for stenosis limited by motion. A1 and M1 segments are patent bilaterally with limited assessment of the branch vessels. IMPRESSION: 1. Severely motion degraded examination. 2. Scattered small acute to early subacute infarcts in the cerebrum and cerebellum bilaterally, greatest in the right frontoparietal region. 3. Limited head MRA due to motion with patency of the large intracranial arteries as above.  Electronically Signed   By: Logan Bores M.D.   On: 01/29/2019 14:04   MR BRAIN WO CONTRAST  Result Date: 01/29/2019 CLINICAL DATA:  Stroke follow-up. Left-sided weakness. Status post endovascular revascularization of occluded right ICA. EXAM: MRI HEAD WITHOUT CONTRAST MRA HEAD WITHOUT CONTRAST TECHNIQUE: Multiplanar, multiecho pulse sequences of the brain and surrounding structures were obtained without intravenous contrast. Angiographic images of the head were obtained using MRA technique without contrast. COMPARISON:  Head CT, CTA, and CTP 01/28/2019 FINDINGS: MRI HEAD FINDINGS The study is motion degraded throughout with some sequences being severely degraded (including nondiagnostic susceptibility weighted imaging). Brain: There are scattered small acute to early subacute infarcts in the right cerebral hemisphere most notably involving frontoparietal cortex in the perirolandic region. Small infarcts are also present more inferiorly in the right parietal lobe and in the right globus pallidus, and there is a single punctate subcortical infarct in the left parietal lobe. There are small acute to early subacute bilateral cerebellar infarcts as well. Motion artifact limits assessment for intracranial hemorrhage. There is no midline shift or sizable extra-axial fluid collection. No age advanced chronic white matter disease is evident. Cerebral atrophy is not greater than expected for age. Vascular: Major intracranial vascular flow voids are grossly preserved. Skull and upper cervical spine: No gross marrow lesion. Sinuses/Orbits: Unremarkable orbits. Left maxillary sinusitis. Left anterior ethmoid air cell opacification. No significant mastoid fluid. Other: None. MRA HEAD FINDINGS The study is intermittently severely motion degraded limiting detailed evaluation. The visualized distal vertebral arteries are patent to the basilar and codominant. Cerebellar arteries are not well seen due to motion. The basilar  artery is patent with focal severe motion artifact through its midportion and no evidence of significant stenosis elsewhere. Both PCAs are patent proximally. The included distal cervical and intracranial portions of both internal carotid arteries are patent with assessment for stenosis limited by motion. A1 and M1 segments are patent bilaterally with limited assessment of the branch vessels. IMPRESSION: 1. Severely motion degraded examination. 2. Scattered small acute to early subacute infarcts in the cerebrum and cerebellum bilaterally, greatest in the right frontoparietal region. 3. Limited head MRA due to motion with patency of the large intracranial arteries as above. Electronically Signed   By: Seymour Bars.D.  On: 01/29/2019 14:04   IR CT Head Ltd  Result Date: 01/30/2019 INDICATION: New onset of right gaze deviation, and left hemiplegia. 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 EXAM: 1. EMERGENT LARGE VESSEL OCCLUSION THROMBOLYSIS (anterior CIRCULATION) COMPARISON:  CT angiogram of the head and neck of January 28, 2019. MEDICATIONS: Ancef 2 g IV was administered within 1 hour of the procedure. ANESTHESIA/SEDATION: General anesthesia. CONTRAST:  Isovue 300 approximately 110 mL. FLUOROSCOPY TIME:  Fluoroscopy Time: 36 minutes 12 seconds (1466 mGy). COMPLICATIONS: None immediate. TECHNIQUE: Following a full explanation of the procedure along with the potential associated complications, an informed witnessed consent was obtained from the patient in a 3 way phone call. The risks of intracranial hemorrhage of 10%, worsening neurological deficit, ventilator dependency, death and inability to revascularize were all reviewed in detail with the patient's daughter. The patient was then put under general anesthesia by the Department of Anesthesiology at [facility]. The right groin was prepped  and draped in the usual sterile fashion. Thereafter using modified Seldinger technique, transfemoral access into the right common femoral artery was obtained without difficulty. Over a 0.035 inch guidewire a 5 French Pinnacle sheath was inserted. Through this, and also over a 0.035 inch guidewire a 5 [place] JB 1 catheter was advanced to the aortic arch region and selectively positioned in the innominate artery, the right common carotid artery and the left common carotid artery. FINDINGS: The innominate artery angiogram demonstrates the origin of the right subclavian artery and the right common carotid artery to be widely patent. The right vertebral artery at its origin demonstrates wide patency. More distally the vessel is seen to opacify to the cranial skull base. On the left lateral projection, the right vertebrobasilar junction and the opacified portion of the basilar artery and the posterior cerebral arteries demonstrate wide patency with free advancement of contrast into the delayed capillary phase. The right common carotid arteriogram demonstrates the right external carotid artery and its major branches to be widely patent. The right internal carotid artery demonstrated complete angiographic occlusion just distal to the bulb with a flame shape configuration. There is no delayed evidence of string sign. Also no reconstitution of the right internal carotid artery intracranially is seen from the external carotid artery branches. PROCEDURE: The diagnostic JB 1 catheter in the right common carotid artery was then exchanged over a 0.035 inch 300 cm Rosen exchange guidewire for an 8 [place] Pinnacle sheath in the right groin which was connected to continuous heparinized saline infusion. Over the [facility] exchange guidewire, a 95 cm 087 [place] balloon guide catheter which had been prepped with 50% contrast and 50% heparinized saline infusion was then advanced and positioned just proximal to the origin of the right  internal carotid artery. The guidewire was removed. Good aspiration obtained from the hub of the [place] balloon guide catheter. A gentle control arteriogram performed through the balloon guide catheter continued to demonstrate no change in the extracranial or intracranial circulations. Over a 0.014 inch standard Synchro micro guidewire with a J configuration, a 132 cm 6 [place] Catalyst guide catheter inside of which was an [employer] microcatheter was advanced with the micro guidewire leading with the J configuration to the supraclinoid segment. The micro guidewire was then gently manipulated with a torque device to enter the right middle cerebral artery into the distal M1 M2 junction. This was then followed by the microcatheter.  The micro guidewire was then gently retrieved and removed. There was free aspiration of blood from the hub of the microcatheter. A gentle control arteriogram performed through this demonstrated excellent flow into the MCA bifurcation branches. A 4 mm x 40 mm Solitaire X retrieval device was then deployed with the distal M1 segment, and the proximal portion in the supraclinoid segment. At this time, the Catalyst guide catheter was advanced into the proximal aspect of the clot. With constant aspiration being applied at the hub of the [facility] guide catheter with a 60 mL syringe, and constant aspiration being applied with a Penumbra aspiration device at the hub of the Catalyst guide catheter, for 2 minutes, the combination of the retrieval device, the microcatheter, the 6 [place] Catalyst guide catheter and eventually the balloon guide catheter was retrieved and removed. Copious amounts of soft clot was seen in [place], the 6 [place] Catalyst guide catheter, and also the Walrus balloon guide catheter. A diagnostic JB 1 catheter advanced into the right common carotid artery demonstrated mild to moderately improved flow in the right internal carotid proximally. There continued be occlusion  in the more distal cavernous and supraclinoid segments. The combination was advanced again as a mentioned above. The micro guidewire was advanced into the right middle cerebral artery M1 M2 region followed by the microcatheter. The 6 French Catalyst guidewire was advanced into the occluded proximal portion of the clot. The 4 mm x 40 mm retrieval device was again deployed as above. With aspiration being applied at the hub of the 6 [place] Catalyst guide catheter with a Penumbra aspiration device, and a 60 mL syringe at the hub of the balloon the Catalyst guide catheter was retrieved the to the origin of the right internal carotid artery demonstrated guide catheter for about 2-1/2 minutes, the combination was again retrieved and removed. At this time free aspiration was noted at the hub of the [facility] balloon guide catheter in the mid right internal carotid artery. The aspirate demonstrated copious amounts of dark clot with small pieces. A control arteriogram performed through the [facility] guide catheter at the origin of the right internal carotid now demonstrated complete angiographic revascularization of the right internal carotid artery extra cranially and intracranially. The right middle cerebral artery and the right anterior cerebral artery also demonstrated complete angiographic revascularization with no evidence of intraluminal filling defects or of occlusions. Spasm was noted in the right internal carotid artery and responded to 3 aliquots of 25 mcg of nitroglycerin with elevation of the spasm. No evidence of intimal flap was noted. The balloon guide was then retrieved and removed. The 8 French Pinnacle sheath in the right groin was then replaced with an 8 French Angio-Seal closure device with hemostasis. The right groin appeared soft. Distal pulses remained Dopplerable in the dorsalis pedis, and the posterior tibial arteries bilaterally unchanged. The patient was loaded with 81 mg of aspirin, and 180 mg of  Brilinta via an orogastric tube. Additionally, the patient was given 7.5 mg of intra-arterial Integrilin in order to prevent formation of platelet aggregates. A flat panel CT of the brain demonstrated no evidence of hemorrhage, mass effect, or midline shift. The patient's general anesthesia was then reversed. Upon recovery, the patient was able to obey simple commands with movement of the right upper and right lower extremities. Left over left demonstrated more activity. Patient was then transferred to the neuro ICU to continue with post thrombectomy management. IMPRESSION: Status post endovascular complete revascularization of the right internal carotid  artery extra cranially and intracranially and partially the right middle cerebral artery proximally at its origin with 2 passes with the Solitaire X 4 x 40 mm retrieval device, and Penumbra aspiration achieving a TICI 3 revascularization of the right middle cerebral artery, and the right anterior cerebral artery distributions. PLAN: As per referring MD. Electronically Signed   By: Julieanne Cotton M.D.   On: 01/29/2019 10:44   CT C-SPINE NO CHARGE  Result Date: 01/28/2019 CLINICAL DATA:  Trauma.  Rule out spine fracture EXAM: CT CERVICAL SPINE WITHOUT CONTRAST TECHNIQUE: Multidetector CT imaging of the cervical spine was performed without intravenous contrast. Multiplanar CT image reconstructions were also generated. COMPARISON:  None. FINDINGS: Alignment: Normal Skull base and vertebrae: Negative for fracture Soft tissues and spinal canal: Goiter with enlargement of the right lobe of the thyroid. No adenopathy in the neck. Disc levels: Mild disc degeneration and spurring C5-6 with foraminal narrowing bilaterally. Upper chest: Mild thickening of the major fissure on the right. Otherwise lung apices clear. Other: None IMPRESSION: Negative for cervical spine fracture. Electronically Signed   By: Marlan Palau M.D.   On: 01/28/2019 21:59   CT Code Stroke  Cerebral Perfusion with contrast  Result Date: 01/28/2019 CLINICAL DATA:  Stroke.  Slurred speech.  Left facial droop. EXAM: CT ANGIOGRAPHY HEAD AND NECK CT PERFUSION BRAIN TECHNIQUE: Multidetector CT imaging of the head and neck was performed using the standard protocol during bolus administration of intravenous contrast. Multiplanar CT image reconstructions and MIPs were obtained to evaluate the vascular anatomy. Carotid stenosis measurements (when applicable) are obtained utilizing NASCET criteria, using the distal internal carotid diameter as the denominator. Multiphase CT imaging of the brain was performed following IV bolus contrast injection. Subsequent parametric perfusion maps were calculated using RAPID software. CONTRAST:  OMNIPAQUE IOHEXOL 350 MG/ML SOLN COMPARISON:  CT head 01/28/2019 FINDINGS: CTA NECK FINDINGS Aortic arch: Proximal great vessels widely patent. Incomplete imaging of the aortic arch. Right carotid system: Right common carotid artery widely patent. 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 Right internal carotid artery remains occluded through the cavernous segment. Reconstitution of the supraclinoid internal carotid artery on the right. Left carotid system: Left carotid bifurcation widely patent without significant stenosis. Vertebral arteries: Both vertebral arteries are patent to the basilar without significant stenosis. Skeleton: No acute skeletal abnormality.  Poor dentition. Other neck: Thyroid goiter.  No mass lesion. Upper chest: Mild pleural thickening along the major fissure on the right. Otherwise lungs clear. Review of the MIP images confirms the above findings CTA HEAD FINDINGS Anterior circulation: Right internal carotid artery is occluded through the cavernous segment with reconstitution of the supraclinoid segment. Both anterior and middle cerebral arteries are patent without significant stenosis or thrombus. Right anterior and middle cerebral  arteries supplied through the anterior communicating artery. No significant posterior communicating artery identified on the right. Left cavernous carotid widely patent. Posterior circulation: Both vertebral arteries patent to the basilar. PICA patent. Basilar widely patent. AICA, superior cerebellar, posterior cerebral arteries patent bilaterally. Venous sinuses: Minimal venous contrast due to arterial phase scanning Anatomic variants: None Review of the MIP images confirms the above findings CT Brain Perfusion Findings: ASPECTS: 10 CBF (<30%) Volume: 13mL Perfusion (Tmax>6.0s) volume: Mismatch Volume: Infarction Location:Right MCA territory shows diffuse delayed perfusion. Core infarct in the right watershed territory in the deep white matter. IMPRESSION: 1. 13 mL core infarct in the watershed territory on the right. Large area of delayed perfusion right MCA territory due to  occlusion of the right internal carotid artery. Right anterior middle cerebral arteries are patent and supplied through the anterior communicating artery. Collateral circulation is most likely the reason for delayed perfusion in the right MCA territory. 2. No other significant intracranial stenosis. 3. These results were called by telephone at the time of interpretation on 01/28/2019 at 9:54 pm to provider [facility] , who verbally acknowledged these results. Electronically Signed   By: Marlan Palau M.D.   On: 01/28/2019 21:56   DG CHEST PORT 1 VIEW  Result Date: 02/06/2019 CLINICAL DATA:  Shortness of breath, wheezing EXAM: PORTABLE CHEST 1 VIEW COMPARISON:  Radiograph 02/04/2019 FINDINGS: Similar appearance of the bilateral interstitial and airspace disease throughout both lungs. No visible pneumothorax or effusion. Increasing septal thickening and fissural thickening. Cardiomediastinal contours are stable. The osseous structures appear diffusely demineralized which may limit detection of small or nondisplaced fractures.  No acute osseous or soft tissue abnormality. Support devices and cardiac monitoring leads overlie the chest. IMPRESSION: Similar appearance of the bilateral interstitial and airspace disease throughout both lungs, which could reflect edema or multifocal pneumonia. Favor at least some component of edema given the increasing fissural thickening Electronically Signed   By: Kreg Shropshire M.D.   On: 02/06/2019 02:00   DG CHEST PORT 1 VIEW  Result Date: 02/04/2019 CLINICAL DATA:  Increased shortness of breath EXAM: PORTABLE CHEST 1 VIEW COMPARISON:  January 29, 2019 FINDINGS: The heart size and mediastinal contours are unchanged. There is patchy/fluffy airspace opacities seen predominantly within the perihilar region with increased interstitial markings throughout both lungs. No pleural effusion is seen. No acute osseous abnormality. IMPRESSION: Interval development of patchy/fluffy perihilar airspace opacities. This could be due to asymmetric pulmonary edema and/or infectious etiology. Electronically Signed   By: Jonna Clark M.D.   On: 02/04/2019 00:35   DG CHEST PORT 1 VIEW  Result Date: 01/29/2019 CLINICAL DATA:  Stroke due to embolism EXAM: PORTABLE CHEST 1 VIEW COMPARISON:  01/15/2019 FINDINGS: Interstitial coarsening with Charyl Dancer lines. No visible effusion or pneumothorax. Normal heart size and stable mediastinal contours. Enteric tube tip and side-port reaches the stomach. IMPRESSION: Interstitial pulmonary edema. Electronically Signed   By: Marnee Spring M.D.   On: 01/29/2019 05:08   DG Chest Port 1 View  Result Date: 01/15/2019 CLINICAL DATA:  86 year old male with fever. EXAM: PORTABLE CHEST 1 VIEW COMPARISON:  None. FINDINGS: Background of mild emphysema. No focal consolidation, pleural effusion, or pneumothorax. The cardiac silhouette is within normal limits. No acute osseous pathology. IMPRESSION: No active disease. Electronically Signed   By: Elgie Collard M.D.   On: 01/15/2019 18:17    DG Abd Portable 1V  Result Date: 01/29/2019 CLINICAL DATA:  OG tube placement. EXAM: PORTABLE ABDOMEN - 1 VIEW COMPARISON:  None. FINDINGS: Tip of the enteric tube is below the diaphragm in the stomach, the side port is in the region of the gastroesophageal junction. Recommend advancement of least 3 cm for optimal placement. Excreted IV contrast in both renal collecting systems and the urinary bladder from prior IV contrast. Lobular contour of the bladder suggests bladder diverticula or chronic bladder outlet obstruction no bowel obstruction. Surgical clips in the right upper quadrant likely from cholecystectomy. IMPRESSION: Tip of the enteric tube below the diaphragm in the stomach, the side-port in the region of the gastroesophageal junction. Recommend advancement of least 3 cm for optimal placement. Electronically Signed   By: Narda Rutherford M.D.   On: 01/29/2019 03:27   DG Swallowing Func-Speech  Pathology  Result Date: 02/02/2019 Objective Swallowing Evaluation: Type of Study: MBS-Modified Barium Swallow Study  Patient Details Name: Adam Leon MRN: [id number] Date of Birth: 08-19-1930 Today's Date: 02/02/2019 Time: SLP Start Time (ACUTE ONLY): 1220 -SLP Stop Time (ACUTE ONLY): 1245 SLP Time Calculation (min) (ACUTE ONLY): 25 min Past Medical History: Past Medical History: Diagnosis Date . Hypercholesteremia  . Syncope  . Syncope  . Ventricular hypertrophy  Past Surgical History: Past Surgical History: Procedure Laterality Date . IR ANGIO INTRA EXTRACRAN SEL COM CAROTID INNOMINATE UNI L MOD SED  01/29/2019 . IR ANGIO VERTEBRAL SEL SUBCLAVIAN INNOMINATE UNI R MOD SED  01/29/2019 . IR CT HEAD LTD  01/29/2019 . IR PERCUTANEOUS ART THROMBECTOMY/INFUSION INTRACRANIAL INC DIAG ANGIO  01/29/2019 HPI: Adam Leon is a 85 y.o. male who has a PMH including but not limited to syncope, HLD.  He presented to [facility] ED 12/16 with left hemiplegia.  CTA showed R ICA occlusion.  He was subsequently taken to IR  for arteriogram with complete revascularization of occluded extracranial and intracranial R ICA. CXR 12/17: interstitial edema.  CT 12/16: R MCA infarct in deep white matter  Subjective: Pt seen in radiology for MBS. Assessment / Plan / Recommendation CHL IP CLINICAL IMPRESSIONS 02/02/2019 Clinical Impression Pt seen in radiology for MBS to objectively assess swallow function and safety, based on clinical presentation at bedside. Pt was uprght in chair, awake and alert. He is hard of hearing. Pt was given trials of nectar thick liquid, thin liquid, puree, and solid textures. He was also given a barium tablet in water.  Orally, pt exhibited oral holding and piecemeal swallow, with premature spillage to the vallecular sinus. Pharyngeal swallow is characterized by trigger of swallow reflex at the vallecular sinus across consistencies. Very trace subepiglottic penetration was noted during the swallow of thin and nectar thick liquids, however, penetrate cleared completely and spontaeously and was not aspirated. Mild lateral channel and vallecular residue was noted after the swallow on most consistencies, which cleared with second/dry swallow. Barium tablet cleared the oral cavity without delay, but was noted to pause in the vallecular sinus before clearing with second swallow. Esophageal sweep revealed it to be clear. Recommend continuing with Dys 2 solids and thin liquids, meds either whole or crushed in puree (based on pt mentation, which is likely to vary due to dx dementia). Recommend assistance with feeding as needed, again based on pt mentation. Safe swallow precautions were reviewed with RN, and sent in written form with transport to be placed at [facility]. SLP will follow up to assess diet tolerance and continue education.  SLP Visit Diagnosis Dysphagia, oropharyngeal phase (R13.12)     Impact on safety and function Mild aspiration risk;Moderate aspiration risk   CHL IP TREATMENT RECOMMENDATION 02/02/2019 Treatment  Recommendations Therapy as outlined in treatment plan below   Prognosis 02/02/2019 Prognosis for Safe Diet Advancement Fair Barriers to Reach Goals Cognitive deficits   CHL IP DIET RECOMMENDATION 02/02/2019 SLP Diet Recommendations Dysphagia 2 (Fine chop) solids;Thin liquid Liquid Administration via Cup;Straw Medication Administration Other (Comment) Compensations Minimize environmental distractions;Slow rate;Small sips/bites;Multiple dry swallows after each bite/sip Postural Changes Remain semi-upright after after feeds/meals (Comment);Seated upright at 90 degrees   CHL IP OTHER RECOMMENDATIONS 02/02/2019   Oral Care Recommendations Oral care QID     CHL IP FOLLOW UP RECOMMENDATIONS 01/30/2019 Follow up Recommendations Inpatient Rehab   CHL IP FREQUENCY AND DURATION 02/02/2019 Speech Therapy Frequency (ACUTE ONLY) min 2x/week Treatment Duration 2 weeks  CHL IP ORAL PHASE 02/02/2019 Oral Phase Impaired   Oral - Nectar Cup Holding of bolus;Delayed oral transit;Premature spillage   Oral - Thin Cup/Straw Piecemeal swallowing;Premature spillage   Oral - Puree Holding of bolus;Piecemeal swallowing;Premature spillage   Oral - Regular Piecemeal swallowing;Nasal reflux;Premature spillage   Oral - Pill WFL    CHL IP PHARYNGEAL PHASE 02/02/2019 Pharyngeal Phase Impaired   Pharyngeal- Nectar Cup Delayed swallow initiation-vallecula;Reduced airway/laryngeal closure;Penetration/Aspiration during swallow;Pharyngeal residue - valleculae Pharyngeal Material does not enter airway;Material enters airway, remains ABOVE vocal cords then ejected out   Pharyngeal- Thin Cup Delayed swallow initiation-vallecula;Reduced airway/laryngeal closure;Penetration/Apiration after swallow;Pharyngeal residue - valleculae;Pharyngeal residue - pyriform;Lateral channel residue Pharyngeal Material does not enter airway;Material enters airway, remains ABOVE vocal cords then ejected out   Pharyngeal- Puree Delayed swallow initiation-vallecula    Pharyngeal- Regular Delayed swallow initiation-vallecula;Pharyngeal residue - valleculae   Pharyngeal- Pill Delayed swallow initiation-vallecula Pill paused in the vallecular sinus briefly before clearing the pharynx      CHL IP CERVICAL ESOPHAGEAL PHASE 02/02/2019 Cervical Esophageal Phase [facility] Adam Leon, MSP, CCC-SLP Speech Language Pathologist Office: [phone] Pager: [phone] Leigh Aurora 02/02/2019, 1:52 PM              ECHOCARDIOGRAM COMPLETE  Result Date: 01/29/2019   ECHOCARDIOGRAM REPORT   Patient Name:   Adam Leon Date of Exam: 01/29/2019 Medical Rec #:  [id number]       Height:       72.0 in Accession #:    [id number]      Weight:       195.0 lb Date of Birth:  03/29/1930       BSA:          2.11 m Patient Age:    85 years        BP:           120/63 mmHg Patient Gender: M               HR:           71 bpm. Exam Location:  Inpatient Procedure: 2D Echo        STAT ECHO Reported to: Dr. Mayford Knife. Indications:     stroke  History:         Patient has no prior history of Echocardiogram examinations.                  Signs/Symptoms:elevated troponin.  Sonographer:     Delcie Roch Referring Phys:  [phone] TRACI R TURNER Diagnosing Phys: Armanda Magic MD IMPRESSIONS  1. Left ventricular ejection fraction, by visual estimation, is <20%. The left ventricle has severely decreased function. Left ventricular septal wall thickness was mildly increased. Mildly increased left ventricular posterior wall thickness.  2. There is apical ballooning with akinesis of the entire apical wall. There is akinesis of the mid infero and anteroseptum. There is akinesis of the mid and apical inferior, anterior and lateral walls and akineseis of the apical inferolateral wall.  3. Moderate, fixed thrombus on the apical wall of the left ventricle.  4. Definity contrast agent was given IV to delineate the left ventricular endocardial borders.  5. Global right ventricle has normal systolic function.The right  ventricular size is normal. No increase in right ventricular wall thickness.  6. Left atrial size was normal.  7. Right atrial size was normal.  8. The mitral valve is normal in structure. Trivial mitral valve regurgitation. No evidence of mitral stenosis.  9. The tricuspid valve is  normal in structure. Tricuspid valve regurgitation is mild. 10. The aortic valve is tricuspid. Aortic valve regurgitation is not visualized. No evidence of aortic valve sclerosis or stenosis. 11. The pulmonic valve was normal in structure. Pulmonic valve regurgitation is not visualized. 12. Normal pulmonary artery systolic pressure. 13. The inferior vena cava is normal in size with greater than 50% respiratory variability, suggesting right atrial pressure of 3 mmHg. 14. Left ventricular diastolic parameters are consistent with Grade I diastolic dysfunction (impaired relaxation). FINDINGS  Left Ventricle: Left ventricular ejection fraction, by visual estimation, is <20%. The left ventricle has severely decreased function. Definity contrast agent was given IV to delineate the left ventricular endocardial borders. Mildly increased left ventricular posterior wall thickness. Left ventricular diastolic parameters are consistent with Grade I diastolic dysfunction (impaired relaxation). There is a moderate, fixed, apical left ventricular thrombus. The thrombus appears flat (mural) in shape.  There is apical ballooning with akinesis of the entire apical wall. There is akinesis of the mid infero and anteroseptum. There is akinesis of the mid and apical inferior, anterior and lateral walls and akineseis of the apical inferolateral wall. Right Ventricle: The right ventricular size is normal. No increase in right ventricular wall thickness. Global RV systolic function is has normal systolic function. The tricuspid regurgitant velocity is 2.47 m/s, and with an assumed right atrial pressure  of 3 mmHg, the estimated right ventricular systolic pressure  is normal at 27.4 mmHg. Left Atrium: Left atrial size was normal in size. Right Atrium: Right atrial size was normal in size Pericardium: There is no evidence of pericardial effusion. Mitral Valve: The mitral valve is normal in structure. Trivial mitral valve regurgitation. No evidence of mitral valve stenosis by observation. Tricuspid Valve: The tricuspid valve is normal in structure. Tricuspid valve regurgitation is mild. Aortic Valve: The aortic valve is tricuspid. . There is moderate thickening and moderate calcification of the aortic valve. Aortic valve regurgitation is not visualized. The aortic valve is structurally normal, with no evidence of sclerosis or stenosis. There is moderate thickening of the aortic valve. There is moderate calcification of the aortic valve. Pulmonic Valve: The pulmonic valve was normal in structure. Pulmonic valve regurgitation is not visualized. Pulmonic regurgitation is not visualized. Aorta: The aortic root, ascending aorta and aortic arch are all structurally normal, with no evidence of dilitation or obstruction. Venous: The inferior vena cava is normal in size with greater than 50% respiratory variability, suggesting right atrial pressure of 3 mmHg. IAS/Shunts: No atrial level shunt detected by color flow Doppler. There is no evidence of a patent foramen ovale. No ventricular septal defect is seen or detected. There is no evidence of an atrial septal defect.  LEFT VENTRICLE PLAX 2D LVIDd:         4.20 cm  Diastology LVIDs:         3.00 cm  LV e' lateral: 13.40 cm/s LV PW:         1.20 cm  LV e' medial:  6.20 cm/s LV IVS:        1.20 cm LVOT diam:     1.90 cm LV SV:         44 ml LV SV Index:   20.45 LVOT Area:     2.84 cm  RIGHT VENTRICLE RV S prime:     13.30 cm/s TAPSE (M-mode): 2.3 cm LEFT ATRIUM             Index       RIGHT ATRIUM  Index LA diam:        3.40 cm 1.61 cm/m  RA Area:     12.10 cm LA Vol (A2C):   56.0 ml 26.57 ml/m RA Volume:   28.80 ml  13.66  ml/m LA Vol (A4C):   33.7 ml 15.99 ml/m LA Biplane Vol: 44.3 ml 21.02 ml/m  AORTIC VALVE LVOT Vmax:   54.80 cm/s LVOT Vmean:  41.400 cm/s LVOT VTI:    0.150 m  AORTA Ao Root diam: 2.80 cm TRICUSPID VALVE TR Peak grad:   24.4 mmHg TR Vmax:        247.00 cm/s  SHUNTS Systemic VTI:  0.15 m Systemic Diam: 1.90 cm  Armanda Magic MD Electronically signed by Armanda Magic MD Signature Date/Time: 01/29/2019/4:31:22 AM    Final (Updated)    IR PERCUTANEOUS ART THROMBECTOMY/INFUSION INTRACRANIAL INC DIAG ANGIO  Result Date: 01/30/2019 INDICATION: New onset of right gaze deviation, and left hemiplegia. 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 EXAM: 1. EMERGENT LARGE VESSEL OCCLUSION THROMBOLYSIS (anterior CIRCULATION) COMPARISON:  CT angiogram of the head and neck of January 28, 2019. MEDICATIONS: Ancef 2 g IV was administered within 1 hour of the procedure. ANESTHESIA/SEDATION: General anesthesia. CONTRAST:  Isovue 300 approximately 110 mL. FLUOROSCOPY TIME:  Fluoroscopy Time: 36 minutes 12 seconds (1466 mGy). COMPLICATIONS: None immediate. TECHNIQUE: Following a full explanation of the procedure along with the potential associated complications, an informed witnessed consent was obtained from the patient in a 3 way phone call. The risks of intracranial hemorrhage of 10%, worsening neurological deficit, ventilator dependency, death and inability to revascularize were all reviewed in detail with the patient's daughter. The patient was then put under general anesthesia by the Department of Anesthesiology at [facility]. The right groin was prepped and draped in the usual sterile fashion. Thereafter using modified Seldinger technique, transfemoral access into the right common femoral artery was obtained without difficulty. Over a 0.035 inch guidewire a 5 French Pinnacle sheath was inserted. Through  this, and also over a 0.035 inch guidewire a 5 [place] JB 1 catheter was advanced to the aortic arch region and selectively positioned in the innominate artery, the right common carotid artery and the left common carotid artery. FINDINGS: The innominate artery angiogram demonstrates the origin of the right subclavian artery and the right common carotid artery to be widely patent. The right vertebral artery at its origin demonstrates wide patency. More distally the vessel is seen to opacify to the cranial skull base. On the left lateral projection, the right vertebrobasilar junction and the opacified portion of the basilar artery and the posterior cerebral arteries demonstrate wide patency with free advancement of contrast into the delayed capillary phase. The right common carotid arteriogram demonstrates the right external carotid artery and its major branches to be widely patent. The right internal carotid artery demonstrated complete angiographic occlusion just distal to the bulb with a flame shape configuration. There is no delayed evidence of string sign. Also no reconstitution of the right internal carotid artery intracranially is seen from the external carotid artery branches. PROCEDURE: The diagnostic JB 1 catheter in the right common carotid artery was then exchanged over a 0.035 inch 300 cm Rosen exchange guidewire for an 8 [place] Pinnacle sheath in the right groin which was connected to continuous heparinized saline infusion. Over the [facility] exchange guidewire, a 95 cm 087 Wal[place] balloon guide catheter which had been  prepped with 50% contrast and 50% heparinized saline infusion was then advanced and positioned just proximal to the origin of the right internal carotid artery. The guidewire was removed. Good aspiration obtained from the hub of the Walrus balloon guide catheter. A gentle control arteriogram performed through the balloon guide catheter continued to demonstrate no change in the extracranial or  intracranial circulations. Over a 0.014 inch standard Synchro micro guidewire with a J configuration, a 132 cm 6 [place] Catalyst guide catheter inside of which was an [employer] microcatheter was advanced with the micro guidewire leading with the J configuration to the supraclinoid segment. The micro guidewire was then gently manipulated with a torque device to enter the right middle cerebral artery into the distal M1 M2 junction. This was then followed by the microcatheter. The micro guidewire was then gently retrieved and removed. There was free aspiration of blood from the hub of the microcatheter. A gentle control arteriogram performed through this demonstrated excellent flow into the MCA bifurcation branches. A 4 mm x 40 mm Solitaire X retrieval device was then deployed with the distal M1 segment, and the proximal portion in the supraclinoid segment. At this time, the Catalyst guide catheter was advanced into the proximal aspect of the clot. With constant aspiration being applied at the hub of the [facility] guide catheter with a 60 mL syringe, and constant aspiration being applied with a Penumbra aspiration device at the hub of the Catalyst guide catheter, for 2 minutes, the combination of the retrieval device, the microcatheter, the 6 [place] Catalyst guide catheter and eventually the balloon guide catheter was retrieved and removed. Copious amounts of soft clot was seen in Tuohy Bridger, the 6 [place] Catalyst guide catheter, and also the Walrus balloon guide catheter. A diagnostic JB 1 catheter advanced into the right common carotid artery demonstrated mild to moderately improved flow in the right internal carotid proximally. There continued be occlusion in the more distal cavernous and supraclinoid segments. The combination was advanced again as a mentioned above. The micro guidewire was advanced into the right middle cerebral artery M1 M2 region followed by the microcatheter. The 6 French Catalyst guidewire  was advanced into the occluded proximal portion of the clot. The 4 mm x 40 mm retrieval device was again deployed as above. With aspiration being applied at the hub of the 6 [place] Catalyst guide catheter with a Penumbra aspiration device, and a 60 mL syringe at the hub of the balloon the Catalyst guide catheter was retrieved the to the origin of the right internal carotid artery demonstrated guide catheter for about 2-1/2 minutes, the combination was again retrieved and removed. At this time free aspiration was noted at the hub of the [facility] balloon guide catheter in the mid right internal carotid artery. The aspirate demonstrated copious amounts of dark clot with small pieces. A control arteriogram performed through the [facility] guide catheter at the origin of the right internal carotid now demonstrated complete angiographic revascularization of the right internal carotid artery extra cranially and intracranially. The right middle cerebral artery and the right anterior cerebral artery also demonstrated complete angiographic revascularization with no evidence of intraluminal filling defects or of occlusions. Spasm was noted in the right internal carotid artery and responded to 3 aliquots of 25 mcg of nitroglycerin with elevation of the spasm. No evidence of intimal flap was noted. The balloon guide was then retrieved and removed. The 8 French Pinnacle sheath in the right groin was then replaced with an 8  French Angio-Seal closure device with hemostasis. The right groin appeared soft. Distal pulses remained Dopplerable in the dorsalis pedis, and the posterior tibial arteries bilaterally unchanged. The patient was loaded with 81 mg of aspirin, and 180 mg of Brilinta via an orogastric tube. Additionally, the patient was given 7.5 mg of intra-arterial Integrilin in order to prevent formation of platelet aggregates. A flat panel CT of the brain demonstrated no evidence of hemorrhage, mass effect, or midline shift. The  patient's general anesthesia was then reversed. Upon recovery, the patient was able to obey simple commands with movement of the right upper and right lower extremities. Left over left demonstrated more activity. Patient was then transferred to the neuro ICU to continue with post thrombectomy management. IMPRESSION: Status post endovascular complete revascularization of the right internal carotid artery extra cranially and intracranially and partially the right middle cerebral artery proximally at its origin with 2 passes with the Solitaire X 4 x 40 mm retrieval device, and Penumbra aspiration achieving a TICI 3 revascularization of the right middle cerebral artery, and the right anterior cerebral artery distributions. PLAN: As per referring MD. Electronically Signed   By: Julieanne Cotton M.D.   On: 01/29/2019 10:44   CT HEAD CODE STROKE WO CONTRAST  Result Date: 01/28/2019 CLINICAL DATA:  Code stroke. Stroke. Slurred speech left facial droop EXAM: CT HEAD WITHOUT CONTRAST TECHNIQUE: Contiguous axial images were obtained from the base of the skull through the vertex without intravenous contrast. COMPARISON:  CT head 12/11/2017 FINDINGS: Brain: Moderate atrophy unchanged. Negative for hydrocephalus. Negative for acute cortical infarct, hemorrhage, mass. Hypodensity in the central lower pons could represent artifact versus acute infarct. Vascular: Negative for hyperdense vessel Skull: Negative Sinuses/Orbits: Chronic opacification left maxillary sinus with bony thickening unchanged. Mucosal edema left frontal and ethmoid sinus. Negative orbit. Other: None ASPECTS (Alberta Stroke Program Early CT Score) - Ganglionic level infarction (caudate, lentiform nuclei, internal capsule, insula, M1-M3 cortex): 7 - Supraganglionic infarction (M4-M6 cortex): 3 Total score (0-10 with 10 being normal): 10 IMPRESSION: 1. No acute cortical infarct or hemorrhage. 2. Hypodensity in the pons could be artifact. 3. ASPECTS is 10 4.  These results were called by telephone at the time of interpretation on 01/28/2019 at 9:36 pm to provider [facility] , who verbally acknowledged these results. Electronically Signed   By: Marlan Palau M.D.   On: 01/28/2019 21:37   VAS US CAROTID  Result Date: 01/31/2019 Carotid Arterial Duplex Study Indications:       Post thrombectomy. Comparison Study:  No prior study. Performing Technologist: Gertie Fey MHA, RDMS, RVT, RDCS  Examination Guidelines: A complete evaluation includes B-mode imaging, spectral Doppler, color Doppler, and power Doppler as needed of all accessible portions of each vessel. Bilateral testing is considered an integral part of a complete examination. Limited examinations for reoccurring indications may be performed as noted.  Right Carotid Findings: +----------+--------+--------+--------+-----------------------+--------+           PSV cm/sEDV cm/sStenosisPlaque Description     Comments +----------+--------+--------+--------+-----------------------+--------+ CCA Prox  60      16                                              +----------+--------+--------+--------+-----------------------+--------+ CCA Distal52      12              smooth and heterogenous         +----------+--------+--------+--------+-----------------------+--------+  ICA Prox  49      11              smooth and heterogenous         +----------+--------+--------+--------+-----------------------+--------+ ICA Distal90      28                                              +----------+--------+--------+--------+-----------------------+--------+ ECA       60      11                                              +----------+--------+--------+--------+-----------------------+--------+ +---------+--------+--+--------+--+---------+ VertebralPSV cm/s49EDV cm/s13Antegrade +---------+--------+--+--------+--+---------+  Summary: Right Carotid: Velocities in the right ICA are  consistent with a 1-39% stenosis.                This is a limited carotid study and only right side was studied.  *See table(s) above for measurements and observations.  Electronically signed by Delia Heady MD on 01/31/2019 at 12:36:58 PM.   Final    [place] EKG SITE RITE  Result Date: 01/29/2019 If Site Rite image not attached, placement could not be confirmed due to current cardiac rhythm.  IR ANGIO INTRA EXTRACRAN SEL COM CAROTID INNOMINATE UNI L MOD SED  Result Date: 01/30/2019 INDICATION: New onset of right gaze deviation, and left hemiplegia. 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 EXAM: 1. EMERGENT LARGE VESSEL OCCLUSION THROMBOLYSIS (anterior CIRCULATION) COMPARISON:  CT angiogram of the head and neck of January 28, 2019. MEDICATIONS: Ancef 2 g IV was administered within 1 hour of the procedure. ANESTHESIA/SEDATION: General anesthesia. CONTRAST:  Isovue 300 approximately 110 mL. FLUOROSCOPY TIME:  Fluoroscopy Time: 36 minutes 12 seconds (1466 mGy). COMPLICATIONS: None immediate. TECHNIQUE: Following a full explanation of the procedure along with the potential associated complications, an informed witnessed consent was obtained from the patient in a 3 way phone call. The risks of intracranial hemorrhage of 10%, worsening neurological deficit, ventilator dependency, death and inability to revascularize were all reviewed in detail with the patient's daughter. The patient was then put under general anesthesia by the Department of Anesthesiology at [facility]. The right groin was prepped and draped in the usual sterile fashion. Thereafter using modified Seldinger technique, transfemoral access into the right common femoral artery was obtained without difficulty. Over a 0.035 inch guidewire a 5 French Pinnacle sheath was inserted. Through this, and also over a 0.035 inch guidewire a 5  [place] JB 1 catheter was advanced to the aortic arch region and selectively positioned in the innominate artery, the right common carotid artery and the left common carotid artery. FINDINGS: The innominate artery angiogram demonstrates the origin of the right subclavian artery and the right common carotid artery to be widely patent. The right vertebral artery at its origin demonstrates wide patency. More distally the vessel is seen to opacify to the cranial skull base. On the left lateral projection, the right vertebrobasilar junction and the opacified portion of the basilar artery and the posterior cerebral arteries demonstrate wide patency with free advancement of contrast into the delayed capillary phase. The right common carotid arteriogram demonstrates the right external  carotid artery and its major branches to be widely patent. The right internal carotid artery demonstrated complete angiographic occlusion just distal to the bulb with a flame shape configuration. There is no delayed evidence of string sign. Also no reconstitution of the right internal carotid artery intracranially is seen from the external carotid artery branches. PROCEDURE: The diagnostic JB 1 catheter in the right common carotid artery was then exchanged over a 0.035 inch 300 cm Rosen exchange guidewire for an 8 [place] Pinnacle sheath in the right groin which was connected to continuous heparinized saline infusion. Over the [facility] exchange guidewire, a 95 cm 087 Wal[place] balloon guide catheter which had been prepped with 50% contrast and 50% heparinized saline infusion was then advanced and positioned just proximal to the origin of the right internal carotid artery. The guidewire was removed. Good aspiration obtained from the hub of the Wal[place] balloon guide catheter. A gentle control arteriogram performed through the balloon guide catheter continued to demonstrate no change in the extracranial or intracranial circulations. Over a 0.014 inch  standard Synchro micro guidewire with a J configuration, a 132 cm 6 [place] Catalyst guide catheter inside of which was an [employer] microcatheter was advanced with the micro guidewire leading with the J configuration to the supraclinoid segment. The micro guidewire was then gently manipulated with a torque device to enter the right middle cerebral artery into the distal M1 M2 junction. This was then followed by the microcatheter. The micro guidewire was then gently retrieved and removed. There was free aspiration of blood from the hub of the microcatheter. A gentle control arteriogram performed through this demonstrated excellent flow into the MCA bifurcation branches. A 4 mm x 40 mm Solitaire X retrieval device was then deployed with the distal M1 segment, and the proximal portion in the supraclinoid segment. At this time, the Catalyst guide catheter was advanced into the proximal aspect of the clot. With constant aspiration being applied at the hub of the [facility] guide catheter with a 60 mL syringe, and constant aspiration being applied with a Penumbra aspiration device at the hub of the Catalyst guide catheter, for 2 minutes, the combination of the retrieval device, the microcatheter, the 6 [place] Catalyst guide catheter and eventually the balloon guide catheter was retrieved and removed. Copious amounts of soft clot was seen in [place], the 6 [place] Catalyst guide catheter, and also the Walrus balloon guide catheter. A diagnostic JB 1 catheter advanced into the right common carotid artery demonstrated mild to moderately improved flow in the right internal carotid proximally. There continued be occlusion in the more distal cavernous and supraclinoid segments. The combination was advanced again as a mentioned above. The micro guidewire was advanced into the right middle cerebral artery M1 M2 region followed by the microcatheter. The 6 French Catalyst guidewire was advanced into the occluded proximal  portion of the clot. The 4 mm x 40 mm retrieval device was again deployed as above. With aspiration being applied at the hub of the 6 [place] Catalyst guide catheter with a Penumbra aspiration device, and a 60 mL syringe at the hub of the balloon the Catalyst guide catheter was retrieved the to the origin of the right internal carotid artery demonstrated guide catheter for about 2-1/2 minutes, the combination was again retrieved and removed. At this time free aspiration was noted at the hub of the [facility] balloon guide catheter in the mid right internal carotid artery. The aspirate demonstrated copious amounts of dark clot with small  pieces. A control arteriogram performed through the [facility] guide catheter at the origin of the right internal carotid now demonstrated complete angiographic revascularization of the right internal carotid artery extra cranially and intracranially. The right middle cerebral artery and the right anterior cerebral artery also demonstrated complete angiographic revascularization with no evidence of intraluminal filling defects or of occlusions. Spasm was noted in the right internal carotid artery and responded to 3 aliquots of 25 mcg of nitroglycerin with elevation of the spasm. No evidence of intimal flap was noted. The balloon guide was then retrieved and removed. The 8 French Pinnacle sheath in the right groin was then replaced with an 8 French Angio-Seal closure device with hemostasis. The right groin appeared soft. Distal pulses remained Dopplerable in the dorsalis pedis, and the posterior tibial arteries bilaterally unchanged. The patient was loaded with 81 mg of aspirin, and 180 mg of Brilinta via an orogastric tube. Additionally, the patient was given 7.5 mg of intra-arterial Integrilin in order to prevent formation of platelet aggregates. A flat panel CT of the brain demonstrated no evidence of hemorrhage, mass effect, or midline shift. The patient's general anesthesia was then  reversed. Upon recovery, the patient was able to obey simple commands with movement of the right upper and right lower extremities. Left over left demonstrated more activity. Patient was then transferred to the neuro ICU to continue with post thrombectomy management. IMPRESSION: Status post endovascular complete revascularization of the right internal carotid artery extra cranially and intracranially and partially the right middle cerebral artery proximally at its origin with 2 passes with the Solitaire X 4 x 40 mm retrieval device, and Penumbra aspiration achieving a TICI 3 revascularization of the right middle cerebral artery, and the right anterior cerebral artery distributions. PLAN: As per referring MD. Electronically Signed   By: Julieanne Cotton M.D.   On: 01/29/2019 10:44   IR ANGIO VERTEBRAL SEL SUBCLAVIAN INNOMINATE UNI R MOD SED  Result Date: 01/30/2019 INDICATION: New onset of right gaze deviation, and left hemiplegia. 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 EXAM: 1. EMERGENT LARGE VESSEL OCCLUSION THROMBOLYSIS (anterior CIRCULATION) COMPARISON:  CT angiogram of the head and neck of January 28, 2019. MEDICATIONS: Ancef 2 g IV was administered within 1 hour of the procedure. ANESTHESIA/SEDATION: General anesthesia. CONTRAST:  Isovue 300 approximately 110 mL. FLUOROSCOPY TIME:  Fluoroscopy Time: 36 minutes 12 seconds (1466 mGy). COMPLICATIONS: None immediate. TECHNIQUE: Following a full explanation of the procedure along with the potential associated complications, an informed witnessed consent was obtained from the patient in a 3 way phone call. The risks of intracranial hemorrhage of 10%, worsening neurological deficit, ventilator dependency, death and inability to revascularize were all reviewed in detail with the patient's daughter. The patient was then put under general anesthesia  by the Department of Anesthesiology at [facility]. The right groin was prepped and draped in the usual sterile fashion. Thereafter using modified Seldinger technique, transfemoral access into the right common femoral artery was obtained without difficulty. Over a 0.035 inch guidewire a 5 French Pinnacle sheath was inserted. Through this, and also over a 0.035 inch guidewire a 5 [place] JB 1 catheter was advanced to the aortic arch region and selectively positioned in the innominate artery, the right common carotid artery and the left common carotid artery. FINDINGS: The innominate artery angiogram demonstrates the origin of the right subclavian artery and the right common carotid  artery to be widely patent. The right vertebral artery at its origin demonstrates wide patency. More distally the vessel is seen to opacify to the cranial skull base. On the left lateral projection, the right vertebrobasilar junction and the opacified portion of the basilar artery and the posterior cerebral arteries demonstrate wide patency with free advancement of contrast into the delayed capillary phase. The right common carotid arteriogram demonstrates the right external carotid artery and its major branches to be widely patent. The right internal carotid artery demonstrated complete angiographic occlusion just distal to the bulb with a flame shape configuration. There is no delayed evidence of string sign. Also no reconstitution of the right internal carotid artery intracranially is seen from the external carotid artery branches. PROCEDURE: The diagnostic JB 1 catheter in the right common carotid artery was then exchanged over a 0.035 inch 300 cm Rosen exchange guidewire for an 8 [place] Pinnacle sheath in the right groin which was connected to continuous heparinized saline infusion. Over the [facility] exchange guidewire, a 95 cm 087 [place] balloon guide catheter which had been prepped with 50% contrast and 50% heparinized saline  infusion was then advanced and positioned just proximal to the origin of the right internal carotid artery. The guidewire was removed. Good aspiration obtained from the hub of the [place] balloon guide catheter. A gentle control arteriogram performed through the balloon guide catheter continued to demonstrate no change in the extracranial or intracranial circulations. Over a 0.014 inch standard Synchro micro guidewire with a J configuration, a 132 cm 6 [place] Catalyst guide catheter inside of which was an [employer] microcatheter was advanced with the micro guidewire leading with the J configuration to the supraclinoid segment. The micro guidewire was then gently manipulated with a torque device to enter the right middle cerebral artery into the distal M1 M2 junction. This was then followed by the microcatheter. The micro guidewire was then gently retrieved and removed. There was free aspiration of blood from the hub of the microcatheter. A gentle control arteriogram performed through this demonstrated excellent flow into the MCA bifurcation branches. A 4 mm x 40 mm Solitaire X retrieval device was then deployed with the distal M1 segment, and the proximal portion in the supraclinoid segment. At this time, the Catalyst guide catheter was advanced into the proximal aspect of the clot. With constant aspiration being applied at the hub of the [facility] guide catheter with a 60 mL syringe, and constant aspiration being applied with a Penumbra aspiration device at the hub of the Catalyst guide catheter, for 2 minutes, the combination of the retrieval device, the microcatheter, the 6 [place] Catalyst guide catheter and eventually the balloon guide catheter was retrieved and removed. Copious amounts of soft clot was seen in [place], the 6 [place] Catalyst guide catheter, and also the Walrus balloon guide catheter. A diagnostic JB 1 catheter advanced into the right common carotid artery demonstrated mild to moderately  improved flow in the right internal carotid proximally. There continued be occlusion in the more distal cavernous and supraclinoid segments. The combination was advanced again as a mentioned above. The micro guidewire was advanced into the right middle cerebral artery M1 M2 region followed by the microcatheter. The 6 French Catalyst guidewire was advanced into the occluded proximal portion of the clot. The 4 mm x 40 mm retrieval device was again deployed as above. With aspiration being applied at the hub of the 6 [place] Catalyst guide catheter with a Penumbra aspiration device, and a  60 mL syringe at the hub of the balloon the Catalyst guide catheter was retrieved the to the origin of the right internal carotid artery demonstrated guide catheter for about 2-1/2 minutes, the combination was again retrieved and removed. At this time free aspiration was noted at the hub of the [facility] balloon guide catheter in the mid right internal carotid artery. The aspirate demonstrated copious amounts of dark clot with small pieces. A control arteriogram performed through the [facility] at the origin of the right internal carotid now demonstrated complete angiographic revascularization of the right internal carotid artery extra cranially and intracranially. The right middle cerebral artery and the right anterior cerebral artery also demonstrated complete angiographic revascularization with no evidence of intraluminal filling defects or of occlusions. Spasm was noted in the right internal carotid artery and responded to 3 aliquots of 25 mcg of nitroglycerin with elevation of the spasm. No evidence of intimal flap was noted. The balloon guide was then retrieved and removed. The 8 French Pinnacle sheath in the right groin was then replaced with an 8 French Angio-Seal closure device with hemostasis. The right groin appeared soft. Distal pulses remained Dopplerable in the dorsalis pedis, and the posterior tibial arteries  bilaterally unchanged. The patient was loaded with 81 mg of aspirin, and 180 mg of Brilinta via an orogastric tube. Additionally, the patient was given 7.5 mg of intra-arterial Integrilin in order to prevent formation of platelet aggregates. A flat panel CT of the brain demonstrated no evidence of hemorrhage, mass effect, or midline shift. The patient's general anesthesia was then reversed. Upon recovery, the patient was able to obey simple commands with movement of the right upper and right lower extremities. Left over left demonstrated more activity. Patient was then transferred to the neuro ICU to continue with post thrombectomy management. IMPRESSION: Status post endovascular complete revascularization of the right internal carotid artery extra cranially and intracranially and partially the right middle cerebral artery proximally at its origin with 2 passes with the Solitaire X 4 x 40 mm retrieval device, and Penumbra aspiration achieving a TICI 3 revascularization of the right middle cerebral artery, and the right anterior cerebral artery distributions. PLAN: As per referring MD. Electronically Signed   By: Julieanne CottonSanjeev  Deveshwar M.D.   On: 01/29/2019 10:44    PHYSICAL EXAM     General - frail elderly caucasian male in mild distress.  Ophthalmologic - fundi not visualized due to noncooperation.  Cardiovascular - Regular rhythm and rate, not in afib.  Neuro- awake alert, orientated to self, age, place, month and situation, but not to year. Able to name without difficulty, able to repeat simple sentences. Able to follow simple commands. No gaze deviation, no hemianopia. Tracking on both sides, PERRL. Mild left facial droop. Tongue midline. Left UE 4+/5, no drift. LLE proximal 4+/5, distal 5/5 with toe DF and PF. RUE and RLE 5/5. Sensation symmtrical subjectively. FTN intact on the right but dysmetria on the left. Gait not tested.   ASSESSMENT/PLAN Adam Leon is a 85 y.o. male with history  of HLD, HTN, DM presenting after a fall with L sided weakness face, arm and leg.   Stroke: bilateral anterior and posterior infarcts with L ICA occlusion s/p IR w TICI3 revascularization, likely embolic d/t new AF w/ RVR and LV thrombus in the setting of undiagnosed recent MI  Code Stroke CT head No acute abnormality. pontiue hypodensity. ASPECTS 10.     CTA head & neck R ICA occlusion. Decreased R  MCA flow w/ good cross-filling.  CT perfusion 13 mL core infarct R watershed w/ delayed perfusion R MCA d/t R ICA occlusion   Cerebral Angio occlusion intra and extracranial ICA with TICI3 reperfusion    MRI  Scattered small cerebrum and cerebellar bilateral infarcts (greatest R frontoparietal)  MRA  Limited unremarkable  Carotid Doppler  R 1-39% stenosis    2D Echo severe LV dysfunction < 20% w/ possible stress cardiomyopathy w/ apical ballooning and LV thrombus  TG 244, HDL < 10  Direct LDL - 58.5  HgbA1c 8.8  warfarin for VTE prophylaxis -> D/c 12.25  aspirin 81 mg daily prior to admission, now on warfarin. Goal INR 2-3  Therapy recommendations: SNF  Disposition:  pending   PAF w/ RVR, new diagnosis  Home anticoagulation:  none   CHA2DS2-VASc Score = at least 6, ?2 oral anticoagulation recommended  Age in Years:  ?69   +2    Sex:  Male   0    Hypertension History:  yes   +1     Diabetes Mellitus:  yes   +1  Congestive Heart Failure History:  0  Vascular Disease History:  yes   +1     Stroke/TIA/Thromboembolism History:  yes   +2 . Started on amiodarone drip w/ bolus due to RVR -> now po amiodarone . on coumadin with INR goal 2-3 . INR 2.3->3.2  Abnormal EKG w/ ST elevation LV Systolic Dysunction, LV thrombus  Felt likely reflective of ICA occlusion w/ demand ischemia  EKG w/ concern for anterior MI  Trop 15519->15715->11893   2D Echo severe LV dysfunction < 20% w/ possible stress cardiomyopathy w/ apical ballooning and LV thrombus  No BB d/t low BP  BNP  4321.1->2,302->2613.1   Probable anterolateral MI prior to admission per cardiology  Cardiology on board  Acute Respiratory failure   CCM signed off 01/31/19  Recurrent episode of respiratory distress  sats to 80s on 2L w/ wheezing 12/25 during the night  Likely d/t volume overload  Treated w/ lasix  Repeat CXR 12/25 w/ some edema, ? PNA  Repeat CXR 12/27 Improved but persistent bilateral interstitial and airspace disease  Continue maintenance lasix     Hypotension Hx Hypertension  Home meds:  atenolol 25 daily, lisinopril 5  Low BP felt to be related to AF in setting of severe LV dysfunction  BP goal 110-140  off levophed   Stopped IVF d/t severe LV dysfunction  Hyperlipidemia  Home meds:  pravachol 40  TG 244 and HDL < 10  Direct LDL - 58.5, goal < 70  AST 33, ALT 49 on 02/02/19  On lipitor 40  Continue lipitor on discharge   Diabetes type II Uncontrolled  Home meds:  Metformin 500 bid  HgbA1c 8.8, goal < 7.0  CBGs  SSI  Close PCP follow up   UGIB, resolved - ? Stress ulcer  Coffee ground color emesis - resolved   Off NG suction  PPI IV Q12h  On diet now  Other Stroke Risk Factors  Advanced age  MI, LV thrombus and low EF - coumadin  Other Active Problems  Mild cognitive decline at baseline suspect this is now aggravated by his stroke, heart failure and sundowning.- agitation early AM 01/31/19. Improved today  Physical deconditioning over past 2 weeks going from independent walking to using a walker. Had ER eval 2 wks ago for fatigue and decreased appetite.   AKI  1.57->1.48->1.76->1.60->1.53  Leukocytosis WBC 10.1->10.4->20.8->11.9->10.9  Hypokalemia, resolved  Hospital day # 30  I had long discussion with daughter and granddaughter at bedside, updated pt current condition, treatment plan and potential prognosis, and answered all the questions. They expressed understanding and appreciation. We agree that SNF is the best  placement for pt at this time. Will inform SW to work on this.   Marvel Plan, MD PhD Stroke Neurology 02/08/2019 12:02 PM  I spent  35 minutes in total face-to-face time with the patient, more than 50% of which was spent in counseling and coordination of care, reviewing test results, images and medication, and discussing the diagnosis, treatment plan and potential prognosis. This patient's care requiresreview of multiple databases, neurological assessment, discussion with family, other specialists and medical decision making of high complexity.     To contact Stroke Continuity provider, please refer to [email or web address]. After hours, contact General Neurology

## 2019-02-08 NOTE — TOC Progression Note (Signed)
Transition of Care [facility]) - Progression Note    Patient Details  Name: Bryten Maher MRN: [id number] Date of Birth: 20-Feb-1930  Transition of Care [facility]) CM/SW [place], [place] Phone Number: 02/08/2019, 2:39 PM  Clinical Narrative:     Disposition plan has been changed for Skilled Nursing. Patient has improved enough and the family wants to pursue next level of care.   CSW called and spoke with the patient's daughter about a change in the plan. She is agreeable with SNF. She is familiar with the SNF process. CSW obtained permission to fax the patient out and provide bed offers. Patient's daughter is interested in having outpatient palliative services follow her father at the SNF. CSW provided a copy of the CMS SNF list via email. CSW will provide bed offers to her as they become available.   CSW called and spoke with [place] with [facility]. She has changed his referral from hospice to palliative.   Expected Discharge Plan: [place] Barriers to Discharge: Continued Medical Work up, Ship broker  Expected Discharge Plan and Services Expected Discharge Plan: [place] In-house Referral: Clinical Social Work, Hospice / Palliative Care Discharge Planning Services: NA Post Acute Care Choice: [place] Living arrangements for the past 2 months: Single Family Home                 DME Arranged: N/A DME Agency: NA       HH Arranged: NA HH Agency: NA         Social Determinants of Health (SDOH) Interventions    Readmission Risk Interventions No flowsheet data found.

## 2019-02-08 NOTE — Progress Notes (Signed)
[place] for Heparin/Coumadin Indication: ACS, S/P carotid embolectomy, and LV thrombus   Patient Measurements: Height: 6' (182.9 cm) Weight: 195 lb (88.5 kg) IBW/kg (Calculated) : 77.6 Heparin weight: 88.5 kg  Vital Signs: Temp: 98.7 F (37.1 C) (12/27 0410) Temp Source: Oral (12/27 0410) BP: 91/54 (12/27 0410) Pulse Rate: 77 (12/27 0410)  Labs: Recent Labs    02/05/19 0757 02/05/19 2034 02/06/19 0517 02/08/19 0352  HGB  --   --   --  12.2*  HCT  --   --   --  38.6*  PLT  --   --   --  433*  LABPROT  --   --  25.6* 32.5*  INR  --   --  2.3* 3.2*  HEPARINUNFRC 0.82* 0.72* 0.66  --   CREATININE  --   --   --  1.53*    Estimated Creatinine Clearance: 38 mL/min (A) (by C-G formula based on SCr of 1.53 mg/dL (H)).    Assessment: 85 y.o. male with EKG changes, elevated cardiac markers and LV thrombus, S/P CVA and carotid thrombectomy, started on warfarin.   INR 3.2 today  Goal of Therapy:  INR 2-3 Monitor platelets by anticoagulation protocol: Yes    Plan:   Warfarin 1.5 mg x 1  Daily INR  Barth Kirks, PharmD, BCPS, BCCCP Clinical Pharmacist [phone]  Please check AMION for all [place] numbers  02/08/2019 7:55 AM

## 2019-02-09 ENCOUNTER — Inpatient Hospital Stay (HOSPITAL_COMMUNITY): Payer: Medicare HMO

## 2019-02-09 DIAGNOSIS — I5023 Acute on chronic systolic (congestive) heart failure: Secondary | ICD-10-CM

## 2019-02-09 LAB — CBC
HCT: 38 % — ABNORMAL LOW (ref 39.0–52.0)
Hemoglobin: 12 g/dL — ABNORMAL LOW (ref 13.0–17.0)
MCH: 27.3 pg (ref 26.0–34.0)
MCHC: 31.6 g/dL (ref 30.0–36.0)
MCV: 86.6 fL (ref 80.0–100.0)
Platelets: 406 10*3/uL — ABNORMAL HIGH (ref 150–400)
RBC: 4.39 MIL/uL (ref 4.22–5.81)
RDW: 14.4 % (ref 11.5–15.5)
WBC: 20.2 10*3/uL — ABNORMAL HIGH (ref 4.0–10.5)
nRBC: 0 % (ref 0.0–0.2)

## 2019-02-09 LAB — CULTURE, BLOOD (ROUTINE X 2)
Culture: NO GROWTH
Culture: NO GROWTH

## 2019-02-09 LAB — URINALYSIS, COMPLETE (UACMP) WITH MICROSCOPIC
Bilirubin Urine: NEGATIVE
Glucose, UA: 50 mg/dL — AB
Ketones, ur: NEGATIVE mg/dL
Nitrite: NEGATIVE
Protein, ur: 30 mg/dL — AB
RBC / HPF: >50 RBC/hpf — ABNORMAL HIGH (ref 0–5)
Specific Gravity, Urine: 1.017 (ref 1.005–1.030)
pH: 5 (ref 5.0–8.0)

## 2019-02-09 LAB — BASIC METABOLIC PANEL
Anion gap: 10 (ref 5–15)
BUN: 26 mg/dL — ABNORMAL HIGH (ref 8–23)
CO2: 31 mmol/L (ref 22–32)
Calcium: 8.3 mg/dL — ABNORMAL LOW (ref 8.9–10.3)
Chloride: 97 mmol/L — ABNORMAL LOW (ref 98–111)
Creatinine, Ser: 1.64 mg/dL — ABNORMAL HIGH (ref 0.61–1.24)
GFR calc Af Amer: 43 mL/min — ABNORMAL LOW (ref >60)
GFR calc non Af Amer: 37 mL/min — ABNORMAL LOW (ref >60)
Glucose, Bld: 245 mg/dL — ABNORMAL HIGH (ref 70–99)
Potassium: 4 mmol/L (ref 3.5–5.1)
Sodium: 138 mmol/L (ref 135–145)

## 2019-02-09 LAB — PROTIME-INR
INR: 4.4 (ref 0.8–1.2)
Prothrombin Time: 41.7 seconds — ABNORMAL HIGH (ref 11.4–15.2)

## 2019-02-09 MED ORDER — FUROSEMIDE 20 MG PO TABS
20.0000 mg | ORAL_TABLET | Freq: Every day | ORAL | Status: DC
  Administered 2019-02-10: 20 mg via ORAL
  Filled 2019-02-09: qty 1

## 2019-02-09 MED ORDER — SODIUM CHLORIDE 0.9 % IV SOLN: INTRAVENOUS | Status: DC

## 2019-02-09 NOTE — Progress Notes (Signed)
  Speech Language Pathology Treatment: Dysphagia  Patient Details Name: Adam Leon MRN: [id number] DOB: 01-16-1931 Today's Date: 02/09/2019 Time: 7741-2878 SLP Time Calculation (min) (ACUTE ONLY): 14 min  Assessment / Plan / Recommendation Clinical Impression  Patient seen at bedside, [place] raised. Pt appearing lethargic, able to be roused. Pt initially with difficulty following commands to open mouth, then able to follow with tactile and verbal cues. Pt provided spoon sip of thin liquid, initially not grasping spoon orally. Able to grasp spoon and extract liquid with further verbal/tactile cues. Pt then provided cup sips of thin liquids, mild oral holding noted prior to initiation of the swallow. SLP attempted to have patient hold cup to self administer, pt did not demonstrate ability to hold cup despite max cues and HOH assist. Pt provided further cup sips thin liquid (water), pt with intermittent oral holding, audible swallow noted. No wet voice quality noted. Pt then provided straw sips of thin liquids, oral holding noted, good labial grasp of straw, audible swallow noted and immediate throat clear x1. Pt provided puree (applesauce) via spoon, some very mild anterior spillage, pt demonstrated awareness. Good oral clearance, no overt s/s aspiration noted with the applesauce. Patient taking approx 5 spoon bites of applesauce. Pt refused further solid PO trials, despite encouragement. Pt demonstrated tiredness. Per nurse, pt has been tolerating dysphagia 2 diet without difficulty noted.     HPI HPI: Adam Leon is a 85 y.o. male who has a PMH including but not limited to syncope, HLD.  He presented to [facility] ED [facility]/16 with left hemiplegia.  CTA showed R ICA occlusion.  He was subsequently taken to IR for arteriogram with complete revascularization of occluded extracranial and intracranial R ICA. CXR 12/17: interstitial edema.  CT 12/16: R MCA infarct in deep white matter      SLP Plan  Continue  with current plan of care       Recommendations  Diet recommendations: Dysphagia 2 (fine chop);Thin liquid Liquids provided via: Straw;Cup;Teaspoon Medication Administration: Whole meds with puree Supervision: Intermittent supervision to cue for compensatory strategies;Staff to assist with self feeding Compensations: Minimize environmental distractions;Slow rate;Small sips/bites;Multiple dry swallows after each bite/sip Postural Changes and/or Swallow Maneuvers: Seated upright 90 degrees;Upright 30-60 min after meal                Oral Care Recommendations: Oral care QID SLP Visit Diagnosis: Dysphagia, oropharyngeal phase (R13.12) Plan: Continue with current plan of care       [place] 02/09/2019, 10:27 AM

## 2019-02-09 NOTE — Progress Notes (Signed)
Progress Note  Patient Name: Adam Leon Date of Encounter: 02/09/2019  Primary Cardiologist: Fransico Him, MD (new)  Subjective   Unable to get review of symptoms with patient.  Daughter indicates that he has been very somnolent and minimally responsive with intermittent twitching episodes over the course of the morning.  Does not appear to be any distress.  Inpatient Medications    Scheduled Meds: . amiodarone  200 mg Oral Daily  . aspirin EC  81 mg Oral Daily  . atorvastatin  40 mg Oral q1800  . chlorhexidine  15 mL Mouth Rinse BID  . Chlorhexidine Gluconate Cloth  6 each Topical Daily  . feeding supplement (ENSURE ENLIVE)  237 mL Oral BID BM  . furosemide  20 mg Oral Daily  . ipratropium-albuterol  3 mL Nebulization Once  . mouth rinse  15 mL Mouth Rinse q12n4p  . QUEtiapine  12.5 mg Oral QHS  . sodium chloride flush  10-40 mL Intracatheter Q12H   Continuous Infusions:  PRN Meds: acetaminophen **OR** acetaminophen (TYLENOL) oral liquid 160 mg/5 mL **OR** acetaminophen, albuterol, LORazepam **OR** [DISCONTINUED] LORazepam **OR** [DISCONTINUED] LORazepam, polyethylene glycol, sodium chloride flush   Vital Signs    Vitals:   02/08/19 2344 02/09/19 0310 02/09/19 0824 02/09/19 1214  BP: (!) 89/60 103/64 (!) 86/52 (!) 82/53  Pulse: 91 (!) 101 90 83  Resp: 18 18 18 16   Temp: 99.1 F (37.3 C) 98.9 F (37.2 C) 99 F (37.2 C) 99.2 F (37.3 C)  TempSrc: Oral Oral Axillary Axillary  SpO2: 97% 96% 94% 95%  Weight:      Height:        Intake/Output Summary (Last 24 hours) at 02/09/2019 1222 Last data filed at 02/09/2019 0912 Gross per 24 hour  Intake 222 ml  Output 700 ml  Net -478 ml   Last 3 Weights 01/28/2019  Weight (lbs) 195 lb  Weight (kg) 88.451 kg      Telemetry    Not on telemetry  ECG    No new tracing this morning.  Physical Exam  Confused frail older WM General appearance: no distress and Somewhat lethargic.  Minimal responsiveness.   Apparently was given medications for agitation overnight. Neck: no carotid bruit and no JVD Lungs: Mostly CTA B, but minimal effort.  Would not follow commands Heart: RRR with no murmurs or rubs.  Possible S4 gallop heard.  Somewhat sustained PMI Abdomen: soft, non-tender; bowel sounds normal; no masses,  no organomegaly Extremities: extremities normal, atraumatic, no cyanosis or edema Pulses: 2+ and symmetric Neurologic: Mental status: Somnolent, groggy with intermittent twitching of arms and chest with grimacing.  Barely wakes up -unable to fully evaluate  Labs    High Sensitivity Troponin:   Recent Labs  Lab 01/28/19 2120 01/28/19 2313 01/29/19 1107  TROPONINIHS 15,519* 15,715* 11,893*      Chemistry Recent Labs  Lab 02/04/19 0342 02/05/19 0424 02/08/19 0352  NA 138 141 137  K 4.7 3.8 3.8  CL 104 100 96*  CO2 23 28 31   GLUCOSE 170* 141* 191*  BUN 34* 35* 29*  CREATININE 1.76* 1.60* 1.53*  CALCIUM 8.1* 8.4* 8.3*  PROT  --   --  5.7*  ALBUMIN  --   --  2.4*  AST  --   --  31  ALT  --   --  30  ALKPHOS  --   --  145*  BILITOT  --   --  0.6  GFRNONAA 34* 38* 41*  GFRAA 40* 45* 47*  ANIONGAP 11 13 10      Hematology Recent Labs  Lab 02/04/19 0342 02/05/19 0424 02/08/19 0352  WBC 20.8* 11.9* 10.9*  RBC 4.27 4.47 4.44  HGB 12.0* 12.2* 12.2*  HCT 36.7* 38.7* 38.6*  MCV 85.9 86.6 86.9  MCH 28.1 27.3 27.5  MCHC 32.7 31.5 31.6  RDW 14.6 14.8 14.6  PLT 474* 444* 433*    BNP Recent Labs  Lab 02/05/19 0424  BNP 2,613.1*     DDimer No results for input(s): DDIMER in the last 168 hours.   Radiology    DG CHEST PORT 1 VIEW  Result Date: 02/08/2019 CLINICAL DATA:  CHF. EXAM: PORTABLE CHEST 1 VIEW COMPARISON:  02/06/2019 FINDINGS: 0920 hours. Low lung volumes. The cardiopericardial silhouette is within normal limits for size. Patchy ill-defined bilateral airspace disease noted, superimposed on background chronic interstitial coarsening. More prominent  basilar opacity seen bilaterally in the right infrahilar region and retrocardiac left base. Tiny bilateral pleural effusions noted. IMPRESSION: Improved but persistent bilateral interstitial and airspace disease, right greater the left compatible with asymmetric edema or multifocal infection. Tiny bilateral pleural effusions. Electronically Signed   By: Kennith CenterEric  Mansell M.D.   On: 02/08/2019 10:48    Cardiac Studies   CT head 12/16 > no acute infarct. CTA head / neck 12/16 >5313ml core infarct on right. Occlusion of right ICA.  MRI/MRAbrain 12/17  IMPRESSION: 1. Severely motion degraded examination. 2. Scattered small acute to early subacute infarcts in the cerebrum and cerebellum bilaterally, greatest in the right frontoparietal region.  3. Limited head MRA due to motion with patency of the large intracranial arteries as above.  Echo 01/29/2019: Severely reduced EF of<20%.  Apical ballooning and akinesis of the entire anterior apical wall.  Also mid inferior and inferoseptal akinesis.  Mid-apical inferior, anterior and lateral as well as apical inferolateral akinesis.  Moderate sized fixed thrombus in the apical wall of the LV.  Only GR 1 DD estimated.  Patient Profile     85 y.o. male with hyperlipidemia, hypertension, diabetes, dementia who presented with who presented with right-sided gaze preference and left-sided hemiplegia. Subsequently underwent thrombectomy of left carotid/internal carotid. Cardiac high-sensitivity troponins were elevated on admission and EKG demonstrated anterior ST elevation. ST elevation has persisted, enzymes are decreasing, thrombus in LV apex, felt to be the source of the patient's stroke on an embolic basis.  Patient was made DNR--Per the daughter, this is that has been the patient's wishes..     --> Per the daughter, the preceding 2 weeks prior to her his presentation were notable for him being essentially minimally responsive and lethargic.  Basically  bedbound.  Question etiology of possibly new onset A. fib leading to stroke and then stress-induced cardiomyopathy versus large anterior MI (which would be unusual in the absence of true angina or CHF symptoms) leading to LV apical thrombus that then led to the stroke.  Assessment & Plan    1. Probable anterolateral MI versus stress-induced cardiomyopathy:prior to admission resulting in marked wall motion abnormality with apical thrombus and EF less than 20%.HsT peaked at [address]15.ECG with ST elevation anterolaterally. Repeat ECG showedQ waves V1 through V3 with persistent ST elevation anterolaterally.  Thought to be essentially completed infarct and plan was medical management.  No plans for invasive evaluation with recent CVA.  on ASA and statin  Unable to tolerate beta-blocker or ARB because of hypotension.  Is on amiodarone which does provide some beta-blocker.   2. 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 Residual left hemiparesis.   Warfarin started for anticoagulation, INR  4.4 today. -On hold today.  Need to consider dosing adjustment based on age and presence of amiodarone.  Monitored by pharmacist team  3. Presumed ischemic cardiomyopathy: EF ~<20%:Initially tolerated low-dose carvedilol, but now being held because of hypotension.  No longer on beta-blocker or ARB.-Reviewed with recent CVA, need to avoid hypotension.  No further episodes of respiratory distress.  Was diuresed aggressively and now placed on maintenance Lasix 20 mg.  Not able to tolerate ARB or beta-blocker because of hypotension, --> if blood pressure remains low today, would hold Lasix in the morning.  4. AKI: Creatinine 2.32 >>1.8 >>1.78>>1.57>>1.48>>1.76>>1.6-> 1.53 today  Slow gradual improvement.  5. Atrial fibrillation: Converted to sinus rhythm on IV amiodarone which was started 01/30/2019.  Appears to be  maintaining sinus rhythm on oral amiodarone 200 mg twice daily. -->  Would convert to once daily amiodarone 200 mg on discharge  6. LFT elevation:now resolved.  7. Hyperlipidemia:Target LDL less than 70. ->  Started on statin  8. Acute respiratory distress: Overall stable.  As needed increased dosing of Lasix.   [facility] will sign off.   Medication Recommendations: As noted above, hold Lasix for hypotension, convert to once daily amiodarone 200 mg on discharge Other recommendations (labs, testing, etc): No pertinent follow-up labs from cardiology standpoint Follow up as an outpatient: Please contact Laverda Page, NP  prior to discharge.  She will be working on outpatient follow-up with Dr. Mayford Knife or APP in 1 to 2 weeks post discharge    For questions or updates, please contact [facility] Please consult [email or web address] for contact info under        Signed, Bryan Lemma, MD  02/09/2019, 12:22 PM

## 2019-02-09 NOTE — TOC Progression Note (Signed)
Transition of Care [facility]) - Progression Note    Patient Details  Name: Adam Leon MRN: [id number] Date of Birth: 1930/12/19  Transition of Care [facility]) CM/SW [place], [place] Phone Number: 02/09/2019, 2:36 PM  Clinical Narrative:     Provided bed offers to patient's daughter, Laureen. She will look over the facilities and make a decision by tomorrow.   CSW will continue to follow and assist with discharge planning.    Expected Discharge Plan: [place] Barriers to Discharge: Continued Medical Work up, Ship broker  Expected Discharge Plan and Services Expected Discharge Plan: [place] In-house Referral: Clinical Social Work, Hospice / Palliative Care Discharge Planning Services: NA Post Acute Care Choice: [place] Living arrangements for the past 2 months: Single Family Home                 DME Arranged: N/A DME Agency: NA       HH Arranged: NA HH Agency: NA         Social Determinants of Health (SDOH) Interventions    Readmission Risk Interventions No flowsheet data found.

## 2019-02-09 NOTE — Progress Notes (Signed)
Physician notified of critical INR result of 4.4 per main lab.

## 2019-02-09 NOTE — Progress Notes (Signed)
STROKE TEAM PROGRESS NOTE   INTERVAL HISTORY Pt daughter at the bedside. Patient drowsy sleepy this morning, ate breakfast but not lunch. BP 90s, had Lasix and now BP 80s. WBC elevated to 20.2, and creatinine slowly trending up at 1.64. Cardiology consulted, recommend no Lasix if SBP less than 100. Will give gentle hydration. 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  IMAGES CT Code Stroke CTA Head W/WO contrast  Result Date: 01/28/2019 CLINICAL DATA:  Stroke.  Slurred speech.  Left facial droop. EXAM: CT ANGIOGRAPHY HEAD AND NECK CT PERFUSION BRAIN TECHNIQUE: Multidetector CT imaging of the head and neck was performed using the standard protocol during bolus administration of intravenous contrast. Multiplanar CT image reconstructions and MIPs were obtained to evaluate the vascular anatomy. Carotid stenosis measurements (when applicable) are obtained utilizing NASCET criteria, using the distal internal carotid diameter as the denominator. Multiphase CT imaging of the brain was performed following IV bolus contrast injection. Subsequent parametric perfusion maps were calculated using RAPID software. CONTRAST:  OMNIPAQUE IOHEXOL 350 MG/ML SOLN COMPARISON:  CT head 01/28/2019 FINDINGS: CTA NECK FINDINGS Aortic arch: Proximal great vessels widely patent. Incomplete imaging of the aortic arch. Right carotid system: Right common carotid artery widely patent. 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 Right internal carotid artery remains occluded through the cavernous segment. Reconstitution of the supraclinoid internal carotid artery on the right. Left carotid system: Left carotid bifurcation widely patent without significant stenosis. Vertebral arteries: Both vertebral arteries are patent to the basilar without significant stenosis. Skeleton: No acute skeletal abnormality.  Poor dentition. Other neck: Thyroid goiter.  No mass lesion. Upper chest: Mild pleural thickening along the major fissure  on the right. Otherwise lungs clear. Review of the MIP images confirms the above findings CTA HEAD FINDINGS Anterior circulation: Right internal carotid artery is occluded through the cavernous segment with reconstitution of the supraclinoid segment. Both anterior and middle cerebral arteries are patent without significant stenosis or thrombus. Right anterior and middle cerebral arteries supplied through the anterior communicating artery. No significant posterior communicating artery identified on the right. Left cavernous carotid widely patent. Posterior circulation: Both vertebral arteries patent to the basilar. PICA patent. Basilar widely patent. AICA, superior cerebellar, posterior cerebral arteries patent bilaterally. Venous sinuses: Minimal venous contrast due to arterial phase scanning Anatomic variants: None Review of the MIP images confirms the above findings CT Brain Perfusion Findings: ASPECTS: 10 CBF (<30%) Volume: 13mL Perfusion (Tmax>6.0s) volume: Mismatch Volume: Infarction Location:Right MCA territory shows diffuse delayed perfusion. Core infarct in the right watershed territory in the deep white matter. IMPRESSION: 1. 13 mL core infarct in the watershed territory on the right. Large area of delayed perfusion right MCA territory due to occlusion of the right internal carotid artery. Right anterior middle cerebral arteries are patent and supplied through the anterior communicating artery. Collateral circulation is most likely the reason for delayed perfusion in the right MCA territory. 2. No other significant intracranial stenosis. 3. These results were called by telephone at the time of interpretation on 01/28/2019 at 9:54 pm to provider [facility] , who verbally acknowledged these results. Electronically Signed   By: Marlan Palau M.D.   On: 01/28/2019 21:56   CT Code Stroke CTA Neck W/WO contrast  Result Date: 01/28/2019 CLINICAL DATA:  Stroke.  Slurred speech.  Left facial  droop. EXAM: CT ANGIOGRAPHY HEAD AND NECK CT PERFUSION BRAIN TECHNIQUE: Multidetector CT imaging of the head and neck was performed using the standard protocol during bolus  administration of intravenous contrast. Multiplanar CT image reconstructions and MIPs were obtained to evaluate the vascular anatomy. Carotid stenosis measurements (when applicable) are obtained utilizing NASCET criteria, using the distal internal carotid diameter as the denominator. Multiphase CT imaging of the brain was performed following IV bolus contrast injection. Subsequent parametric perfusion maps were calculated using RAPID software. CONTRAST:  OMNIPAQUE IOHEXOL 350 MG/ML SOLN COMPARISON:  CT head 01/28/2019 FINDINGS: CTA NECK FINDINGS Aortic arch: Proximal great vessels widely patent. Incomplete imaging of the aortic arch. Right carotid system: Right common carotid artery widely patent. 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 Right internal carotid artery remains occluded through the cavernous segment. Reconstitution of the supraclinoid internal carotid artery on the right. Left carotid system: Left carotid bifurcation widely patent without significant stenosis. Vertebral arteries: Both vertebral arteries are patent to the basilar without significant stenosis. Skeleton: No acute skeletal abnormality.  Poor dentition. Other neck: Thyroid goiter.  No mass lesion. Upper chest: Mild pleural thickening along the major fissure on the right. Otherwise lungs clear. Review of the MIP images confirms the above findings CTA HEAD FINDINGS Anterior circulation: Right internal carotid artery is occluded through the cavernous segment with reconstitution of the supraclinoid segment. Both anterior and middle cerebral arteries are patent without significant stenosis or thrombus. Right anterior and middle cerebral arteries supplied through the anterior communicating artery. No significant posterior communicating artery identified on  the right. Left cavernous carotid widely patent. Posterior circulation: Both vertebral arteries patent to the basilar. PICA patent. Basilar widely patent. AICA, superior cerebellar, posterior cerebral arteries patent bilaterally. Venous sinuses: Minimal venous contrast due to arterial phase scanning Anatomic variants: None Review of the MIP images confirms the above findings CT Brain Perfusion Findings: ASPECTS: 10 CBF (<30%) Volume: 13mL Perfusion (Tmax>6.0s) volume: Mismatch Volume: Infarction Location:Right MCA territory shows diffuse delayed perfusion. Core infarct in the right watershed territory in the deep white matter. IMPRESSION: 1. 13 mL core infarct in the watershed territory on the right. Large area of delayed perfusion right MCA territory due to occlusion of the right internal carotid artery. Right anterior middle cerebral arteries are patent and supplied through the anterior communicating artery. Collateral circulation is most likely the reason for delayed perfusion in the right MCA territory. 2. No other significant intracranial stenosis. 3. These results were called by telephone at the time of interpretation on 01/28/2019 at 9:54 pm to provider [facility] , who verbally acknowledged these results. Electronically Signed   By: Marlan Palau M.D.   On: 01/28/2019 21:56   MR MRA HEAD WO CONTRAST  Result Date: 01/29/2019 CLINICAL DATA:  Stroke follow-up. Left-sided weakness. Status post endovascular revascularization of occluded right ICA. EXAM: MRI HEAD WITHOUT CONTRAST MRA HEAD WITHOUT CONTRAST TECHNIQUE: Multiplanar, multiecho pulse sequences of the brain and surrounding structures were obtained without intravenous contrast. Angiographic images of the head were obtained using MRA technique without contrast. COMPARISON:  Head CT, CTA, and CTP 01/28/2019 FINDINGS: MRI HEAD FINDINGS The study is motion degraded throughout with some sequences being severely degraded (including  nondiagnostic susceptibility weighted imaging). Brain: There are scattered small acute to early subacute infarcts in the right cerebral hemisphere most notably involving frontoparietal cortex in the perirolandic region. Small infarcts are also present more inferiorly in the right parietal lobe and in the right globus pallidus, and there is a single punctate subcortical infarct in the left parietal lobe. There are small acute to early subacute bilateral cerebellar infarcts as well. Motion artifact limits assessment  for intracranial hemorrhage. There is no midline shift or sizable extra-axial fluid collection. No age advanced chronic white matter disease is evident. Cerebral atrophy is not greater than expected for age. Vascular: Major intracranial vascular flow voids are grossly preserved. Skull and upper cervical spine: No gross marrow lesion. Sinuses/Orbits: Unremarkable orbits. Left maxillary sinusitis. Left anterior ethmoid air cell opacification. No significant mastoid fluid. Other: None. MRA HEAD FINDINGS The study is intermittently severely motion degraded limiting detailed evaluation. The visualized distal vertebral arteries are patent to the basilar and codominant. Cerebellar arteries are not well seen due to motion. The basilar artery is patent with focal severe motion artifact through its midportion and no evidence of significant stenosis elsewhere. Both PCAs are patent proximally. The included distal cervical and intracranial portions of both internal carotid arteries are patent with assessment for stenosis limited by motion. A1 and M1 segments are patent bilaterally with limited assessment of the branch vessels. IMPRESSION: 1. Severely motion degraded examination. 2. Scattered small acute to early subacute infarcts in the cerebrum and cerebellum bilaterally, greatest in the right frontoparietal region. 3. Limited head MRA due to motion with patency of the large intracranial arteries as above.  Electronically Signed   By: Sebastian AcheAllen  Grady M.D.   On: 01/29/2019 14:04   MR BRAIN WO CONTRAST  Result Date: 01/29/2019 CLINICAL DATA:  Stroke follow-up. Left-sided weakness. Status post endovascular revascularization of occluded right ICA. EXAM: MRI HEAD WITHOUT CONTRAST MRA HEAD WITHOUT CONTRAST TECHNIQUE: Multiplanar, multiecho pulse sequences of the brain and surrounding structures were obtained without intravenous contrast. Angiographic images of the head were obtained using MRA technique without contrast. COMPARISON:  Head CT, CTA, and CTP 01/28/2019 FINDINGS: MRI HEAD FINDINGS The study is motion degraded throughout with some sequences being severely degraded (including nondiagnostic susceptibility weighted imaging). Brain: There are scattered small acute to early subacute infarcts in the right cerebral hemisphere most notably involving frontoparietal cortex in the perirolandic region. Small infarcts are also present more inferiorly in the right parietal lobe and in the right globus pallidus, and there is a single punctate subcortical infarct in the left parietal lobe. There are small acute to early subacute bilateral cerebellar infarcts as well. Motion artifact limits assessment for intracranial hemorrhage. There is no midline shift or sizable extra-axial fluid collection. No age advanced chronic white matter disease is evident. Cerebral atrophy is not greater than expected for age. Vascular: Major intracranial vascular flow voids are grossly preserved. Skull and upper cervical spine: No gross marrow lesion. Sinuses/Orbits: Unremarkable orbits. Left maxillary sinusitis. Left anterior ethmoid air cell opacification. No significant mastoid fluid. Other: None. MRA HEAD FINDINGS The study is intermittently severely motion degraded limiting detailed evaluation. The visualized distal vertebral arteries are patent to the basilar and codominant. Cerebellar arteries are not well seen due to motion. The basilar  artery is patent with focal severe motion artifact through its midportion and no evidence of significant stenosis elsewhere. Both PCAs are patent proximally. The included distal cervical and intracranial portions of both internal carotid arteries are patent with assessment for stenosis limited by motion. A1 and M1 segments are patent bilaterally with limited assessment of the branch vessels. IMPRESSION: 1. Severely motion degraded examination. 2. Scattered small acute to early subacute infarcts in the cerebrum and cerebellum bilaterally, greatest in the right frontoparietal region. 3. Limited head MRA due to motion with patency of the large intracranial arteries as above. Electronically Signed   By: Sebastian AcheAllen  Grady M.D.   On: 01/29/2019 14:04  IR CT Head Ltd  Result Date: 01/30/2019 INDICATION: New onset of right gaze deviation, and left hemiplegia. 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 EXAM: 1. EMERGENT LARGE VESSEL OCCLUSION THROMBOLYSIS (anterior CIRCULATION) COMPARISON:  CT angiogram of the head and neck of January 28, 2019. MEDICATIONS: Ancef 2 g IV was administered within 1 hour of the procedure. ANESTHESIA/SEDATION: General anesthesia. CONTRAST:  Isovue 300 approximately 110 mL. FLUOROSCOPY TIME:  Fluoroscopy Time: 36 minutes 12 seconds (1466 mGy). COMPLICATIONS: None immediate. TECHNIQUE: Following a full explanation of the procedure along with the potential associated complications, an informed witnessed consent was obtained from the patient in a 3 way phone call. The risks of intracranial hemorrhage of 10%, worsening neurological deficit, ventilator dependency, death and inability to revascularize were all reviewed in detail with the patient's daughter. The patient was then put under general anesthesia by the Department of Anesthesiology at [facility]. The right groin was prepped  and draped in the usual sterile fashion. Thereafter using modified Seldinger technique, transfemoral access into the right common femoral artery was obtained without difficulty. Over a 0.035 inch guidewire a 5 French Pinnacle sheath was inserted. Through this, and also over a 0.035 inch guidewire a 5 [place] JB 1 catheter was advanced to the aortic arch region and selectively positioned in the innominate artery, the right common carotid artery and the left common carotid artery. FINDINGS: The innominate artery angiogram demonstrates the origin of the right subclavian artery and the right common carotid artery to be widely patent. The right vertebral artery at its origin demonstrates wide patency. More distally the vessel is seen to opacify to the cranial skull base. On the left lateral projection, the right vertebrobasilar junction and the opacified portion of the basilar artery and the posterior cerebral arteries demonstrate wide patency with free advancement of contrast into the delayed capillary phase. The right common carotid arteriogram demonstrates the right external carotid artery and its major branches to be widely patent. The right internal carotid artery demonstrated complete angiographic occlusion just distal to the bulb with a flame shape configuration. There is no delayed evidence of string sign. Also no reconstitution of the right internal carotid artery intracranially is seen from the external carotid artery branches. PROCEDURE: The diagnostic JB 1 catheter in the right common carotid artery was then exchanged over a 0.035 inch 300 cm Rosen exchange guidewire for an 8 [place] Pinnacle sheath in the right groin which was connected to continuous heparinized saline infusion. Over the [facility] exchange guidewire, a 95 cm 087 [place] balloon guide catheter which had been prepped with 50% contrast and 50% heparinized saline infusion was then advanced and positioned just proximal to the origin of the right  internal carotid artery. The guidewire was removed. Good aspiration obtained from the hub of the [place] balloon guide catheter. A gentle control arteriogram performed through the balloon guide catheter continued to demonstrate no change in the extracranial or intracranial circulations. Over a 0.014 inch standard Synchro micro guidewire with a J configuration, a 132 cm 6 [place] Catalyst guide catheter inside of which was an [employer] microcatheter was advanced with the micro guidewire leading with the J configuration to the supraclinoid segment. The micro guidewire was then gently manipulated with a torque device to enter the right middle cerebral artery into the distal M1 M2 junction. This was then followed by the microcatheter. The micro guidewire was then  gently retrieved and removed. There was free aspiration of blood from the hub of the microcatheter. A gentle control arteriogram performed through this demonstrated excellent flow into the MCA bifurcation branches. A 4 mm x 40 mm Solitaire X retrieval device was then deployed with the distal M1 segment, and the proximal portion in the supraclinoid segment. At this time, the Catalyst guide catheter was advanced into the proximal aspect of the clot. With constant aspiration being applied at the hub of the [facility] guide catheter with a 60 mL syringe, and constant aspiration being applied with a Penumbra aspiration device at the hub of the Catalyst guide catheter, for 2 minutes, the combination of the retrieval device, the microcatheter, the 6 [place] Catalyst guide catheter and eventually the balloon guide catheter was retrieved and removed. Copious amounts of soft clot was seen in [place], the 6 [place] Catalyst guide catheter, and also the [place] balloon guide catheter. A diagnostic JB 1 catheter advanced into the right common carotid artery demonstrated mild to moderately improved flow in the right internal carotid proximally. There continued be occlusion  in the more distal cavernous and supraclinoid segments. The combination was advanced again as a mentioned above. The micro guidewire was advanced into the right middle cerebral artery M1 M2 region followed by the microcatheter. The 6 French Catalyst guidewire was advanced into the occluded proximal portion of the clot. The 4 mm x 40 mm retrieval device was again deployed as above. With aspiration being applied at the hub of the 6 [place] Catalyst guide catheter with a Penumbra aspiration device, and a 60 mL syringe at the hub of the balloon the Catalyst guide catheter was retrieved the to the origin of the right internal carotid artery demonstrated guide catheter for about 2-1/2 minutes, the combination was again retrieved and removed. At this time free aspiration was noted at the hub of the [facility] balloon guide catheter in the mid right internal carotid artery. The aspirate demonstrated copious amounts of dark clot with small pieces. A control arteriogram performed through the [facility] guide catheter at the origin of the right internal carotid now demonstrated complete angiographic revascularization of the right internal carotid artery extra cranially and intracranially. The right middle cerebral artery and the right anterior cerebral artery also demonstrated complete angiographic revascularization with no evidence of intraluminal filling defects or of occlusions. Spasm was noted in the right internal carotid artery and responded to 3 aliquots of 25 mcg of nitroglycerin with elevation of the spasm. No evidence of intimal flap was noted. The balloon guide was then retrieved and removed. The 8 French Pinnacle sheath in the right groin was then replaced with an 8 French Angio-Seal closure device with hemostasis. The right groin appeared soft. Distal pulses remained Dopplerable in the dorsalis pedis, and the posterior tibial arteries bilaterally unchanged. The patient was loaded with 81 mg of aspirin, and 180 mg of  Brilinta via an orogastric tube. Additionally, the patient was given 7.5 mg of intra-arterial Integrilin in order to prevent formation of platelet aggregates. A flat panel CT of the brain demonstrated no evidence of hemorrhage, mass effect, or midline shift. The patient's general anesthesia was then reversed. Upon recovery, the patient was able to obey simple commands with movement of the right upper and right lower extremities. Left over left demonstrated more activity. Patient was then transferred to the neuro ICU to continue with post thrombectomy management. IMPRESSION: Status post endovascular complete revascularization of the right internal carotid artery extra cranially and intracranially  and partially the right middle cerebral artery proximally at its origin with 2 passes with the Solitaire X 4 x 40 mm retrieval device, and Penumbra aspiration achieving a TICI 3 revascularization of the right middle cerebral artery, and the right anterior cerebral artery distributions. PLAN: As per referring MD. Electronically Signed   By: Julieanne Cotton M.D.   On: 01/29/2019 10:44   CT C-SPINE NO CHARGE  Result Date: 01/28/2019 CLINICAL DATA:  Trauma.  Rule out spine fracture EXAM: CT CERVICAL SPINE WITHOUT CONTRAST TECHNIQUE: Multidetector CT imaging of the cervical spine was performed without intravenous contrast. Multiplanar CT image reconstructions were also generated. COMPARISON:  None. FINDINGS: Alignment: Normal Skull base and vertebrae: Negative for fracture Soft tissues and spinal canal: Goiter with enlargement of the right lobe of the thyroid. No adenopathy in the neck. Disc levels: Mild disc degeneration and spurring C5-6 with foraminal narrowing bilaterally. Upper chest: Mild thickening of the major fissure on the right. Otherwise lung apices clear. Other: None IMPRESSION: Negative for cervical spine fracture. Electronically Signed   By: Marlan Palau M.D.   On: 01/28/2019 21:59   CT Code Stroke  Cerebral Perfusion with contrast  Result Date: 01/28/2019 CLINICAL DATA:  Stroke.  Slurred speech.  Left facial droop. EXAM: CT ANGIOGRAPHY HEAD AND NECK CT PERFUSION BRAIN TECHNIQUE: Multidetector CT imaging of the head and neck was performed using the standard protocol during bolus administration of intravenous contrast. Multiplanar CT image reconstructions and MIPs were obtained to evaluate the vascular anatomy. Carotid stenosis measurements (when applicable) are obtained utilizing NASCET criteria, using the distal internal carotid diameter as the denominator. Multiphase CT imaging of the brain was performed following IV bolus contrast injection. Subsequent parametric perfusion maps were calculated using RAPID software. CONTRAST:  OMNIPAQUE IOHEXOL 350 MG/ML SOLN COMPARISON:  CT head 01/28/2019 FINDINGS: CTA NECK FINDINGS Aortic arch: Proximal great vessels widely patent. Incomplete imaging of the aortic arch. Right carotid system: Right common carotid artery widely patent. 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 Right internal carotid artery remains occluded through the cavernous segment. Reconstitution of the supraclinoid internal carotid artery on the right. Left carotid system: Left carotid bifurcation widely patent without significant stenosis. Vertebral arteries: Both vertebral arteries are patent to the basilar without significant stenosis. Skeleton: No acute skeletal abnormality.  Poor dentition. Other neck: Thyroid goiter.  No mass lesion. Upper chest: Mild pleural thickening along the major fissure on the right. Otherwise lungs clear. Review of the MIP images confirms the above findings CTA HEAD FINDINGS Anterior circulation: Right internal carotid artery is occluded through the cavernous segment with reconstitution of the supraclinoid segment. Both anterior and middle cerebral arteries are patent without significant stenosis or thrombus. Right anterior and middle cerebral  arteries supplied through the anterior communicating artery. No significant posterior communicating artery identified on the right. Left cavernous carotid widely patent. Posterior circulation: Both vertebral arteries patent to the basilar. PICA patent. Basilar widely patent. AICA, superior cerebellar, posterior cerebral arteries patent bilaterally. Venous sinuses: Minimal venous contrast due to arterial phase scanning Anatomic variants: None Review of the MIP images confirms the above findings CT Brain Perfusion Findings: ASPECTS: 10 CBF (<30%) Volume: 13mL Perfusion (Tmax>6.0s) volume: Mismatch Volume: Infarction Location:Right MCA territory shows diffuse delayed perfusion. Core infarct in the right watershed territory in the deep white matter. IMPRESSION: 1. 13 mL core infarct in the watershed territory on the right. Large area of delayed perfusion right MCA territory due to occlusion of the right internal  carotid artery. Right anterior middle cerebral arteries are patent and supplied through the anterior communicating artery. Collateral circulation is most likely the reason for delayed perfusion in the right MCA territory. 2. No other significant intracranial stenosis. 3. These results were called by telephone at the time of interpretation on 01/28/2019 at 9:54 pm to provider [facility] , who verbally acknowledged these results. Electronically Signed   By: Marlan Palau M.D.   On: 01/28/2019 21:56   DG CHEST PORT 1 VIEW  Result Date: 02/08/2019 CLINICAL DATA:  CHF. EXAM: PORTABLE CHEST 1 VIEW COMPARISON:  02/06/2019 FINDINGS: 0920 hours. Low lung volumes. The cardiopericardial silhouette is within normal limits for size. Patchy ill-defined bilateral airspace disease noted, superimposed on background chronic interstitial coarsening. More prominent basilar opacity seen bilaterally in the right infrahilar region and retrocardiac left base. Tiny bilateral pleural effusions noted. IMPRESSION:  Improved but persistent bilateral interstitial and airspace disease, right greater the left compatible with asymmetric edema or multifocal infection. Tiny bilateral pleural effusions. Electronically Signed   By: Kennith Center M.D.   On: 02/08/2019 10:48   DG CHEST PORT 1 VIEW  Result Date: 02/06/2019 CLINICAL DATA:  Shortness of breath, wheezing EXAM: PORTABLE CHEST 1 VIEW COMPARISON:  Radiograph 02/04/2019 FINDINGS: Similar appearance of the bilateral interstitial and airspace disease throughout both lungs. No visible pneumothorax or effusion. Increasing septal thickening and fissural thickening. Cardiomediastinal contours are stable. The osseous structures appear diffusely demineralized which may limit detection of small or nondisplaced fractures. No acute osseous or soft tissue abnormality. Support devices and cardiac monitoring leads overlie the chest. IMPRESSION: Similar appearance of the bilateral interstitial and airspace disease throughout both lungs, which could reflect edema or multifocal pneumonia. Favor at least some component of edema given the increasing fissural thickening Electronically Signed   By: Kreg Shropshire M.D.   On: 02/06/2019 02:00   DG CHEST PORT 1 VIEW  Result Date: 02/04/2019 CLINICAL DATA:  Increased shortness of breath EXAM: PORTABLE CHEST 1 VIEW COMPARISON:  January 29, 2019 FINDINGS: The heart size and mediastinal contours are unchanged. There is patchy/fluffy airspace opacities seen predominantly within the perihilar region with increased interstitial markings throughout both lungs. No pleural effusion is seen. No acute osseous abnormality. IMPRESSION: Interval development of patchy/fluffy perihilar airspace opacities. This could be due to asymmetric pulmonary edema and/or infectious etiology. Electronically Signed   By: Jonna Clark M.D.   On: 02/04/2019 00:35   DG CHEST PORT 1 VIEW  Result Date: 01/29/2019 CLINICAL DATA:  Stroke due to embolism EXAM: PORTABLE CHEST 1  VIEW COMPARISON:  01/15/2019 FINDINGS: Interstitial coarsening with Charyl Dancer lines. No visible effusion or pneumothorax. Normal heart size and stable mediastinal contours. Enteric tube tip and side-port reaches the stomach. IMPRESSION: Interstitial pulmonary edema. Electronically Signed   By: Marnee Spring M.D.   On: 01/29/2019 05:08   DG Chest Port 1 View  Result Date: 01/15/2019 CLINICAL DATA:  85 year old male with fever. EXAM: PORTABLE CHEST 1 VIEW COMPARISON:  None. FINDINGS: Background of mild emphysema. No focal consolidation, pleural effusion, or pneumothorax. The cardiac silhouette is within normal limits. No acute osseous pathology. IMPRESSION: No active disease. Electronically Signed   By: Elgie Collard M.D.   On: 01/15/2019 18:17   DG Abd Portable 1V  Result Date: 01/29/2019 CLINICAL DATA:  OG tube placement. EXAM: PORTABLE ABDOMEN - 1 VIEW COMPARISON:  None. FINDINGS: Tip of the enteric tube is below the diaphragm in the stomach, the side port is in the region of  the gastroesophageal junction. Recommend advancement of least 3 cm for optimal placement. Excreted IV contrast in both renal collecting systems and the urinary bladder from prior IV contrast. Lobular contour of the bladder suggests bladder diverticula or chronic bladder outlet obstruction no bowel obstruction. Surgical clips in the right upper quadrant likely from cholecystectomy. IMPRESSION: Tip of the enteric tube below the diaphragm in the stomach, the side-port in the region of the gastroesophageal junction. Recommend advancement of least 3 cm for optimal placement. Electronically Signed   By: Narda Rutherford M.D.   On: 01/29/2019 03:27   DG Swallowing Func-Speech Pathology  Result Date: 02/02/2019 Objective Swallowing Evaluation: Type of Study: MBS-Modified Barium Swallow Study  Patient Details Name: Adam Leon MRN: [id number] Date of Birth: 16-Aug-1930 Today's Date: 02/02/2019 Time: SLP Start Time (ACUTE ONLY): 1220  -SLP Stop Time (ACUTE ONLY): 1245 SLP Time Calculation (min) (ACUTE ONLY): 25 min Past Medical History: Past Medical History: Diagnosis Date . Hypercholesteremia  . Syncope  . Syncope  . Ventricular hypertrophy  Past Surgical History: Past Surgical History: Procedure Laterality Date . IR ANGIO INTRA EXTRACRAN SEL COM CAROTID INNOMINATE UNI L MOD SED  01/29/2019 . IR ANGIO VERTEBRAL SEL SUBCLAVIAN INNOMINATE UNI R MOD SED  01/29/2019 . IR CT HEAD LTD  01/29/2019 . IR PERCUTANEOUS ART THROMBECTOMY/INFUSION INTRACRANIAL INC DIAG ANGIO  01/29/2019 HPI: Adam Leon is a 85 y.o. male who has a PMH including but not limited to syncope, HLD.  He presented to [facility] ED 12/16 with left hemiplegia.  CTA showed R ICA occlusion.  He was subsequently taken to IR for arteriogram with complete revascularization of occluded extracranial and intracranial R ICA. CXR 12/17: interstitial edema.  CT 12/16: R MCA infarct in deep white matter  Subjective: Pt seen in radiology for MBS. Assessment / Plan / Recommendation CHL IP CLINICAL IMPRESSIONS 02/02/2019 Clinical Impression Pt seen in radiology for MBS to objectively assess swallow function and safety, based on clinical presentation at bedside. Pt was uprght in chair, awake and alert. He is hard of hearing. Pt was given trials of nectar thick liquid, thin liquid, puree, and solid textures. He was also given a barium tablet in water.  Orally, pt exhibited oral holding and piecemeal swallow, with premature spillage to the vallecular sinus. Pharyngeal swallow is characterized by trigger of swallow reflex at the vallecular sinus across consistencies. Very trace subepiglottic penetration was noted during the swallow of thin and nectar thick liquids, however, penetrate cleared completely and spontaeously and was not aspirated. Mild lateral channel and vallecular residue was noted after the swallow on most consistencies, which cleared with second/dry swallow. Barium tablet cleared the oral  cavity without delay, but was noted to pause in the vallecular sinus before clearing with second swallow. Esophageal sweep revealed it to be clear. Recommend continuing with Dys 2 solids and thin liquids, meds either whole or crushed in puree (based on pt mentation, which is likely to vary due to dx dementia). Recommend assistance with feeding as needed, again based on pt mentation. Safe swallow precautions were reviewed with RN, and sent in written form with transport to be placed at [facility]. SLP will follow up to assess diet tolerance and continue education.  SLP Visit Diagnosis Dysphagia, oropharyngeal phase (R13.12)     Impact on safety and function Mild aspiration risk;Moderate aspiration risk   CHL IP TREATMENT RECOMMENDATION 02/02/2019 Treatment Recommendations Therapy as outlined in treatment plan below   Prognosis 02/02/2019 Prognosis for Safe Diet Advancement Fair Barriers to Reach  Goals Cognitive deficits   CHL IP DIET RECOMMENDATION 02/02/2019 SLP Diet Recommendations Dysphagia 2 (Fine chop) solids;Thin liquid Liquid Administration via Cup;Straw Medication Administration Other (Comment) Compensations Minimize environmental distractions;Slow rate;Small sips/bites;Multiple dry swallows after each bite/sip Postural Changes Remain semi-upright after after feeds/meals (Comment);Seated upright at 90 degrees   CHL IP OTHER RECOMMENDATIONS 02/02/2019   Oral Care Recommendations Oral care QID     CHL IP FOLLOW UP RECOMMENDATIONS 01/30/2019 Follow up Recommendations Inpatient Rehab   CHL IP FREQUENCY AND DURATION 02/02/2019 Speech Therapy Frequency (ACUTE ONLY) min 2x/week Treatment Duration 2 weeks      CHL IP ORAL PHASE 02/02/2019 Oral Phase Impaired   Oral - Nectar Cup Holding of bolus;Delayed oral transit;Premature spillage   Oral - Thin Cup/Straw Piecemeal swallowing;Premature spillage   Oral - Puree Holding of bolus;Piecemeal swallowing;Premature spillage   Oral - Regular Piecemeal swallowing;Nasal  reflux;Premature spillage   Oral - Pill WFL    CHL IP PHARYNGEAL PHASE 02/02/2019 Pharyngeal Phase Impaired   Pharyngeal- Nectar Cup Delayed swallow initiation-vallecula;Reduced airway/laryngeal closure;Penetration/Aspiration during swallow;Pharyngeal residue - valleculae Pharyngeal Material does not enter airway;Material enters airway, remains ABOVE vocal cords then ejected out   Pharyngeal- Thin Cup Delayed swallow initiation-vallecula;Reduced airway/laryngeal closure;Penetration/Apiration after swallow;Pharyngeal residue - valleculae;Pharyngeal residue - pyriform;Lateral channel residue Pharyngeal Material does not enter airway;Material enters airway, remains ABOVE vocal cords then ejected out   Pharyngeal- Puree Delayed swallow initiation-vallecula   Pharyngeal- Regular Delayed swallow initiation-vallecula;Pharyngeal residue - valleculae   Pharyngeal- Pill Delayed swallow initiation-vallecula Pill paused in the vallecular sinus briefly before clearing the pharynx      CHL IP CERVICAL ESOPHAGEAL PHASE 02/02/2019 Cervical Esophageal Phase [facility] Asa, MSP, CCC-SLP Speech Language Pathologist Office: [phone] Pager: [phone] Leigh Aurora 02/02/2019, 1:52 PM              ECHOCARDIOGRAM COMPLETE  Result Date: 01/29/2019   ECHOCARDIOGRAM REPORT   Patient Name:   Adam Leon Date of Exam: 01/29/2019 Medical Rec #:  [id number]       Height:       72.0 in Accession #:    [id number]      Weight:       195.0 lb Date of Birth:  [birth year]       BSA:          2.11 m Patient Age:    88 years        BP:           120/63 mmHg Patient Gender: M               HR:           71 bpm. Exam Location:  Inpatient Procedure: 2D Echo        STAT ECHO Reported to: Dr. Mayford Knife. Indications:     stroke  History:         Patient has no prior history of Echocardiogram examinations.                  Signs/Symptoms:elevated troponin.  Sonographer:     Delcie Roch Referring Phys:  [phone] TRACI R TURNER Diagnosing Phys:  Armanda Magic MD IMPRESSIONS  1. Left ventricular ejection fraction, by visual estimation, is <20%. The left ventricle has severely decreased function. Left ventricular septal wall thickness was mildly increased. Mildly increased left ventricular posterior wall thickness.  2. There is apical ballooning with akinesis of the entire apical wall. There is akinesis of the mid infero and anteroseptum. There is akinesis  of the mid and apical inferior, anterior and lateral walls and akineseis of the apical inferolateral wall.  3. Moderate, fixed thrombus on the apical wall of the left ventricle.  4. Definity contrast agent was given IV to delineate the left ventricular endocardial borders.  5. Global right ventricle has normal systolic function.The right ventricular size is normal. No increase in right ventricular wall thickness.  6. Left atrial size was normal.  7. Right atrial size was normal.  8. The mitral valve is normal in structure. Trivial mitral valve regurgitation. No evidence of mitral stenosis.  9. The tricuspid valve is normal in structure. Tricuspid valve regurgitation is mild. 10. The aortic valve is tricuspid. Aortic valve regurgitation is not visualized. No evidence of aortic valve sclerosis or stenosis. 11. The pulmonic valve was normal in structure. Pulmonic valve regurgitation is not visualized. 12. Normal pulmonary artery systolic pressure. 13. The inferior vena cava is normal in size with greater than 50% respiratory variability, suggesting right atrial pressure of 3 mmHg. 14. Left ventricular diastolic parameters are consistent with Grade I diastolic dysfunction (impaired relaxation). FINDINGS  Left Ventricle: Left ventricular ejection fraction, by visual estimation, is <20%. The left ventricle has severely decreased function. Definity contrast agent was given IV to delineate the left ventricular endocardial borders. Mildly increased left ventricular posterior wall thickness. Left ventricular diastolic  parameters are consistent with Grade I diastolic dysfunction (impaired relaxation). There is a moderate, fixed, apical left ventricular thrombus. The thrombus appears flat (mural) in shape.  There is apical ballooning with akinesis of the entire apical wall. There is akinesis of the mid infero and anteroseptum. There is akinesis of the mid and apical inferior, anterior and lateral walls and akineseis of the apical inferolateral wall. Right Ventricle: The right ventricular size is normal. No increase in right ventricular wall thickness. Global RV systolic function is has normal systolic function. The tricuspid regurgitant velocity is 2.47 m/s, and with an assumed right atrial pressure  of 3 mmHg, the estimated right ventricular systolic pressure is normal at 27.4 mmHg. Left Atrium: Left atrial size was normal in size. Right Atrium: Right atrial size was normal in size Pericardium: There is no evidence of pericardial effusion. Mitral Valve: The mitral valve is normal in structure. Trivial mitral valve regurgitation. No evidence of mitral valve stenosis by observation. Tricuspid Valve: The tricuspid valve is normal in structure. Tricuspid valve regurgitation is mild. Aortic Valve: The aortic valve is tricuspid. . There is moderate thickening and moderate calcification of the aortic valve. Aortic valve regurgitation is not visualized. The aortic valve is structurally normal, with no evidence of sclerosis or stenosis. There is moderate thickening of the aortic valve. There is moderate calcification of the aortic valve. Pulmonic Valve: The pulmonic valve was normal in structure. Pulmonic valve regurgitation is not visualized. Pulmonic regurgitation is not visualized. Aorta: The aortic root, ascending aorta and aortic arch are all structurally normal, with no evidence of dilitation or obstruction. Venous: The inferior vena cava is normal in size with greater than 50% respiratory variability, suggesting right atrial  pressure of 3 mmHg. IAS/Shunts: No atrial level shunt detected by color flow Doppler. There is no evidence of a patent foramen ovale. No ventricular septal defect is seen or detected. There is no evidence of an atrial septal defect.  LEFT VENTRICLE PLAX 2D LVIDd:         4.20 cm  Diastology LVIDs:         3.00 cm  LV e' lateral: 13.40  cm/s LV PW:         1.20 cm  LV e' medial:  6.20 cm/s LV IVS:        1.20 cm LVOT diam:     1.90 cm LV SV:         44 ml LV SV Index:   20.45 LVOT Area:     2.84 cm  RIGHT VENTRICLE RV S prime:     13.30 cm/s TAPSE (M-mode): 2.3 cm LEFT ATRIUM             Index       RIGHT ATRIUM           Index LA diam:        3.40 cm 1.61 cm/m  RA Area:     12.10 cm LA Vol (A2C):   56.0 ml 26.57 ml/m RA Volume:   28.80 ml  13.66 ml/m LA Vol (A4C):   33.7 ml 15.99 ml/m LA Biplane Vol: 44.3 ml 21.02 ml/m  AORTIC VALVE LVOT Vmax:   54.80 cm/s LVOT Vmean:  41.400 cm/s LVOT VTI:    0.150 m  AORTA Ao Root diam: 2.80 cm TRICUSPID VALVE TR Peak grad:   24.4 mmHg TR Vmax:        247.00 cm/s  SHUNTS Systemic VTI:  0.15 m Systemic Diam: 1.90 cm  Armanda Magic MD Electronically signed by Armanda Magic MD Signature Date/Time: 01/29/2019/4:31:22 AM    Final (Updated)    IR PERCUTANEOUS ART THROMBECTOMY/INFUSION INTRACRANIAL INC DIAG ANGIO  Result Date: 01/30/2019 INDICATION: New onset of right gaze deviation, and left hemiplegia. 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 EXAM: 1. EMERGENT LARGE VESSEL OCCLUSION THROMBOLYSIS (anterior CIRCULATION) COMPARISON:  CT angiogram of the head and neck of January 28, 2019. MEDICATIONS: Ancef 2 g IV was administered within 1 hour of the procedure. ANESTHESIA/SEDATION: General anesthesia. CONTRAST:  Isovue 300 approximately 110 mL. FLUOROSCOPY TIME:  Fluoroscopy Time: 36 minutes 12 seconds (1466 mGy). COMPLICATIONS: None immediate. TECHNIQUE: Following a  full explanation of the procedure along with the potential associated complications, an informed witnessed consent was obtained from the patient in a 3 way phone call. The risks of intracranial hemorrhage of 10%, worsening neurological deficit, ventilator dependency, death and inability to revascularize were all reviewed in detail with the patient's daughter. The patient was then put under general anesthesia by the Department of Anesthesiology at [facility]. The right groin was prepped and draped in the usual sterile fashion. Thereafter using modified Seldinger technique, transfemoral access into the right common femoral artery was obtained without difficulty. Over a 0.035 inch guidewire a 5 French Pinnacle sheath was inserted. Through this, and also over a 0.035 inch guidewire a 5 [place] JB 1 catheter was advanced to the aortic arch region and selectively positioned in the innominate artery, the right common carotid artery and the left common carotid artery. FINDINGS: The innominate artery angiogram demonstrates the origin of the right subclavian artery and the right common carotid artery to be widely patent. The right vertebral artery at its origin demonstrates wide patency. More distally the vessel is seen to opacify to the cranial skull base. On the left lateral projection, the right vertebrobasilar junction and the opacified portion of the basilar artery and the posterior cerebral arteries demonstrate wide patency with free advancement of contrast into the delayed capillary phase. The right common carotid arteriogram demonstrates the right external  carotid artery and its major branches to be widely patent. The right internal carotid artery demonstrated complete angiographic occlusion just distal to the bulb with a flame shape configuration. There is no delayed evidence of string sign. Also no reconstitution of the right internal carotid artery intracranially is seen from the external carotid artery  branches. PROCEDURE: The diagnostic JB 1 catheter in the right common carotid artery was then exchanged over a 0.035 inch 300 cm Rosen exchange guidewire for an 8 [place] Pinnacle sheath in the right groin which was connected to continuous heparinized saline infusion. Over the [facility] exchange guidewire, a 95 cm 087 Walrus balloon guide catheter which had been prepped with 50% contrast and 50% heparinized saline infusion was then advanced and positioned just proximal to the origin of the right internal carotid artery. The guidewire was removed. Good aspiration obtained from the hub of the Walrus balloon guide catheter. A gentle control arteriogram performed through the balloon guide catheter continued to demonstrate no change in the extracranial or intracranial circulations. Over a 0.014 inch standard Synchro micro guidewire with a J configuration, a 132 cm 6 [place] Catalyst guide catheter inside of which was an [employer] microcatheter was advanced with the micro guidewire leading with the J configuration to the supraclinoid segment. The micro guidewire was then gently manipulated with a torque device to enter the right middle cerebral artery into the distal M1 M2 junction. This was then followed by the microcatheter. The micro guidewire was then gently retrieved and removed. There was free aspiration of blood from the hub of the microcatheter. A gentle control arteriogram performed through this demonstrated excellent flow into the MCA bifurcation branches. A 4 mm x 40 mm Solitaire X retrieval device was then deployed with the distal M1 segment, and the proximal portion in the supraclinoid segment. At this time, the Catalyst guide catheter was advanced into the proximal aspect of the clot. With constant aspiration being applied at the hub of the [facility] guide catheter with a 60 mL syringe, and constant aspiration being applied with a Penumbra aspiration device at the hub of the Catalyst guide catheter, for 2  minutes, the combination of the retrieval device, the microcatheter, the 6 [place] Catalyst guide catheter and eventually the balloon guide catheter was retrieved and removed. Copious amounts of soft clot was seen in Tuohy Cushing, the 6 [place] Catalyst guide catheter, and also the Walrus balloon guide catheter. A diagnostic JB 1 catheter advanced into the right common carotid artery demonstrated mild to moderately improved flow in the right internal carotid proximally. There continued be occlusion in the more distal cavernous and supraclinoid segments. The combination was advanced again as a mentioned above. The micro guidewire was advanced into the right middle cerebral artery M1 M2 region followed by the microcatheter. The 6 French Catalyst guidewire was advanced into the occluded proximal portion of the clot. The 4 mm x 40 mm retrieval device was again deployed as above. With aspiration being applied at the hub of the 6 [place] Catalyst guide catheter with a Penumbra aspiration device, and a 60 mL syringe at the hub of the balloon the Catalyst guide catheter was retrieved the to the origin of the right internal carotid artery demonstrated guide catheter for about 2-1/2 minutes, the combination was again retrieved and removed. At this time free aspiration was noted at the hub of the [facility] balloon guide catheter in the mid right internal carotid artery. The aspirate demonstrated copious amounts of dark clot with small  pieces. A control arteriogram performed through the [facility] guide catheter at the origin of the right internal carotid now demonstrated complete angiographic revascularization of the right internal carotid artery extra cranially and intracranially. The right middle cerebral artery and the right anterior cerebral artery also demonstrated complete angiographic revascularization with no evidence of intraluminal filling defects or of occlusions. Spasm was noted in the right internal carotid artery and  responded to 3 aliquots of 25 mcg of nitroglycerin with elevation of the spasm. No evidence of intimal flap was noted. The balloon guide was then retrieved and removed. The 8 French Pinnacle sheath in the right groin was then replaced with an 8 French Angio-Seal closure device with hemostasis. The right groin appeared soft. Distal pulses remained Dopplerable in the dorsalis pedis, and the posterior tibial arteries bilaterally unchanged. The patient was loaded with 81 mg of aspirin, and 180 mg of Brilinta via an orogastric tube. Additionally, the patient was given 7.5 mg of intra-arterial Integrilin in order to prevent formation of platelet aggregates. A flat panel CT of the brain demonstrated no evidence of hemorrhage, mass effect, or midline shift. The patient's general anesthesia was then reversed. Upon recovery, the patient was able to obey simple commands with movement of the right upper and right lower extremities. Left over left demonstrated more activity. Patient was then transferred to the neuro ICU to continue with post thrombectomy management. IMPRESSION: Status post endovascular complete revascularization of the right internal carotid artery extra cranially and intracranially and partially the right middle cerebral artery proximally at its origin with 2 passes with the Solitaire X 4 x 40 mm retrieval device, and Penumbra aspiration achieving a TICI 3 revascularization of the right middle cerebral artery, and the right anterior cerebral artery distributions. PLAN: As per referring MD. Electronically Signed   By: Julieanne Cotton M.D.   On: 01/29/2019 10:44   CT HEAD CODE STROKE WO CONTRAST  Result Date: 01/28/2019 CLINICAL DATA:  Code stroke. Stroke. Slurred speech left facial droop EXAM: CT HEAD WITHOUT CONTRAST TECHNIQUE: Contiguous axial images were obtained from the base of the skull through the vertex without intravenous contrast. COMPARISON:  CT head 12/11/2017 FINDINGS: Brain: Moderate  atrophy unchanged. Negative for hydrocephalus. Negative for acute cortical infarct, hemorrhage, mass. Hypodensity in the central lower pons could represent artifact versus acute infarct. Vascular: Negative for hyperdense vessel Skull: Negative Sinuses/Orbits: Chronic opacification left maxillary sinus with bony thickening unchanged. Mucosal edema left frontal and ethmoid sinus. Negative orbit. Other: None ASPECTS (Alberta Stroke Program Early CT Score) - Ganglionic level infarction (caudate, lentiform nuclei, internal capsule, insula, M1-M3 cortex): 7 - Supraganglionic infarction (M4-M6 cortex): 3 Total score (0-10 with 10 being normal): 10 IMPRESSION: 1. No acute cortical infarct or hemorrhage. 2. Hypodensity in the pons could be artifact. 3. ASPECTS is 10 4. These results were called by telephone at the time of interpretation on 01/28/2019 at 9:36 pm to provider [facility] , who verbally acknowledged these results. Electronically Signed   By: Marlan Palau M.D.   On: 01/28/2019 21:37   VAS US CAROTID  Result Date: 01/31/2019 Carotid Arterial Duplex Study Indications:       Post thrombectomy. Comparison Study:  No prior study. Performing Technologist: Gertie Fey MHA, RDMS, RVT, RDCS  Examination Guidelines: A complete evaluation includes B-mode imaging, spectral Doppler, color Doppler, and power Doppler as needed of all accessible portions of each vessel. Bilateral testing is considered an integral part of a complete examination. Limited examinations for reoccurring indications may  be performed as noted.  Right Carotid Findings: +----------+--------+--------+--------+-----------------------+--------+           PSV cm/sEDV cm/sStenosisPlaque Description     Comments +----------+--------+--------+--------+-----------------------+--------+ CCA Prox  60      16                                              +----------+--------+--------+--------+-----------------------+--------+ CCA  Distal52      12              smooth and heterogenous         +----------+--------+--------+--------+-----------------------+--------+ ICA Prox  49      11              smooth and heterogenous         +----------+--------+--------+--------+-----------------------+--------+ ICA Distal90      28                                              +----------+--------+--------+--------+-----------------------+--------+ ECA       60      11                                              +----------+--------+--------+--------+-----------------------+--------+ +---------+--------+--+--------+--+---------+ VertebralPSV cm/s49EDV cm/s13Antegrade +---------+--------+--+--------+--+---------+  Summary: Right Carotid: Velocities in the right ICA are consistent with a 1-39% stenosis.                This is a limited carotid study and only right side was studied.  *See table(s) above for measurements and observations.  Electronically signed by Delia Heady MD on 01/31/2019 at 12:36:58 PM.   Final    [place] EKG SITE RITE  Result Date: 01/29/2019 If Site Rite image not attached, placement could not be confirmed due to current cardiac rhythm.  IR ANGIO INTRA EXTRACRAN SEL COM CAROTID INNOMINATE UNI L MOD SED  Result Date: 01/30/2019 INDICATION: New onset of right gaze deviation, and left hemiplegia. 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 EXAM: 1. EMERGENT LARGE VESSEL OCCLUSION THROMBOLYSIS (anterior CIRCULATION) COMPARISON:  CT angiogram of the head and neck of January 28, 2019. MEDICATIONS: Ancef 2 g IV was administered within 1 hour of the procedure. ANESTHESIA/SEDATION: General anesthesia. CONTRAST:  Isovue 300 approximately 110 mL. FLUOROSCOPY TIME:  Fluoroscopy Time: 36 minutes 12 seconds (1466 mGy). COMPLICATIONS: None immediate. TECHNIQUE: Following a full explanation of the  procedure along with the potential associated complications, an informed witnessed consent was obtained from the patient in a 3 way phone call. The risks of intracranial hemorrhage of 10%, worsening neurological deficit, ventilator dependency, death and inability to revascularize were all reviewed in detail with the patient's daughter. The patient was then put under general anesthesia by the Department of Anesthesiology at [facility]. The right groin was prepped and draped in the usual sterile fashion. Thereafter using modified Seldinger technique, transfemoral access into the right common femoral artery was obtained without difficulty. Over a 0.035 inch guidewire a 5 French Pinnacle sheath was inserted. Through this, and also over a 0.035 inch guidewire a 5 [place]  JB 1 catheter was advanced to the aortic arch region and selectively positioned in the innominate artery, the right common carotid artery and the left common carotid artery. FINDINGS: The innominate artery angiogram demonstrates the origin of the right subclavian artery and the right common carotid artery to be widely patent. The right vertebral artery at its origin demonstrates wide patency. More distally the vessel is seen to opacify to the cranial skull base. On the left lateral projection, the right vertebrobasilar junction and the opacified portion of the basilar artery and the posterior cerebral arteries demonstrate wide patency with free advancement of contrast into the delayed capillary phase. The right common carotid arteriogram demonstrates the right external carotid artery and its major branches to be widely patent. The right internal carotid artery demonstrated complete angiographic occlusion just distal to the bulb with a flame shape configuration. There is no delayed evidence of string sign. Also no reconstitution of the right internal carotid artery intracranially is seen from the external carotid artery branches. PROCEDURE: The  diagnostic JB 1 catheter in the right common carotid artery was then exchanged over a 0.035 inch 300 cm Rosen exchange guidewire for an 8 [place] Pinnacle sheath in the right groin which was connected to continuous heparinized saline infusion. Over the [facility] exchange guidewire, a 95 cm 087 [place] balloon guide catheter which had been prepped with 50% contrast and 50% heparinized saline infusion was then advanced and positioned just proximal to the origin of the right internal carotid artery. The guidewire was removed. Good aspiration obtained from the hub of the [place] balloon guide catheter. A gentle control arteriogram performed through the balloon guide catheter continued to demonstrate no change in the extracranial or intracranial circulations. Over a 0.014 inch standard Synchro micro guidewire with a J configuration, a 132 cm 6 [place] Catalyst guide catheter inside of which was an [employer] microcatheter was advanced with the micro guidewire leading with the J configuration to the supraclinoid segment. The micro guidewire was then gently manipulated with a torque device to enter the right middle cerebral artery into the distal M1 M2 junction. This was then followed by the microcatheter. The micro guidewire was then gently retrieved and removed. There was free aspiration of blood from the hub of the microcatheter. A gentle control arteriogram performed through this demonstrated excellent flow into the MCA bifurcation branches. A 4 mm x 40 mm Solitaire X retrieval device was then deployed with the distal M1 segment, and the proximal portion in the supraclinoid segment. At this time, the Catalyst guide catheter was advanced into the proximal aspect of the clot. With constant aspiration being applied at the hub of the [facility] guide catheter with a 60 mL syringe, and constant aspiration being applied with a Penumbra aspiration device at the hub of the Catalyst guide catheter, for 2 minutes, the combination of  the retrieval device, the microcatheter, the 6 [place] Catalyst guide catheter and eventually the balloon guide catheter was retrieved and removed. Copious amounts of soft clot was seen in Tuohy [place], the 6 [place] Catalyst guide catheter, and also the [place] balloon guide catheter. A diagnostic JB 1 catheter advanced into the right common carotid artery demonstrated mild to moderately improved flow in the right internal carotid proximally. There continued be occlusion in the more distal cavernous and supraclinoid segments. The combination was advanced again as a mentioned above. The micro guidewire was advanced into the right middle cerebral artery M1 M2 region followed by the microcatheter. The 6 [place]  Catalyst guidewire was advanced into the occluded proximal portion of the clot. The 4 mm x 40 mm retrieval device was again deployed as above. With aspiration being applied at the hub of the 6 [place] Catalyst guide catheter with a Penumbra aspiration device, and a 60 mL syringe at the hub of the balloon the Catalyst guide catheter was retrieved the to the origin of the right internal carotid artery demonstrated guide catheter for about 2-1/2 minutes, the combination was again retrieved and removed. At this time free aspiration was noted at the hub of the [facility] balloon guide catheter in the mid right internal carotid artery. The aspirate demonstrated copious amounts of dark clot with small pieces. A control arteriogram performed through the [facility] guide catheter at the origin of the right internal carotid now demonstrated complete angiographic revascularization of the right internal carotid artery extra cranially and intracranially. The right middle cerebral artery and the right anterior cerebral artery also demonstrated complete angiographic revascularization with no evidence of intraluminal filling defects or of occlusions. Spasm was noted in the right internal carotid artery and responded to 3 aliquots of 25 mcg  of nitroglycerin with elevation of the spasm. No evidence of intimal flap was noted. The balloon guide was then retrieved and removed. The 8 French Pinnacle sheath in the right groin was then replaced with an 8 French Angio-Seal closure device with hemostasis. The right groin appeared soft. Distal pulses remained Dopplerable in the dorsalis pedis, and the posterior tibial arteries bilaterally unchanged. The patient was loaded with 81 mg of aspirin, and 180 mg of Brilinta via an orogastric tube. Additionally, the patient was given 7.5 mg of intra-arterial Integrilin in order to prevent formation of platelet aggregates. A flat panel CT of the brain demonstrated no evidence of hemorrhage, mass effect, or midline shift. The patient's general anesthesia was then reversed. Upon recovery, the patient was able to obey simple commands with movement of the right upper and right lower extremities. Left over left demonstrated more activity. Patient was then transferred to the neuro ICU to continue with post thrombectomy management. IMPRESSION: Status post endovascular complete revascularization of the right internal carotid artery extra cranially and intracranially and partially the right middle cerebral artery proximally at its origin with 2 passes with the Solitaire X 4 x 40 mm retrieval device, and Penumbra aspiration achieving a TICI 3 revascularization of the right middle cerebral artery, and the right anterior cerebral artery distributions. PLAN: As per referring MD. Electronically Signed   By: Julieanne Cotton M.D.   On: 01/29/2019 10:44   IR ANGIO VERTEBRAL SEL SUBCLAVIAN INNOMINATE UNI R MOD SED  Result Date: 01/30/2019 INDICATION: New onset of right gaze deviation, and left hemiplegia. 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 EXAM: 1. EMERGENT LARGE VESSEL OCCLUSION THROMBOLYSIS (anterior  CIRCULATION) COMPARISON:  CT angiogram of the head and neck of January 28, 2019. MEDICATIONS: Ancef 2 g IV was administered within 1 hour of the procedure. ANESTHESIA/SEDATION: General anesthesia. CONTRAST:  Isovue 300 approximately 110 mL. FLUOROSCOPY TIME:  Fluoroscopy Time: 36 minutes 12 seconds (1466 mGy). COMPLICATIONS: None immediate. TECHNIQUE: Following a full explanation of the procedure along with the potential associated complications, an informed witnessed consent was obtained from the patient in a 3 way phone call. The risks of intracranial hemorrhage of 10%, worsening neurological deficit, ventilator dependency, death and inability to revascularize were all reviewed in detail with the  patient's daughter. The patient was then put under general anesthesia by the Department of Anesthesiology at [facility]. The right groin was prepped and draped in the usual sterile fashion. Thereafter using modified Seldinger technique, transfemoral access into the right common femoral artery was obtained without difficulty. Over a 0.035 inch guidewire a 5 French Pinnacle sheath was inserted. Through this, and also over a 0.035 inch guidewire a 5 [place] JB 1 catheter was advanced to the aortic arch region and selectively positioned in the innominate artery, the right common carotid artery and the left common carotid artery. FINDINGS: The innominate artery angiogram demonstrates the origin of the right subclavian artery and the right common carotid artery to be widely patent. The right vertebral artery at its origin demonstrates wide patency. More distally the vessel is seen to opacify to the cranial skull base. On the left lateral projection, the right vertebrobasilar junction and the opacified portion of the basilar artery and the posterior cerebral arteries demonstrate wide patency with free advancement of contrast into the delayed capillary phase. The right common carotid arteriogram demonstrates the right  external carotid artery and its major branches to be widely patent. The right internal carotid artery demonstrated complete angiographic occlusion just distal to the bulb with a flame shape configuration. There is no delayed evidence of string sign. Also no reconstitution of the right internal carotid artery intracranially is seen from the external carotid artery branches. PROCEDURE: The diagnostic JB 1 catheter in the right common carotid artery was then exchanged over a 0.035 inch 300 cm Rosen exchange guidewire for an 8 [place] Pinnacle sheath in the right groin which was connected to continuous heparinized saline infusion. Over the [facility] exchange guidewire, a 95 cm 087 [place] balloon guide catheter which had been prepped with 50% contrast and 50% heparinized saline infusion was then advanced and positioned just proximal to the origin of the right internal carotid artery. The guidewire was removed. Good aspiration obtained from the hub of the [place] balloon guide catheter. A gentle control arteriogram performed through the balloon guide catheter continued to demonstrate no change in the extracranial or intracranial circulations. Over a 0.014 inch standard Synchro micro guidewire with a J configuration, a 132 cm 6 [place] Catalyst guide catheter inside of which was an [employer] microcatheter was advanced with the micro guidewire leading with the J configuration to the supraclinoid segment. The micro guidewire was then gently manipulated with a torque device to enter the right middle cerebral artery into the distal M1 M2 junction. This was then followed by the microcatheter. The micro guidewire was then gently retrieved and removed. There was free aspiration of blood from the hub of the microcatheter. A gentle control arteriogram performed through this demonstrated excellent flow into the MCA bifurcation branches. A 4 mm x 40 mm Solitaire X retrieval device was then deployed with the distal M1 segment, and the  proximal portion in the supraclinoid segment. At this time, the Catalyst guide catheter was advanced into the proximal aspect of the clot. With constant aspiration being applied at the hub of the [facility] guide catheter with a 60 mL syringe, and constant aspiration being applied with a Penumbra aspiration device at the hub of the Catalyst guide catheter, for 2 minutes, the combination of the retrieval device, the microcatheter, the 6 [place] Catalyst guide catheter and eventually the balloon guide catheter was retrieved and removed. Copious amounts of soft clot was seen in Tuohy [place], the 6 [place] Catalyst guide catheter, and  also the [facility] balloon guide catheter. A diagnostic JB 1 catheter advanced into the right common carotid artery demonstrated mild to moderately improved flow in the right internal carotid proximally. There continued be occlusion in the more distal cavernous and supraclinoid segments. The combination was advanced again as a mentioned above. The micro guidewire was advanced into the right middle cerebral artery M1 M2 region followed by the microcatheter. The 6 French Catalyst guidewire was advanced into the occluded proximal portion of the clot. The 4 mm x 40 mm retrieval device was again deployed as above. With aspiration being applied at the hub of the 6 [place] Catalyst guide catheter with a Penumbra aspiration device, and a 60 mL syringe at the hub of the balloon the Catalyst guide catheter was retrieved the to the origin of the right internal carotid artery demonstrated guide catheter for about 2-1/2 minutes, the combination was again retrieved and removed. At this time free aspiration was noted at the hub of the [facility] balloon guide catheter in the mid right internal carotid artery. The aspirate demonstrated copious amounts of dark clot with small pieces. A control arteriogram performed through the [facility] guide catheter at the origin of the right internal carotid now demonstrated complete  angiographic revascularization of the right internal carotid artery extra cranially and intracranially. The right middle cerebral artery and the right anterior cerebral artery also demonstrated complete angiographic revascularization with no evidence of intraluminal filling defects or of occlusions. Spasm was noted in the right internal carotid artery and responded to 3 aliquots of 25 mcg of nitroglycerin with elevation of the spasm. No evidence of intimal flap was noted. The balloon guide was then retrieved and removed. The 8 French Pinnacle sheath in the right groin was then replaced with an 8 French Angio-Seal closure device with hemostasis. The right groin appeared soft. Distal pulses remained Dopplerable in the dorsalis pedis, and the posterior tibial arteries bilaterally unchanged. The patient was loaded with 81 mg of aspirin, and 180 mg of Brilinta via an orogastric tube. Additionally, the patient was given 7.5 mg of intra-arterial Integrilin in order to prevent formation of platelet aggregates. A flat panel CT of the brain demonstrated no evidence of hemorrhage, mass effect, or midline shift. The patient's general anesthesia was then reversed. Upon recovery, the patient was able to obey simple commands with movement of the right upper and right lower extremities. Left over left demonstrated more activity. Patient was then transferred to the neuro ICU to continue with post thrombectomy management. IMPRESSION: Status post endovascular complete revascularization of the right internal carotid artery extra cranially and intracranially and partially the right middle cerebral artery proximally at its origin with 2 passes with the Solitaire X 4 x 40 mm retrieval device, and Penumbra aspiration achieving a TICI 3 revascularization of the right middle cerebral artery, and the right anterior cerebral artery distributions. PLAN: As per referring MD. Electronically Signed   By: Julieanne Cotton M.D.   On: 01/29/2019  10:44   PHYSICAL EXAM    General - frail elderly caucasian male in mild distress.  Ophthalmologic - fundi not visualized due to noncooperation.  Cardiovascular - Regular rhythm and rate, not in afib.  Neuro- drowsy sleepy, open eyes on voice, orientated to self, age, place, but not to time or situation. Able to name 2/2, able to repeat simple sentences. Able to follow simple commands. No gaze deviation, no hemianopia. Tracking on both sides, PERRL. Mild left facial droop. Tongue midline. Left UE 4/5, no drift.  LLE proximal 4/5, distal 5/5 with toe DF and PF. RUE and RLE 5/5. Sensation symmtrical subjectively. FTN intact on the right but dysmetria on the left. Gait not tested.   ASSESSMENT/PLAN Adam Leon is a 85 y.o. male with history of HLD, HTN, DM presenting after a fall with L sided weakness face, arm and leg.   Stroke: bilateral anterior and posterior infarcts with L ICA occlusion s/p IR w TICI3 revascularization, likely embolic d/t new AF w/ RVR and LV thrombus in the setting of undiagnosed recent MI  Code Stroke CT head No acute abnormality. pontiue hypodensity. ASPECTS 10.     CTA head & neck R ICA occlusion. Decreased R MCA flow w/ good cross-filling.  CT perfusion 13 mL core infarct R watershed w/ delayed perfusion R MCA d/t R ICA occlusion   Cerebral Angio occlusion intra and extracranial ICA with TICI3 reperfusion    MRI  Scattered small cerebrum and cerebellar bilateral infarcts (greatest R frontoparietal)  MRA  Limited unremarkable  Carotid Doppler  R 1-39% stenosis    2D Echo severe LV dysfunction < 20% w/ possible stress cardiomyopathy w/ apical ballooning and LV thrombus  TG 244, HDL < 10  Direct LDL - 58.5  HgbA1c 8.8  warfarin for VTE prophylaxis -> add Eliquis once INR 2-3 (now 4.4)   aspirin 81 mg daily prior to admission, treated with warfarin in hospital but difficulty with INR goal. Will transition to Eliquis once INR 2-3 (now 4.4)    Therapy recommendations: SNF  Disposition:  pending   PAF w/ RVR, new diagnosis  Home anticoagulation:  none   CHA2DS2-VASc Score = at least 6, ?2 oral anticoagulation recommended  Age in Years:  ?21   +2    Sex:  Male   0    Hypertension History:  yes   +1     Diabetes Mellitus:  yes   +1  Congestive Heart Failure History:  0  Vascular Disease History:  yes   +1     Stroke/TIA/Thromboembolism History:  yes   +2 . Started on amiodarone drip w/ bolus due to RVR -> now po amiodarone . Treated with coumadin in hospital, difficulty with INR, warfarin now stopped, add Eliquis when INR  2-3  . INR 2.3->3.2->4.4  Abnormal EKG w/ ST elevation LV Systolic Dysunction, LV thrombus  Felt likely reflective of ICA occlusion w/ demand ischemia  EKG w/ concern for anterior MI  Trop 15519->15715->11893   2D Echo severe LV dysfunction < 20% w/ possible stress cardiomyopathy w/ apical ballooning and LV thrombus  No BB d/t low BP  BNP 4321.1->2,302->2613.1   Probable anterolateral MI prior to admission per cardiology  Cardiology asked to see today for d/c recommendations. Dr. Herbie Baltimore    Acute Respiratory failure   CCM signed off 01/31/19  Recurrent episode of respiratory distress  sats to 80s on 2L w/ wheezing 12/25 during the night  Likely d/t volume overload  Treated w/ lasix  Repeat CXR 12/25 w/ some edema, ? PNA  Repeat CXR 12/27 Improved but persistent bilateral interstitial and airspace disease  Continue maintenance lasix     Cardiology seen for d/c recommendations. Dr. Herbie [place] recommend hold Lasix if SBP less than 100  Hypotension Hx Hypertension  Home meds:  atenolol 25 daily, lisinopril 5  Low BP felt to be related to AF in setting of severe LV dysfunction  BP goal 110-140  off levophed   BP 80-90s this am, decreased po intake - will  put on gentle hydration @ 30cc  Hold lasix if SBP < 100  Hyperlipidemia  Home meds:  pravachol 40  TG 244 and HDL  < 10  Direct LDL - 58.5, goal < 70  AST 33, ALT 49 on 02/02/19  On lipitor 40  Continue lipitor on discharge   Diabetes type II Uncontrolled  Home meds:  Metformin 500 bid  HgbA1c 8.8, goal < 7.0  CBGs  SSI  Close PCP follow up   UGIB, resolved - ? Stress ulcer  Coffee ground color emesis - resolved   Off NG suction  PPI IV Q12h  On diet now  Leukocytosis   WBC 10.1->10.4->20.8->11.9->10.9->20.2  Repeat UA pending  CXR pulmonary edema, possible pleural effusion  AKI on CKD IIIA  Cre 1.57->1.48->1.76->1.60->1.53->1.64  Decreased po intake  Gentle hydration @ 30cc  BMP monitoring  Other Stroke Risk Factors  Advanced age  Other Active Problems  Mild cognitive decline at baseline suspect this is now aggravated by his stroke, heart failure and sundowning.- agitation early AM 01/31/19. Improved today  Physical deconditioning over past 2 weeks PTA going from independent walking to using a walker. Had ER eval 2 wks PTA for fatigue and decreased appetite.   Hypokalemia, resolved  Hospital day # 12  I spent  35 minutes in total face-to-face time with the patient, more than 50% of which was spent in counseling and coordination of care, reviewing test results, images and medication, and discussing the diagnosis of severe CHF, hypotension, stroke, MI, AKI, leukocytosis, lethargy, treatment plan and potential prognosis. This patient's care requiresreview of multiple databases, neurological assessment, discussion with family, other specialists and medical decision making of high complexity. I had long discussion with daughter at bedside, updated pt current condition, treatment plan and potential prognosis, and answered all the questions.  Daughter expressed understanding and appreciation.    Marvel Plan, MD PhD Stroke Neurology 02/09/2019 12:31 PM    To contact Stroke Continuity provider, please refer to [email or web address]. After hours, contact General Neurology

## 2019-02-09 NOTE — Progress Notes (Signed)
Adam Leon for Heparin/Coumadin Indication: S/P carotid embolectomy, and LV thrombus   Patient Measurements: Height: 6' (182.9 cm) Weight: 195 lb (88.5 kg) IBW/kg (Calculated) : 77.6 Heparin weight: 88.5 kg  Vital Signs: Temp: 99 F (37.2 C) (12/28 0824) Temp Source: Axillary (12/28 0824) BP: 86/52 (12/28 0824) Pulse Rate: 90 (12/28 0824)  Labs: Recent Labs    02/08/19 0352 02/09/19 0739  HGB 12.2*  --   HCT 38.6*  --   PLT 433*  --   LABPROT 32.5* 41.7*  INR 3.2* 4.4*  CREATININE 1.53*  --      Assessment: 85 y.o. male with EKG changes, elevated cardiac markers and LV thrombus, S/P CVA and carotid thrombectomy, patient was started on warfarin but his INR continues to fluctuate rapidly from subtherapeutic to supratherapeutic. Today his INR is 4.4 - supratherapeutic.    Goal of Therapy:  INR 2-3 Monitor platelets by anticoagulation protocol: Yes     Plan:   -Hold warfarin, hold apixaban -Will initiate apixaban when INR falls to ~ 2    Harvel Quale 02/09/2019 11:36 AM

## 2019-02-10 ENCOUNTER — Inpatient Hospital Stay (HOSPITAL_COMMUNITY): Payer: Medicare HMO

## 2019-02-10 DIAGNOSIS — I4821 Permanent atrial fibrillation: Secondary | ICD-10-CM

## 2019-02-10 LAB — BASIC METABOLIC PANEL
Anion gap: 13 (ref 5–15)
BUN: 28 mg/dL — ABNORMAL HIGH (ref 8–23)
CO2: 27 mmol/L (ref 22–32)
Calcium: 8.2 mg/dL — ABNORMAL LOW (ref 8.9–10.3)
Chloride: 98 mmol/L (ref 98–111)
Creatinine, Ser: 1.48 mg/dL — ABNORMAL HIGH (ref 0.61–1.24)
GFR calc Af Amer: 49 mL/min — ABNORMAL LOW (ref >60)
GFR calc non Af Amer: 42 mL/min — ABNORMAL LOW (ref >60)
Glucose, Bld: 203 mg/dL — ABNORMAL HIGH (ref 70–99)
Potassium: 3.9 mmol/L (ref 3.5–5.1)
Sodium: 138 mmol/L (ref 135–145)

## 2019-02-10 LAB — CBC
HCT: 38.5 % — ABNORMAL LOW (ref 39.0–52.0)
Hemoglobin: 12.2 g/dL — ABNORMAL LOW (ref 13.0–17.0)
MCH: 27.4 pg (ref 26.0–34.0)
MCHC: 31.7 g/dL (ref 30.0–36.0)
MCV: 86.5 fL (ref 80.0–100.0)
Platelets: 398 10*3/uL (ref 150–400)
RBC: 4.45 MIL/uL (ref 4.22–5.81)
RDW: 14.6 % (ref 11.5–15.5)
WBC: 22 10*3/uL — ABNORMAL HIGH (ref 4.0–10.5)
nRBC: 0 % (ref 0.0–0.2)

## 2019-02-10 LAB — PROTIME-INR
INR: 2.9 — ABNORMAL HIGH (ref 0.8–1.2)
Prothrombin Time: 30.4 seconds — ABNORMAL HIGH (ref 11.4–15.2)

## 2019-02-10 MED ORDER — HALOPERIDOL LACTATE 5 MG/ML IJ SOLN: 0.5000 mg | INTRAMUSCULAR | Status: DC | PRN

## 2019-02-10 MED ORDER — APIXABAN 5 MG PO TABS: 5.0000 mg | ORAL_TABLET | Freq: Two times a day (BID) | ORAL | Status: DC

## 2019-02-10 MED ORDER — ACETAMINOPHEN 650 MG RE SUPP: 650.0000 mg | Freq: Four times a day (QID) | RECTAL | Status: DC | PRN

## 2019-02-10 MED ORDER — MORPHINE SULFATE (PF) 2 MG/ML IV SOLN
1.0000 mg | INTRAVENOUS | Status: DC | PRN
  Administered 2019-02-10 – 2019-02-11 (×2): 1 mg via INTRAVENOUS
  Filled 2019-02-10 (×2): qty 1

## 2019-02-10 MED ORDER — ACETAMINOPHEN 325 MG PO TABS: 650.0000 mg | ORAL_TABLET | Freq: Four times a day (QID) | ORAL | Status: DC | PRN

## 2019-02-10 MED ORDER — ONDANSETRON HCL 4 MG/2ML IJ SOLN: 4.0000 mg | Freq: Four times a day (QID) | INTRAMUSCULAR | Status: DC | PRN

## 2019-02-10 MED ORDER — GLYCOPYRROLATE 0.2 MG/ML IJ SOLN
0.2000 mg | INTRAMUSCULAR | Status: DC | PRN
  Filled 2019-02-10: qty 1

## 2019-02-10 MED ORDER — GLYCOPYRROLATE 1 MG PO TABS
1.0000 mg | ORAL_TABLET | ORAL | Status: DC | PRN
  Filled 2019-02-10: qty 1

## 2019-02-10 MED ORDER — HALOPERIDOL LACTATE 2 MG/ML PO CONC
0.5000 mg | ORAL | Status: DC | PRN
  Filled 2019-02-10: qty 0.3

## 2019-02-10 MED ORDER — BIOTENE DRY MOUTH MT LIQD: 15.0000 mL | OROMUCOSAL | Status: DC | PRN

## 2019-02-10 MED ORDER — ONDANSETRON 4 MG PO TBDP: 4.0000 mg | ORAL_TABLET | Freq: Four times a day (QID) | ORAL | Status: DC | PRN

## 2019-02-10 MED ORDER — GLYCOPYRROLATE 0.2 MG/ML IJ SOLN: 0.2000 mg | INTRAMUSCULAR | Status: DC | PRN

## 2019-02-10 MED ORDER — MORPHINE SULFATE (PF) 2 MG/ML IV SOLN
2.0000 mg | Freq: Once | INTRAVENOUS | Status: AC
  Administered 2019-02-10: 2 mg via INTRAVENOUS
  Filled 2019-02-10: qty 1

## 2019-02-10 MED ORDER — POLYVINYL ALCOHOL 1.4 % OP SOLN
1.0000 [drp] | Freq: Four times a day (QID) | OPHTHALMIC | Status: DC | PRN
  Filled 2019-02-10: qty 15

## 2019-02-10 MED ORDER — HALOPERIDOL 0.5 MG PO TABS
0.5000 mg | ORAL_TABLET | ORAL | Status: DC | PRN
  Filled 2019-02-10: qty 1

## 2019-02-10 MED ORDER — SODIUM CHLORIDE 0.9 % IV SOLN
1.0000 g | INTRAVENOUS | Status: DC
  Administered 2019-02-10: 1 g via INTRAVENOUS
  Filled 2019-02-10: qty 10

## 2019-02-10 NOTE — Progress Notes (Signed)
[place] for Eliquis Indication: S/P carotid embolectomy, and LV thrombus   Patient Measurements: Height: 6' (182.9 cm) Weight: 195 lb (88.5 kg) IBW/kg (Calculated) : 77.6 Heparin weight: 88.5 kg  Vital Signs: Temp: 97.6 F (36.4 C) (12/29 1255) Temp Source: Oral (12/29 1255) BP: 89/53 (12/29 1255) Pulse Rate: 81 (12/29 1255)  Labs: Recent Labs    02/08/19 0352 02/09/19 0739 02/09/19 1308 02/10/19 0304  HGB 12.2*  --  12.0* 12.2*  HCT 38.6*  --  38.0* 38.5*  PLT 433*  --  406* 398  LABPROT 32.5* 41.7*  --  30.4*  INR 3.2* 4.4*  --  2.9*  CREATININE 1.53*  --  1.64* 1.48*     Assessment: 85 y.o. male with EKG changes, elevated cardiac markers and LV thrombus, S/P CVA and carotid thrombectomy, patient was started on warfarin but his INR continues to fluctuate rapidly from subtherapeutic to supratherapeutic. Today his INR is therapeutic at 2.9. Planning to change patient to Eliquis, will skip the initial 10 mg BID load since patient was on warfarin for the past 11 days with mostly therapeutic INRs.   Goal of Therapy:  INR 2-3 Monitor platelets by anticoagulation protocol: Yes     Plan:   - Will initiate Eliquis tomorrow in anticipation of INR continuing to fall  - Eliquis 5 mg po bid    Harvel Quale 02/10/2019 1:13 PM

## 2019-02-10 NOTE — Progress Notes (Signed)
STROKE TEAM PROGRESS NOTE   INTERVAL HISTORY Daughter at bedside. Pt still lethargic, eyes closed, intermittent eye opening, speech incoherent. Had Cheyne stokes breathing pattern with myoclonus when fall into asleep. BP continues at 80s, on IVF @ 30cc, seems to have mild SOB. IVF discontinued  Discussed with daughter long term at bedside. Family now decided with comfort care measures with plan to get pt home for home hospice.    IMAGES past 48h DG CHEST PORT 1 VIEW  Result Date: 02/10/2019 CLINICAL DATA:  CHF. EXAM: PORTABLE CHEST 1 VIEW COMPARISON:  1 day prior FINDINGS: Midline trachea. Borderline cardiomegaly. Probable layering bilateral pleural effusions. No pneumothorax. Interstitial prominence and indistinctness, slightly increased. Concurrent right-sided airspace disease, most confluent in the right upper lobe, slightly increased. IMPRESSION: Worsened aeration secondary to mildly increased interstitial edema and primarily right-sided airspace disease. This could represent alveolar edema or concurrent infection. Probable layering small bilateral pleural effusions. Electronically Signed   By: Jeronimo Greaves M.D.   On: 02/10/2019 09:25   DG CHEST PORT 1 VIEW  Result Date: 02/09/2019 CLINICAL DATA:  Chest pain EXAM: PORTABLE CHEST 1 VIEW COMPARISON:  February 08, 2019 FINDINGS: The heart size remains enlarged. Again noted are perihilar hazy ground-glass airspace opacities. There is no pneumothorax. There are bilateral pleural effusions, small to moderate size. There is a dense retrocardiac opacity. There is no acute osseous abnormality. IMPRESSION: 1. Cardiomegaly with findings suspicious for pulmonary edema. An atypical infectious process is difficult to exclude. 2. Bilateral pleural effusions. 3. Retrocardiac opacity favored to represent atelectasis. Electronically Signed   By: Katherine Mantle M.D.   On: 02/09/2019 17:29    PHYSICAL EXAM  General - frail elderly caucasian male in mild  distress.  Ophthalmologic - fundi not visualized due to noncooperation.  Cardiovascular - Regular rhythm and rate, not in afib.  Neuro- drowsy sleepy, eyes closed but able to briefly open eyes on voice, not orientated to self, place, or time. Lethargic not name or repeat. Not follow simple commands. Speech incoherent and difficulty to understand. No gaze deviation, attending to both side. PERRL. Mild left facial droop. Tongue midline. Left UE 4/5, no drift. LLE proximal 4/5, distal 5/5 with toe DF and PF. RUE and RLE 5/5. Sensation symmtrical subjectively. FTN not cooperative. Gait not tested.   ASSESSMENT/PLAN [name] is a 85 y.o. male with history of HLD, HTN, DM presenting after a fall with L sided weakness face, arm and leg.   Stroke: bilateral anterior and posterior infarcts with L ICA occlusion s/p IR w TICI3 revascularization, likely embolic d/t new AF w/ RVR and LV thrombus in the setting of undiagnosed recent MI  Code Stroke CT head No acute abnormality. pontiue hypodensity. ASPECTS 10.     CTA head & neck R ICA occlusion. Decreased R MCA flow w/ good cross-filling.  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 Plan to transition to Eliquis once INR < 2.0 - now off AC due to comfort care measures  Therapy recommendations: family would like home  hospice   Disposition:  pending   PAF w/ RVR, new diagnosis  Home anticoagulation:  none   CHA2DS2-VASc Score = at least 6, ?2 oral anticoagulation recommended  Age in Years:  ?689   +2    Sex:  Male   0    Hypertension History:  yes   +1     Diabetes Mellitus:  yes   +1  Congestive Heart Failure History:  0  Vascular Disease History:  yes   +1     Stroke/TIA/Thromboembolism History:  yes   +2 . Started on amiodarone drip w/ bolus due to RVR -> transition to po amiodarone -> stopped due to comfort care measures . Treated with coumadin in hospital, difficulty with INR, warfarin now stopped, plan to transition to Eliquis 12/30 once INR < 2.0 -> now off AC due to comfort care measures . 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 PNA  Repeat CXR 12/27 Improved but persistent bilateral interstitial and airspace disease  maintenance lasix -> now discontinued due to comfort care measures    Hypotension Hx Hypertension  Home meds:  atenolol 25 daily, lisinopril 5  Low BP felt to be related to AF in setting of severe LV dysfunction  BP goal 110-140  off levophed   BP 80-90s this am, decreased po intake - on gentle hydration @ 30cc -> now off  Hold lasix if SBP < 100 -> off lasix for comfort care measures  Hyperlipidemia  Home meds:  pravachol 40  TG 244  and HDL < 10  Direct LDL - 58.5, goal < 70  AST 33, ALT 49 on 02/02/19  On lipitor 40 -> now off due to comfort care measures  Diabetes type II Uncontrolled  Home meds:  Metformin 500 bid  HgbA1c 8.8, goal < 7.0  UGIB, resolved - ? Stress ulcer  Coffee ground color emesis - resolved   Off NG suction  On diet now - pleasure eating  Leukocytosis, worsening   WBC 10.1->10.4->20.8->11.9->10.9->20.2->22.0   Repeat UA WBC 6-10    CXR pulmonary edema, possible pleural effusion  AKI on CKD IIIA  Cre 1.57->1.48->1.76->1.60->1.53->1.64->1.48   Decreased po intake  Gentle hydration @ 30cc -> now off  Other Stroke Risk Factors  Advanced age  Other Active Problems  Mild cognitive decline at baseline suspect this is now aggravated by his stroke, heart failure and sundowning.- agitation early AM 01/31/19. Improved today  Physical deconditioning over past 2 weeks PTA going from independent walking to using a walker. Had ER eval 2 wks PTA for fatigue and decreased appetite.   Hypokalemia, resolved  Hospital day # 13  I spent  35 minutes in total face-to-face time with the patient, more than 50% of which was spent in counseling and coordination of care, reviewing test results, images and medication, and discussing the diagnosis of severe CHF, hypotension, stroke, MI, AKI, leukocytosis, lethargy, treatment plan and potential prognosis. This patient's care requiresreview of multiple databases, neurological assessment, discussion with family, other specialists and medical decision making of high complexity. I had long discussion with daughter at bedside, updated pt current condition, treatment plan and potential prognosis, and answered all the questions.  Daughter expressed understanding and appreciation. She discussed with family and requested comfort care measures.  Rosalin Hawking, MD PhD Stroke Neurology 02/10/2019 2:42 PM    To contact Stroke Continuity provider, please refer  to [email or web address] After hours, contact General Neurology

## 2019-02-10 NOTE — Plan of Care (Signed)
Patient is progressing towards care plan goals. 

## 2019-02-10 NOTE — TOC Progression Note (Signed)
Transition of Care [facility]) - Progression Note    Patient Details  Name: Adam Leon MRN: [id number] Date of Birth: 10-09-1930  Transition of Care [facility]) CM/SW [place], [place] Phone Number: 02/10/2019, 10:29 AM  Clinical Narrative:   CSW spoke with daughter, Brenton Grills, to follow up after she was given bed offers. Laureen had asked about [facility], and CSW explained that's an ALF instead of SNF, and family doesn't want him in a long term care facility. Laureen discussed concern with the facilities that had offered, and CSW explained barrier of limited options due to patient's insurance. Laureen would like to consider taking the patient home instead. CSW explained home health services, and Laureen would like to discuss when she arrives to the hospital. CSW asked MD to reorder therapies for re-evaluation to determine equipment needs for family to take the patient home. CSW to follow.    Expected Discharge Plan: [place] Barriers to Discharge: Continued Medical Work up, Ship broker  Expected Discharge Plan and Services Expected Discharge Plan: [place] In-house Referral: Clinical Social Work, Hospice / Palliative Care Discharge Planning Services: NA Post Acute Care Choice: [place] Living arrangements for the past 2 months: Single Family Home                 DME Arranged: N/A DME Agency: NA       HH Arranged: NA HH Agency: NA         Social Determinants of Health (SDOH) Interventions    Readmission Risk Interventions No flowsheet data found.

## 2019-02-10 NOTE — Progress Notes (Addendum)
Hydrologist [facility])  Hospital Liaison: RN note    Notified of patient/family request for [facility] services at home after discharge. Chart and patient information reviewed by [facility] physician. Hospice eligibility confirmed.   Writer spoke with Laureen to initiate education related to hospice philosophy, services and team approach to care. Laureen verbalized understanding of information given.   Per discussion, plan is for discharge to home by PTAR after DME is delivered.   Please send signed and completed DNR form home with patient/family. Patient will need prescriptions for discharge comfort medications.   DME needs have been discussed, patient currently has the following equipment in the home:   Walker, cane. Patient/family requests the following DME for delivery to the home: hospital bed, table and 3N1. [place] equipment manager has been notified and will contact [employer] to arrange delivery to the home. Home address has been verified and is correct in the chart.  Brenton Grills is the family member to contact to arrange time of delivery.    [facility] aware of the above. Please notify [facility] when patient is ready to leave the unit at discharge. (Call [phone] or [phone] after 5pm.) [facility] information and contact numbers given to [place].     Please call with any hospice related questions.    Thank you for this referral.    Farrel Gordon, RN, CCM  [place] (listed on AMION under Hospice and [place] of [place])  [phone]

## 2019-02-10 NOTE — TOC Progression Note (Signed)
Transition of Care [facility]) - Progression Note    Patient Details  Name: Neko Boyajian MRN: [id number] Date of Birth: 12-25-1930  Transition of Care [facility]) CM/SW [employer], [place] Phone Number: 02/10/2019, 3:42 PM  Clinical Narrative:   CSW alerted by MD after discussion with daughter this morning that patient is not doing as well today, wants to discuss hospice with the daughter. CSW met with daughter at bedside with MD to discuss disposition options, considering how the patient had a good weekend but now appears to be deteriorating again. Daughter doesn't want the patient to suffer anymore, but wanted time to discuss with her family to make a decision on what she wanted to do.   CSW notified in the afternoon that daughter had decided to pursue hospice at home. CSW met with daughter again later this afternoon to confirm interest in moving forward with home hospice. Daughter tearful during discussion and said she just wanted to bring the patient home and keep him comfortable. CSW will follow with hospice liaison on when equipment is delivered to the home for discharge to home. CSW alerted MD of daughter's decision and hopeful for discharge tomorrow after equipment is delivered.    Expected Discharge Plan: [place] Barriers to Discharge: Continued Medical Work up, Ship broker  Expected Discharge Plan and Services Expected Discharge Plan: [place] In-house Referral: Clinical Social Work, Hospice / Palliative Care Discharge Planning Services: NA Post Acute Care Choice: [place] Living arrangements for the past 2 months: Single Family Home                 DME Arranged: N/A DME Agency: NA       HH Arranged: NA HH Agency: NA         Social Determinants of Health (SDOH) Interventions    Readmission Risk Interventions No flowsheet data found.

## 2019-02-10 NOTE — Progress Notes (Addendum)
PALLIATIVE NOTE:  Palliative received previous referral for goals of care and hospice on 12/26. Due to high census volume delay in seeing patient.   Per notations family originally was leaning towards hospice facility and/or home with hospice. However decisions changed and family was leaning towards SNF vs home with home health and palliative. With most recent decision per notations that family is now leaning towards home with home health requesting re-evaluation by PT for equipment needs.   Outpatient referral has been placed with [facility] and per Freddie Breech, RN ([place]) on 02/08/19 patient will be set-up for outpatient Palliative program with [facility] on discharge.   Upon review of chart no further needs from inpatient Palliative Medicine as goals appear clear and CM/SW team is working with family on disposition.   Please do not hesitate to contact the Palliative Medicine team if further needs arise at [phone].   Alda Lea, AGPCNP-BC Palliative Medicine Team  Amion: N. [place]   NO CHARGE

## 2019-02-11 DIAGNOSIS — I110 Hypertensive heart disease with heart failure: Secondary | ICD-10-CM | POA: Diagnosis not present

## 2019-02-11 DIAGNOSIS — I69318 Other symptoms and signs involving cognitive functions following cerebral infarction: Secondary | ICD-10-CM | POA: Diagnosis not present

## 2019-02-11 DIAGNOSIS — E785 Hyperlipidemia, unspecified: Secondary | ICD-10-CM | POA: Diagnosis not present

## 2019-02-11 DIAGNOSIS — E119 Type 2 diabetes mellitus without complications: Secondary | ICD-10-CM | POA: Diagnosis not present

## 2019-02-11 DIAGNOSIS — Z515 Encounter for palliative care: Secondary | ICD-10-CM

## 2019-02-11 DIAGNOSIS — I2109 ST elevation (STEMI) myocardial infarction involving other coronary artery of anterior wall: Secondary | ICD-10-CM | POA: Diagnosis not present

## 2019-02-11 DIAGNOSIS — I4891 Unspecified atrial fibrillation: Secondary | ICD-10-CM | POA: Diagnosis not present

## 2019-02-11 DIAGNOSIS — I6529 Occlusion and stenosis of unspecified carotid artery: Secondary | ICD-10-CM | POA: Diagnosis not present

## 2019-02-11 DIAGNOSIS — I69354 Hemiplegia and hemiparesis following cerebral infarction affecting left non-dominant side: Secondary | ICD-10-CM | POA: Diagnosis not present

## 2019-02-11 DIAGNOSIS — K922 Gastrointestinal hemorrhage, unspecified: Secondary | ICD-10-CM | POA: Diagnosis not present

## 2019-02-11 DIAGNOSIS — I5020 Unspecified systolic (congestive) heart failure: Secondary | ICD-10-CM | POA: Diagnosis not present

## 2019-02-11 LAB — URINE CULTURE: Culture: NO GROWTH

## 2019-02-11 MED ORDER — ACETAMINOPHEN 650 MG RE SUPP: 650.0000 mg | Freq: Four times a day (QID) | RECTAL | 0 refills | Status: AC | PRN

## 2019-02-11 MED ORDER — POLYVINYL ALCOHOL 1.4 % OP SOLN: 1.0000 [drp] | Freq: Four times a day (QID) | OPHTHALMIC | 0 refills | Status: AC | PRN

## 2019-02-11 MED ORDER — ACETAMINOPHEN 325 MG PO TABS: 650.0000 mg | ORAL_TABLET | Freq: Four times a day (QID) | ORAL | Status: AC | PRN

## 2019-02-11 MED ORDER — HALOPERIDOL LACTATE 2 MG/ML PO CONC: 0.6000 mg | ORAL | 0 refills | Status: AC | PRN

## 2019-02-11 MED ORDER — ONDANSETRON 4 MG PO TBDP: 4.0000 mg | ORAL_TABLET | Freq: Four times a day (QID) | ORAL | 0 refills | Status: AC | PRN

## 2019-02-11 MED ORDER — GLYCOPYRROLATE 1 MG PO TABS: 1.0000 mg | ORAL_TABLET | ORAL | 0 refills | Status: AC | PRN

## 2019-02-11 MED ORDER — ENSURE ENLIVE PO LIQD: 237.0000 mL | Freq: Two times a day (BID) | ORAL | 12 refills | Status: AC

## 2019-02-11 MED ORDER — OXYCODONE HCL 5 MG PO TABS: 5.0000 mg | ORAL_TABLET | ORAL | 0 refills | Status: AC | PRN

## 2019-02-11 NOTE — TOC Transition Note (Signed)
Transition of Care [facility]) - CM/SW Discharge Note   Patient Details  Name: Adam Leon MRN: [id number] Date of Birth: 10-Mar-1930  Transition of Care [facility]) CM/SW Contact:  Adam Ochs, LCSW Phone Number: 02/11/2019, 12:17 PM   Clinical Narrative:   Patient being discharged home with hospice today. CSW coordinated with daughter, Adam Leon, and [facility] to ensure equipment and care is established for patient to transition home. PTAR pickup has been requested. No further needs identified at this time.     Final next level of care: Home w Hospice Care Barriers to Discharge: Barriers Resolved   Patient Goals and CMS Choice Patient states their goals for this hospitalization and ongoing recovery are:: Family would like the patient to go to rehab before returning home CMS Medicare.gov Compare Post Acute Care list provided to:: Patient Represenative (must comment) Choice offered to / list presented to : Adult Children  Discharge Placement                Patient to be transferred to facility by: [place] Name of family member notified: Adam Leon Patient and family notified of of transfer: 02/11/19  Discharge Plan and Services In-house Referral: Clinical Social Work, Hospice / Palliative Care Discharge Planning Services: NA Post Acute Care Choice: [place]          DME Arranged: N/A DME Agency: NA       HH Arranged: NA HH Agency: NA        Social Determinants of Health (SDOH) Interventions     Readmission Risk Interventions No flowsheet data found.

## 2019-02-11 NOTE — Progress Notes (Signed)
Hydrologist [facility])  Hospital Liaison: RN note    DME is scheduled to deliver at 10:00 this am. Hospital liaison will notify [facility] manager when this is confirmed.  Please call with any hospice related questions.      Farrel Gordon, RN, CCM  [facility] Liaison (listed on AMION under Hospice and [place] [facility] [place])  [phone]

## 2019-02-11 NOTE — Plan of Care (Signed)
Max assist with adls, comfort level.

## 2019-03-16 DEATH — deceased

## 2019-05-12 ENCOUNTER — Other Ambulatory Visit: Payer: Self-pay

## 2019-05-12 NOTE — Patient Outreach (Signed)
First telephone outreach attempt to obtain mRS. CMA spoke with daughter who states patient passed away Mar 01, 2019. mRS=6  CMA sent condolences and shared with daughter call would be documented as well as chart will be marked as deceased.   Baruch Gouty [facility] Management Assistant [phone]

## 2021-06-14 IMAGING — DX DG CHEST 1V PORT
2 series · 2 of 2 positions shown · non-contrast
Comparison: 1 day prior

CLINICAL DATA: CHF.

EXAM:
PORTABLE CHEST 1 VIEW

[chest ap (1 of 2)]
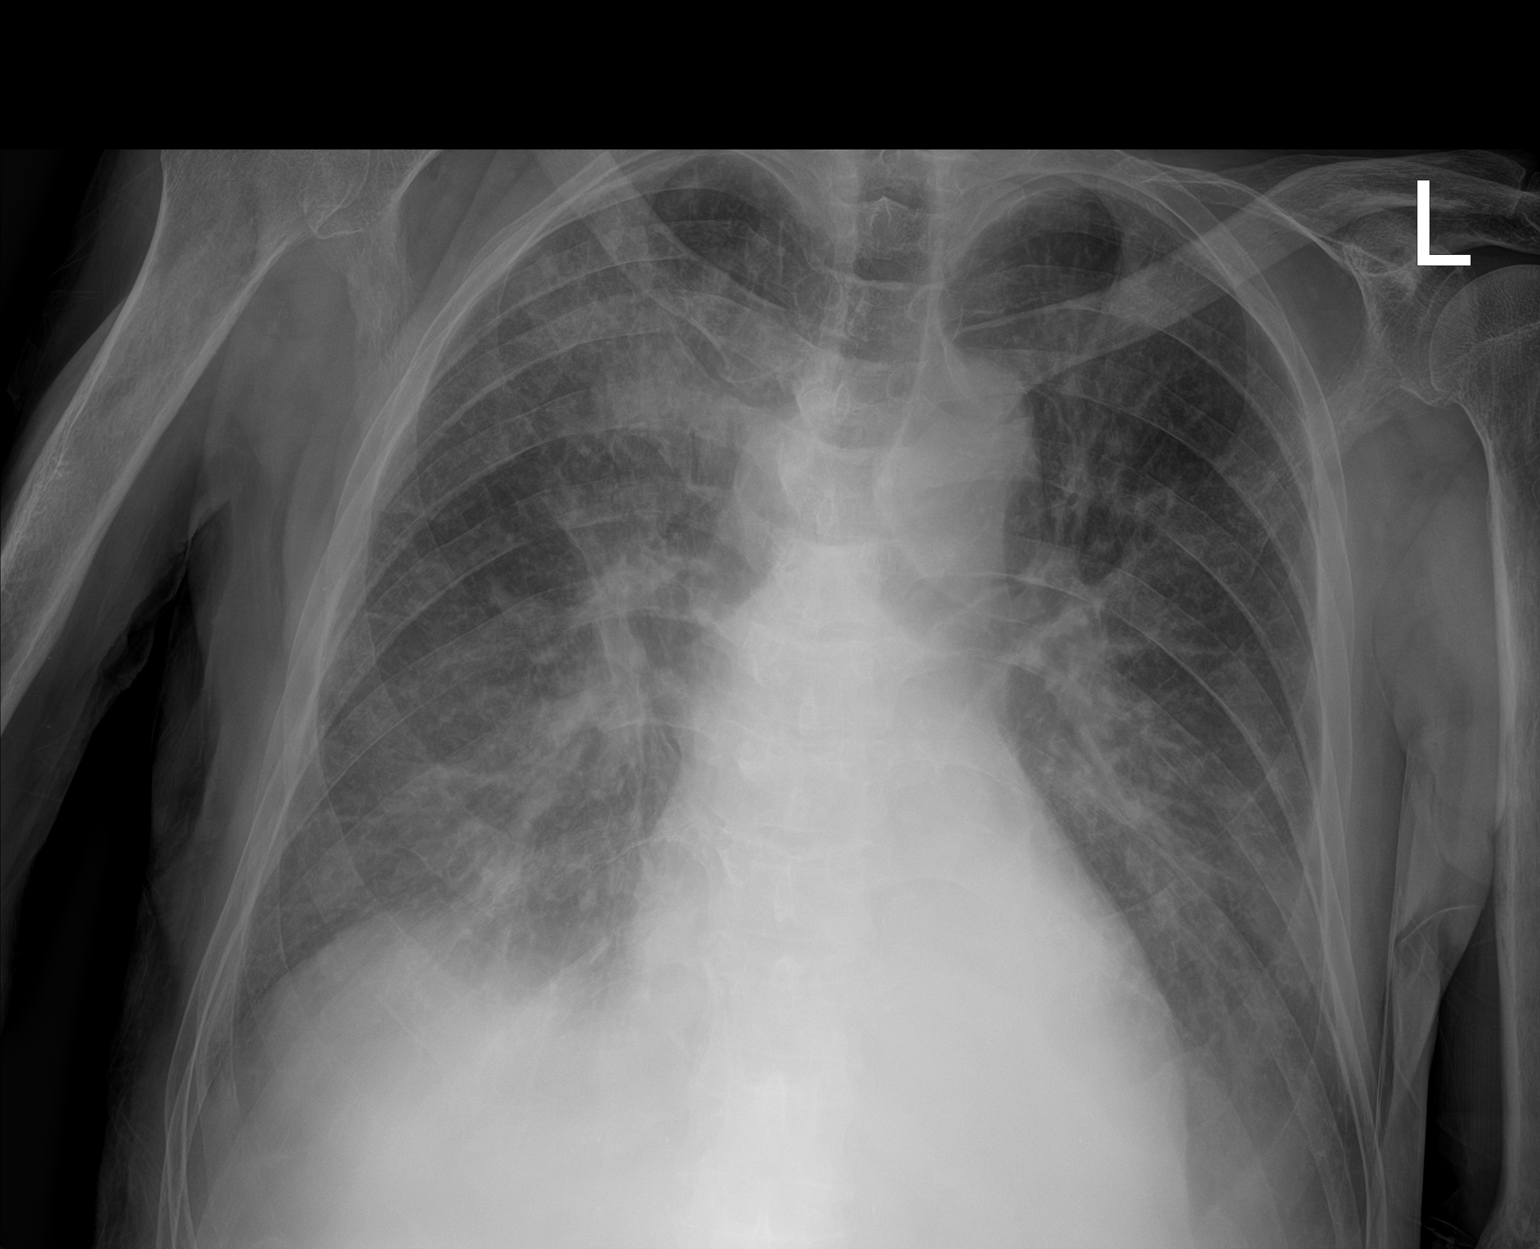

[chest ap (2 of 2)]
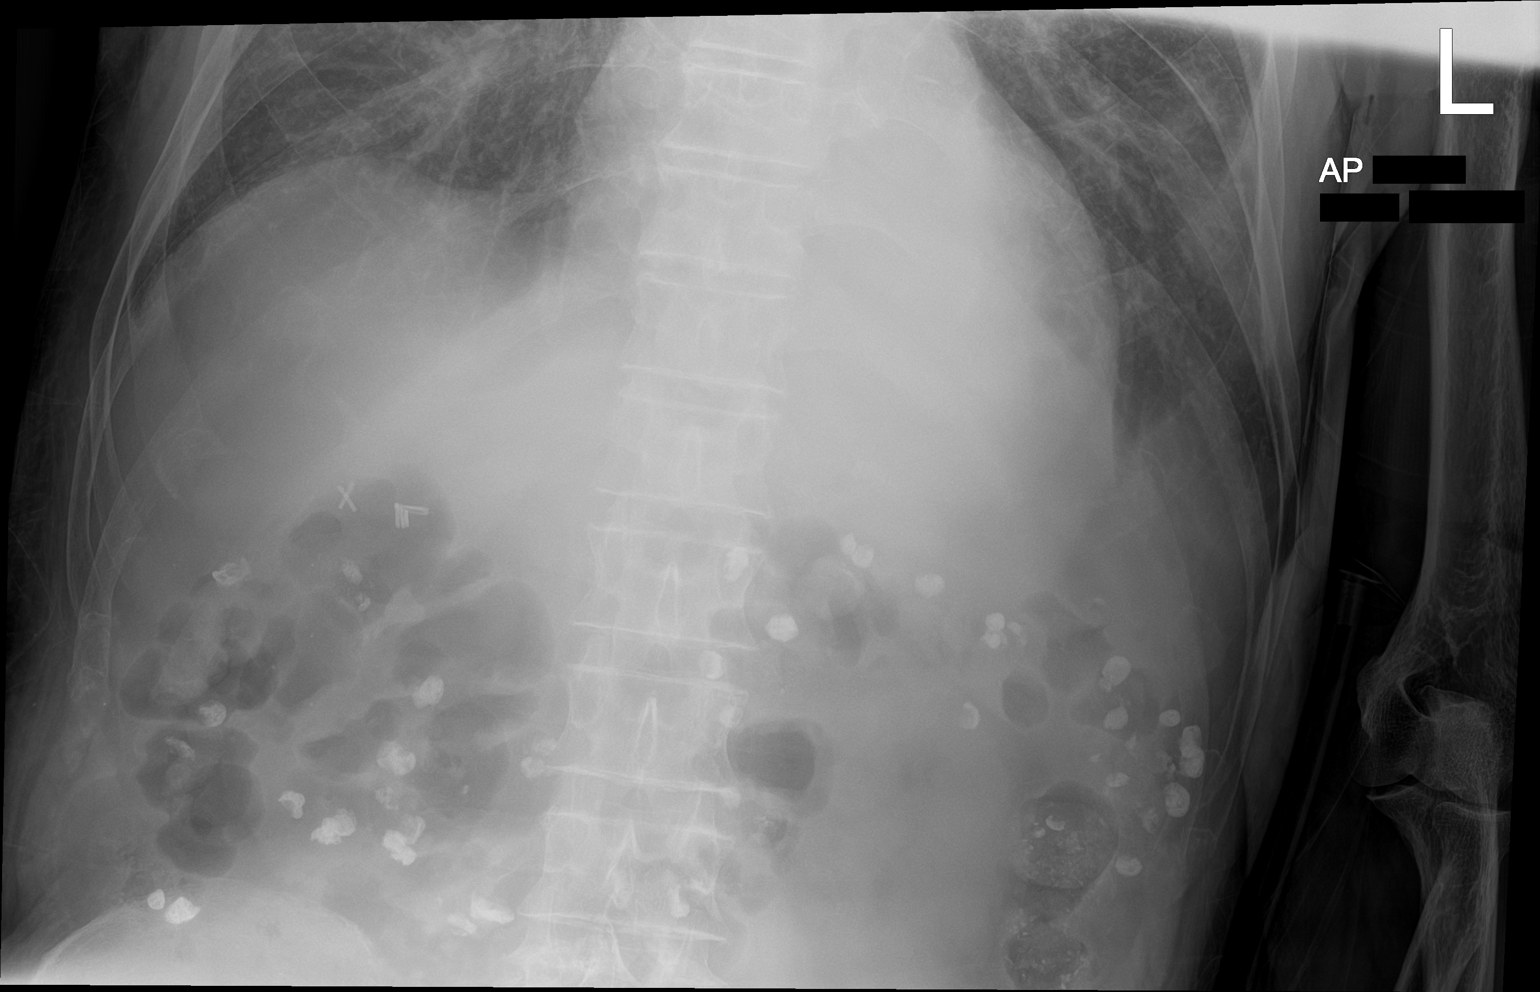

[2 of 2 positions shown; findings below may reference images not displayed]

FINDINGS: Midline trachea. Borderline cardiomegaly. Probable layering
bilateral pleural effusions. No pneumothorax. Interstitial
prominence and indistinctness, slightly increased. Concurrent
right-sided airspace disease, most confluent in the right upper
lobe, slightly increased.
IMPRESSION: Worsened aeration secondary to mildly increased interstitial edema
and primarily right-sided airspace disease. This could represent
alveolar edema or concurrent infection.

Probable layering small bilateral pleural effusions.
